# Patient Record
Sex: Female | Born: 1970 | Race: White | Hispanic: No | Marital: Married | Smoking: Current every day smoker
Health system: Southern US, Community
[De-identification: ages and names within clinical notes are randomized; demographics above are authoritative.]

## PROBLEM LIST (undated history)

## (undated) DIAGNOSIS — R911 Solitary pulmonary nodule: Secondary | ICD-10-CM

## (undated) DIAGNOSIS — R87619 Unspecified abnormal cytological findings in specimens from cervix uteri: Secondary | ICD-10-CM

## (undated) DIAGNOSIS — J45909 Unspecified asthma, uncomplicated: Secondary | ICD-10-CM

## (undated) DIAGNOSIS — F17200 Nicotine dependence, unspecified, uncomplicated: Secondary | ICD-10-CM

## (undated) DIAGNOSIS — Z0000 Encounter for general adult medical examination without abnormal findings: Secondary | ICD-10-CM

## (undated) DIAGNOSIS — Z8669 Personal history of other diseases of the nervous system and sense organs: Secondary | ICD-10-CM

## (undated) DIAGNOSIS — IMO0002 Reserved for concepts with insufficient information to code with codable children: Secondary | ICD-10-CM

## (undated) DIAGNOSIS — Z3009 Encounter for other general counseling and advice on contraception: Secondary | ICD-10-CM

## (undated) DIAGNOSIS — Z136 Encounter for screening for cardiovascular disorders: Secondary | ICD-10-CM

## (undated) DIAGNOSIS — J069 Acute upper respiratory infection, unspecified: Secondary | ICD-10-CM

## (undated) DIAGNOSIS — I2510 Atherosclerotic heart disease of native coronary artery without angina pectoris: Secondary | ICD-10-CM

## (undated) HISTORY — DX: Unspecified asthma, uncomplicated: J45.909

## (undated) HISTORY — DX: Unspecified abnormal cytological findings in specimens from cervix uteri: R87.619

## (undated) HISTORY — DX: Reserved for concepts with insufficient information to code with codable children: IMO0002

## (undated) HISTORY — DX: Nicotine dependence, unspecified, uncomplicated: F17.200

## (undated) HISTORY — DX: Acute upper respiratory infection, unspecified: J06.9

## (undated) HISTORY — DX: Encounter for other general counseling and advice on contraception: Z30.09

## (undated) HISTORY — DX: Personal history of other diseases of the nervous system and sense organs: Z86.69

## (undated) HISTORY — DX: Encounter for screening for cardiovascular disorders: Z13.6

## (undated) HISTORY — DX: Encounter for general adult medical examination without abnormal findings: Z00.00

## (undated) HISTORY — DX: Atherosclerotic heart disease of native coronary artery without angina pectoris: I25.10

---
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
## 1997-10-04 ENCOUNTER — Inpatient Hospital Stay (HOSPITAL_COMMUNITY): Admission: AD | Admit: 1997-10-04 | Discharge: 1997-10-04 | Payer: Self-pay | Admitting: Obstetrics and Gynecology

## 1997-10-16 ENCOUNTER — Inpatient Hospital Stay (HOSPITAL_COMMUNITY): Admission: AD | Admit: 1997-10-16 | Discharge: 1997-10-18 | Payer: Self-pay | Admitting: Obstetrics and Gynecology

## 2001-07-12 ENCOUNTER — Ambulatory Visit (HOSPITAL_COMMUNITY): Admission: RE | Admit: 2001-07-12 | Discharge: 2001-07-12 | Payer: Self-pay

## 2002-08-03 ENCOUNTER — Other Ambulatory Visit: Admission: RE | Admit: 2002-08-03 | Discharge: 2002-08-03 | Payer: Self-pay | Admitting: Internal Medicine

## 2004-07-10 ENCOUNTER — Ambulatory Visit: Payer: Self-pay | Admitting: Internal Medicine

## 2004-08-01 ENCOUNTER — Ambulatory Visit: Payer: Self-pay | Admitting: Neurology

## 2004-12-11 ENCOUNTER — Other Ambulatory Visit: Admission: RE | Admit: 2004-12-11 | Discharge: 2004-12-11 | Payer: Self-pay | Admitting: Obstetrics and Gynecology

## 2005-06-08 ENCOUNTER — Inpatient Hospital Stay (HOSPITAL_COMMUNITY): Admission: AD | Admit: 2005-06-08 | Discharge: 2005-06-08 | Payer: Self-pay | Admitting: Obstetrics and Gynecology

## 2005-06-10 ENCOUNTER — Inpatient Hospital Stay (HOSPITAL_COMMUNITY): Admission: AD | Admit: 2005-06-10 | Discharge: 2005-06-10 | Payer: Self-pay | Admitting: Obstetrics and Gynecology

## 2005-06-15 ENCOUNTER — Inpatient Hospital Stay (HOSPITAL_COMMUNITY): Admission: AD | Admit: 2005-06-15 | Discharge: 2005-06-15 | Payer: Self-pay | Admitting: Obstetrics and Gynecology

## 2005-07-16 ENCOUNTER — Inpatient Hospital Stay (HOSPITAL_COMMUNITY): Admission: AD | Admit: 2005-07-16 | Discharge: 2005-07-18 | Payer: Self-pay | Admitting: Obstetrics and Gynecology

## 2007-09-18 ENCOUNTER — Emergency Department: Payer: Self-pay | Admitting: Emergency Medicine

## 2010-10-11 ENCOUNTER — Other Ambulatory Visit: Payer: Self-pay | Admitting: Family Medicine

## 2010-10-11 ENCOUNTER — Other Ambulatory Visit (HOSPITAL_COMMUNITY)
Admission: RE | Admit: 2010-10-11 | Discharge: 2010-10-11 | Disposition: A | Discharge status: Home / Self Care | Payer: 59 | Source: Ambulatory Visit | Attending: Family Medicine | Admitting: Family Medicine

## 2010-10-11 ENCOUNTER — Encounter: Payer: Self-pay | Admitting: Family Medicine

## 2010-10-11 ENCOUNTER — Ambulatory Visit (INDEPENDENT_AMBULATORY_CARE_PROVIDER_SITE_OTHER): Payer: 59 | Admitting: Family Medicine

## 2010-10-11 ENCOUNTER — Telehealth: Payer: Self-pay | Admitting: Family Medicine

## 2010-10-11 DIAGNOSIS — F17200 Nicotine dependence, unspecified, uncomplicated: Secondary | ICD-10-CM | POA: Insufficient documentation

## 2010-10-11 DIAGNOSIS — Z3009 Encounter for other general counseling and advice on contraception: Secondary | ICD-10-CM

## 2010-10-11 DIAGNOSIS — Z124 Encounter for screening for malignant neoplasm of cervix: Secondary | ICD-10-CM | POA: Insufficient documentation

## 2010-10-11 DIAGNOSIS — Z136 Encounter for screening for cardiovascular disorders: Secondary | ICD-10-CM

## 2010-10-11 DIAGNOSIS — Z1159 Encounter for screening for other viral diseases: Secondary | ICD-10-CM | POA: Insufficient documentation

## 2010-10-11 DIAGNOSIS — Z0000 Encounter for general adult medical examination without abnormal findings: Secondary | ICD-10-CM

## 2010-10-11 LAB — BASIC METABOLIC PANEL
BUN: 10 mg/dL (ref 6–23)
CO2: 30 mEq/L (ref 19–32)
Calcium: 9.7 mg/dL (ref 8.4–10.5)
Chloride: 104 mEq/L (ref 96–112)
Creatinine, Ser: 0.6 mg/dL (ref 0.4–1.2)
GFR: 123.41 mL/min (ref >60.00)
Glucose, Bld: 99 mg/dL (ref 70–99)
Potassium: 4.6 mEq/L (ref 3.5–5.1)
Sodium: 139 mEq/L (ref 135–145)

## 2010-10-11 LAB — HM PAP SMEAR

## 2010-10-11 LAB — LIPID PANEL
Cholesterol: 174 mg/dL (ref 0–200)
HDL: 53.4 mg/dL (ref >39.00)
LDL Cholesterol: 105 mg/dL — ABNORMAL HIGH (ref 0–99)
Total CHOL/HDL Ratio: 3
Triglycerides: 76 mg/dL (ref 0.0–149.0)
VLDL: 15.2 mg/dL (ref 0.0–40.0)

## 2010-10-11 LAB — CONVERTED CEMR LAB: Beta hcg, urine, semiquantitative: NEGATIVE

## 2010-10-15 NOTE — Assessment & Plan Note (Signed)
Summary: NEW PT TO EST/PAP SMEAR/CPX/CLE  [employer],MAILED NPP   Vital Signs:  Patient profile:   40 year old female Height:      69 inches Weight:      140.50 pounds BMI:     20.82 Temp:     97.8 degrees F oral Pulse rate:   68 / minute Pulse rhythm:   regular BP sitting:   110 / 70  (right arm) Cuff size:   regular  Vitals Entered By: Linde Gillis CMA Duncan Dull) (October 11, 2010 11:03 AM) CC: new patient, pap, depo shot   History of Present Illness: 40 yo here to establish care and for CPX/Pap.  G3P3, says she has had multiple abormal pap smears in past, s/p LEEP in 2005.  Never followed up after birth of her last child 5 years ago.  Contraception management- wants Depo provera.  Pt smokes 1 pack of Marlboro lights per day. has tried quitting past, never been successful. Tried patches/gums- made her nauseated. Says that Chantix made her violent and Zyban made her anxious.   Preventive Screening-Counseling & Management  Alcohol-Tobacco     Smoking Status: current      Drug Use:  no.    Current Medications (verified): 1)  None  Allergies (verified): No Known Drug Allergies  Past History:  Family History: Last updated: 10/11/2010 Family History High cholesterol  Social History: Last updated: 10/11/2010 Married Current Smoker Alcohol use-no Drug use-no 3 children  Risk Factors: Smoking Status: current (10/11/2010)  Past Medical History: abnormal pap smear- requiring LEEP in 2005  Past Surgical History: Denies surgical history  Family History: Family History High cholesterol  Social History: Married Current Smoker Alcohol use-no Drug use-no 3 childrenSmoking Status:  current Drug Use:  no  Review of Systems      See HPI General:  Denies malaise. Eyes:  Denies blurring. ENT:  Denies difficulty swallowing. CV:  Denies chest pain or discomfort. Resp:  Denies shortness of breath. GI:  Denies abdominal pain, bloody stools, and change in bowel  habits. GU:  Denies abnormal vaginal bleeding, discharge, and dysuria. MS:  Denies joint pain, joint redness, and joint swelling. Derm:  Denies rash. Neuro:  Denies headaches. Psych:  Denies anxiety and depression. Endo:  Denies cold intolerance and heat intolerance. Heme:  Denies abnormal bruising and bleeding.  Physical Exam  General:  alert, well-developed, and well-nourished.   Head:  normocephalic and no abnormalities observed.   Eyes:  vision grossly intact, pupils equal, pupils round, and pupils reactive to light.   Ears:  R ear normal and L ear normal.   Nose:  no external deformity.   Mouth:  pharynx pink and moist.   Neck:  No deformities, masses, or tenderness noted. Breasts:  No mass, nodules, thickening, tenderness, bulging, retraction, inflamation, nipple discharge or skin changes noted.   Lungs:  Normal respiratory effort, chest expands symmetrically. Lungs are clear to auscultation, no crackles or wheezes. Heart:  Normal rate and regular rhythm. S1 and S2 normal without gallop, murmur, click, rub or other extra sounds. Abdomen:  Bowel sounds positive,abdomen soft and non-tender without masses, organomegaly or hernias noted. Rectal:  no external abnormalities.   Genitalia:  Pelvic Exam:        External: normal female genitalia without lesions or masses        Vagina: normal without lesions or masses        Cervix: normal without lesions or masses        Adnexa:  normal bimanual exam without masses or fullness        Uterus: normal by palpation        Pap smear: performed Msk:  No deformity or scoliosis noted of thoracic or lumbar spine.   Extremities:  no edema Neurologic:  alert & oriented X3 and gait normal.   Skin:  Intact without suspicious lesions or rashes Psych:  Cognition and judgment appear intact. Alert and cooperative with normal attention span and concentration. No apparent delusions, illusions, hallucinations   Impression & Recommendations:  Problem #  1:  HEALTH MAINTENANCE EXAM (ICD-V70.0) Reviewed preventive care protocols, scheduled due services, and updated immunizations Discussed nutrition, exercise, diet, and healthy lifestyle.  Pap smear today. BMET, FLP today. Orders: TLB-BMP (Basic Metabolic Panel-BMET) (80048-METABOL)  Problem # 2:  CONTRACEPTIVE MANAGEMENT (ICD-V25.09) Assessment: Unchanged Discussed risks of hormonal therapy in a smoker above the age of 44.  Pt is willing to take risk of DVT/thromboembolisms.  I explained to her that I will give her one dose of Depo today but none beyond that due to risk.  Will refer to GYN for non hormonal therapy, such as paraguard, BTL. Orders: Urine Pregnancy Test  [id number]) Gynecologic Referral (Gyn)  Other Orders: Venipuncture (60454) TLB-Lipid Panel (80061-LIPID)  Patient Instructions: 1)  Please stop by to see Shirlee Limerick on your way out to set up your gynecology referral.   Orders Added: 1)  Urine Pregnancy Test  [81025] 2)  Venipuncture [36415] 3)  TLB-Lipid Panel [80061-LIPID] 4)  TLB-BMP (Basic Metabolic Panel-BMET) [80048-METABOL] 5)  Gynecologic Referral [Gyn] 6)  New Patient 18-39 years [99385]    Prior Medications (reviewed today): None Current Allergies (reviewed today): No known allergies   Laboratory Results   Urine Tests      Urine HCG: negative    Appended Document: NEW PT TO EST/PAP SMEAR/CPX/CLE  [employer],MAILED NPP     Allergies: No Known Drug Allergies   Other Orders: Depo-Provera 150mg  (U9811) Admin of Therapeutic Inj  intramuscular or subcutaneous (91478)   Medication Administration  Injection # 1:    Medication: Depo-Provera 150mg     Diagnosis: CONTRACEPTIVE MANAGEMENT (ICD-V25.09)    Route: IM    Site: RUOQ gluteus    Exp Date: 10/02/2012    Lot #: [id number]    Mfr: Francisca December    Patient tolerated injection without complications    Given by: Linde Gillis CMA Duncan Dull) (October 11, 2010 11:29 AM)  Orders Added: 1)  Depo-Provera  150mg  [J1055] 2)  Admin of Therapeutic Inj  intramuscular or subcutaneous [96372]     Current Allergies: No known allergies

## 2010-10-16 ENCOUNTER — Encounter: Payer: Self-pay | Admitting: Family Medicine

## 2010-10-22 NOTE — Progress Notes (Signed)
Summary: ? reaction to injection  Phone Note Call from Patient Call back at Home Phone [phone]   Caller: Patient Summary of Call: Pt was given depo shot today around lunch time and about 30 minutes ago her chest broke out and started itching badly.  She is asking if this could be a reaction to the shot.  She says the area is in the middle of her chest, about the size of a golfball.  Please advise.         Lowella Petties CMA, AAMA  October 11, 2010 4:49 PM   Follow-up for Phone Call        could be... but doubt.  if any nausea/vomiting, SOB or throat swelling, should seek urgent medical care.  doubt that will happen though.  could try some benadryl for itch. Follow-up by: Eustaquio Boyden  MD,  October 11, 2010 5:09 PM  Additional Follow-up for Phone Call Additional follow up Details #1::        Tried to call pt, got voicemail, didnt leave a message because it's saturday and the office is closed.    Lowella Petties CMA, AAMA  October 12, 2010 11:43 AM  Pt states she is fine now, took an allergy pill friday evening and the rash went away. Additional Follow-up by: Lowella Petties CMA, AAMA,  October 14, 2010 10:54 AM

## 2010-10-22 NOTE — Letter (Signed)
Summary: Results Follow up Letter  [place] [facility]  [address] [place], [place] [address]   Phone: [phone]  Fax: [phone]    10/16/2010 MRN: [id number]  [facility] [address] [place], [place]  [address]  [place]  Dear Ms. Mimbs,  The following are the results of your recent test(s):  Test         Result    Pap Smear:        Normal __X___  Not Normal _____ Comments: ______________________________________________________ Cholesterol: LDL(Bad cholesterol):         Your goal is less than:         HDL (Good cholesterol):       Your goal is more than: Comments:  ______________________________________________________ Mammogram:        Normal _____  Not Normal _____ Comments:  ___________________________________________________________________ Hemoccult:        Normal _____  Not normal _______ Comments:    _____________________________________________________________________ Other Tests:    We routinely do not discuss normal results over the telephone.  If you desire a copy of the results, or you have any questions about this information we can discuss them at your next office visit.   Sincerely,       Dr. Ruthe Mannan

## 2010-10-24 ENCOUNTER — Ambulatory Visit: Payer: 59 | Admitting: Obstetrics & Gynecology

## 2010-11-05 ENCOUNTER — Ambulatory Visit: Payer: 59 | Admitting: Obstetrics and Gynecology

## 2010-11-05 DIAGNOSIS — Z3009 Encounter for other general counseling and advice on contraception: Secondary | ICD-10-CM

## 2010-11-06 NOTE — Assessment & Plan Note (Signed)
NAMEJAYSA, Makayla NO.:  [id number]  MEDICAL RECORD NO.:  [id number]           PATIENT TYPE:  O  LOCATION:  [facility] at [facility]         FACILITY:  [facility]  PHYSICIAN:  Tinnie Gens, MD        DATE OF BIRTH:  09/28/1970  DATE OF SERVICE:  11/05/2010                                 CLINIC NOTE  CHIEF COMPLAINT:  Birth control consult.  HISTORY OF PRESENT ILLNESS:  The patient is a 40 year old gravida 3, para 3, who is referred from Dr. Ruthe Mannan for birth control consult. The patient has previously been on Depo-Provera 8 years prior to the birth of her last son, which was 5 years ago.  Since that time, she has really been doing nothing for birth control.  She went to see Dr. Dayton Martes for a yearly exam, and she had a Pap smear that was normal and was referred here because she is over 71 and a smoker for birth control consultation.  She did receive a Depo-Provera shot at the time of her visit there.  When she is on Depo, she has regular cycles that come monthly with medium flow.  She gets no cycle when she is on Depo.  PAST MEDICAL HISTORY:  Asthma, mild.  PAST SURGICAL HISTORY:  Negative.  MEDICATIONS:  Depo-Provera 150 mg IM q.3 months.  ALLERGIES:  PENICILLIN.  OBSTETRICAL HISTORY:  G3, P3, three vaginal deliveries.  GYN HISTORY:  History of abnormal Pap with LEEP in 1995, three normal pregnancies after that, and the last Pap was normal.  FAMILY HISTORY:  Negative for diabetes; heart disease; hypertension; breast, ovarian, or uterine cancer.  SOCIAL HISTORY:  The patient is a homemaker.  She has three kids and her husband at home.  She does smoke approximately 1 pack per day for the last 20 years.  She denies other alcohol or drug use.  REVIEW OF SYSTEMS:  A 14-point review of systems reviewed.  Please see GYN history in the chart, but is negative.  PHYSICAL EXAMINATION:  VITAL SIGNS:  Today, vitals are as noted in the chart. GENERAL:  The patient is a  well-developed and well-nourished female in no acute distress. ABDOMEN:  Soft, nontender, and nondistended. HEENT:  Normocephalic and atraumatic.  Sclerae are without icterus.  She has normal respiratory rise and fall. HEART:  Rate is regular. EXTREMITIES:  No cyanosis, clubbing, or edema.  IMPRESSION:  Birth control consult on a 40 year old smoker.  PLAN:  Lengthy discussion was had with the patient as well up to date and CDC, MMWR.  There is no contraindication to be ever 36 smoking and Depo-Provera.  The patient likes just birth control method tolerates it well.  We will continue to offer this to the patient.  The patient does have contraindications to combine oral contraceptives and would only have contraindication to Depo-Provera or progestin only containing birth control options because she had a personal history of breast cancer or had a significant cardiovascular history or significant three more risk factors for cardiac disease including age, sex, smoking, diabetes, and hypertension.  This patient does not have any of these issues, and so Depo should be fine for her.  If she likes  it, we will continue this for her.          ______________________________ Tinnie Gens, MD    TP/MEDQ  D:  11/05/2010  T:  11/06/2010  Job:  [id number]  cc:   Dianne Dun, MD [facility]

## 2011-01-01 ENCOUNTER — Ambulatory Visit: Payer: 59

## 2011-01-01 ENCOUNTER — Ambulatory Visit (INDEPENDENT_AMBULATORY_CARE_PROVIDER_SITE_OTHER): Payer: 59 | Admitting: Family Medicine

## 2011-01-01 DIAGNOSIS — Z309 Encounter for contraceptive management, unspecified: Secondary | ICD-10-CM

## 2011-01-01 MED ORDER — MEDROXYPROGESTERONE ACETATE 150 MG/ML IM SUSP
150.0000 mg | Freq: Once | INTRAMUSCULAR | Status: AC
  Administered 2011-01-01: 150 mg via INTRAMUSCULAR

## 2011-01-01 NOTE — Progress Notes (Signed)
Depo provera injection given today during nurse visit.

## 2011-03-20 ENCOUNTER — Ambulatory Visit (INDEPENDENT_AMBULATORY_CARE_PROVIDER_SITE_OTHER): Payer: 59 | Admitting: *Deleted

## 2011-03-20 DIAGNOSIS — Z309 Encounter for contraceptive management, unspecified: Secondary | ICD-10-CM

## 2011-03-20 MED ORDER — MEDROXYPROGESTERONE ACETATE 150 MG/ML IM SUSP
150.0000 mg | Freq: Once | INTRAMUSCULAR | Status: AC
  Administered 2011-03-20: 150 mg via INTRAMUSCULAR

## 2011-03-20 NOTE — Progress Notes (Signed)
  Subjective:    Patient ID: Makayla Green, female    DOB: 01-Feb-1971, 40 y.o.   MRN: [id number]  HPI    Review of Systems     Objective:   Physical Exam        Assessment & Plan:  Patient received depo shot at nurse visit today

## 2011-06-09 ENCOUNTER — Ambulatory Visit (INDEPENDENT_AMBULATORY_CARE_PROVIDER_SITE_OTHER): Payer: 59 | Admitting: *Deleted

## 2011-06-09 DIAGNOSIS — Z309 Encounter for contraceptive management, unspecified: Secondary | ICD-10-CM

## 2011-06-09 MED ORDER — MEDROXYPROGESTERONE ACETATE 150 MG/ML IM SUSP
150.0000 mg | Freq: Once | INTRAMUSCULAR | Status: AC
  Administered 2011-06-09: 150 mg via INTRAMUSCULAR

## 2011-06-09 NOTE — Progress Notes (Signed)
Depo provera given during nurse visit today.  Patient should return between 08/25/2011-09/08/2011 for next injection.

## 2011-06-10 ENCOUNTER — Ambulatory Visit: Payer: 59

## 2012-06-24 ENCOUNTER — Encounter: Payer: Self-pay | Admitting: Family Medicine

## 2012-06-28 ENCOUNTER — Ambulatory Visit (INDEPENDENT_AMBULATORY_CARE_PROVIDER_SITE_OTHER): Payer: 59 | Admitting: Family Medicine

## 2012-06-28 ENCOUNTER — Ambulatory Visit (INDEPENDENT_AMBULATORY_CARE_PROVIDER_SITE_OTHER)
Admission: RE | Admit: 2012-06-28 | Discharge: 2012-06-28 | Disposition: A | Discharge status: Home / Self Care | Payer: 59 | Source: Ambulatory Visit | Attending: Family Medicine | Admitting: Family Medicine

## 2012-06-28 ENCOUNTER — Encounter: Payer: Self-pay | Admitting: Family Medicine

## 2012-06-28 VITALS — BP 104/64 | HR 72 | Temp 98.0°F | Wt 142.0 lb

## 2012-06-28 DIAGNOSIS — M25559 Pain in unspecified hip: Secondary | ICD-10-CM

## 2012-06-28 DIAGNOSIS — Z309 Encounter for contraceptive management, unspecified: Secondary | ICD-10-CM

## 2012-06-28 DIAGNOSIS — M25551 Pain in right hip: Secondary | ICD-10-CM

## 2012-06-28 DIAGNOSIS — Z23 Encounter for immunization: Secondary | ICD-10-CM

## 2012-06-28 MED ORDER — MEDROXYPROGESTERONE ACETATE 150 MG/ML IM SUSP
150.0000 mg | Freq: Once | INTRAMUSCULAR | Status: AC
  Administered 2012-06-28: 150 mg via INTRAMUSCULAR

## 2012-06-28 NOTE — Patient Instructions (Addendum)
Good to see you. We will call you with your xray results.   Aleve and/or tylenol as needed for pain. Hip injection may be beneficial to help with pain and to decrease inflammation. Start physical therapy with transition to home exercise program.

## 2012-06-28 NOTE — Progress Notes (Signed)
  Subjective:    Patient ID: Makayla Green, female    DOB: Dec 23, 1970, 41 y.o.   MRN: [id number]  HPI  41 yo here to discuss right hip pain on and off for several years.  No known injury but has had this issue since she gave birth to her son several years ago.  She can reproduce pain by pressing on outside of her hip.  Sometimes radiates down her leg, typically does not.  No LE weakness.  She would also like to restart Depo provera today.  Patient Active Problem List  Diagnosis  . TOBACCO ABUSE  . Right hip pain   Past Medical History  Diagnosis Date  . Other general counseling and advice for contraceptive management   . Routine general medical examination at a health care facility   . Screening for ischemic heart disease   . Tobacco use disorder   . Abnormal pap     LEEP 2005   No past surgical history on file. History  Substance Use Topics  . Smoking status: Current Every Day Smoker  . Smokeless tobacco: Not on file  . Alcohol Use: No   No family history on file. Allergies  Allergen Reactions  . Penicillins Shortness Of Breath and Swelling   No current outpatient prescriptions on file prior to visit.   No current facility-administered medications on file prior to visit.   The PMH, PSH, Social History, Family History, Medications, and allergies have been reviewed in [facility], and have been updated if relevant.   Review of Systems See HPI    Objective:   Physical Exam BP 104/64  Pulse 72  Temp 98 F (36.7 C)  Wt 142 lb (64.411 kg)  LMP 06/14/2012  General:  Well-developed,well-nourished,in no acute distress; alert,appropriate and cooperative throughout examination Head:  normocephalic and atraumatic.   Extremities:  No clubbing, cyanosis, edema, or deformity noted with normal full range of motion of all joints.   No pain with external or internal rotation of right hip, SLR neg bilaterally. She is TTP over right trochanteric bursa Neurologic:  alert &  oriented X3 and gait normal.   Skin:  Intact without suspicious lesions or rashes Psych:  Cognition and judgment appear intact. Alert and cooperative with normal attention span and concentration. No apparent delusions, illusions, hallucinations      Assessment & Plan:   1. Right hip pain  Intermittent- consistent with trochanteric bursitis.  Will get hip film today. Given handout from sports med advisor discussing course, expected duration and treatment.  She is not interested in cortisone injections.   DG Hip Complete Right  2. Contraceptive management  She is a smoker- no contraindication to progesterone only hormones in smokers above 40 yo, so I am ok with her continuing Depo but explained why she would not be a candidate for combination hormonal therapy (CV risks.) The patient indicates understanding of these issues and agrees with the plan.  POCT urine pregnancy, medroxyPROGESTERone (DEPO-PROVERA) injection 150 mg  3. Need for prophylactic vaccination and inoculation against influenza  Flu vaccine greater than or equal to 3yo preservative free IM

## 2012-10-05 ENCOUNTER — Ambulatory Visit: Payer: 59

## 2013-02-15 ENCOUNTER — Ambulatory Visit: Payer: 59

## 2013-02-18 ENCOUNTER — Ambulatory Visit: Payer: 59 | Admitting: Family Medicine

## 2013-02-22 ENCOUNTER — Ambulatory Visit: Payer: 59 | Admitting: Family Medicine

## 2013-02-24 ENCOUNTER — Encounter: Payer: Self-pay | Admitting: Family Medicine

## 2013-02-24 ENCOUNTER — Ambulatory Visit (INDEPENDENT_AMBULATORY_CARE_PROVIDER_SITE_OTHER): Payer: 59 | Admitting: Family Medicine

## 2013-02-24 VITALS — BP 100/80 | HR 68 | Temp 98.0°F | Ht 69.0 in | Wt 141.0 lb

## 2013-02-24 DIAGNOSIS — Z309 Encounter for contraceptive management, unspecified: Secondary | ICD-10-CM | POA: Insufficient documentation

## 2013-02-24 LAB — POCT URINE PREGNANCY: Preg Test, Ur: NEGATIVE

## 2013-02-24 MED ORDER — MEDROXYPROGESTERONE ACETATE 150 MG/ML IM SUSP
150.0000 mg | Freq: Once | INTRAMUSCULAR | Status: AC
  Administered 2013-02-24: 150 mg via INTRAMUSCULAR

## 2013-02-24 NOTE — Progress Notes (Signed)
  Subjective:    Patient ID: Makayla Green, female    DOB: 1971/03/26, 42 y.o.   MRN: [id number]  HPI 42 yo here to discuss contraception.  She was receiving depo provera injections (she is a smoker) but had not come back since her last injection in 06/2012.  She had no periods while she was receiving the injections, no noticeable side effects.  She would like to restart it.  Last pap smear normal in 10/2010.  Patient Active Problem List   Diagnosis Date Noted  . Contraception management 02/24/2013  . TOBACCO ABUSE 10/11/2010   Past Medical History  Diagnosis Date  . Other general counseling and advice for contraceptive management   . Routine general medical examination at a health care facility   . Screening for ischemic heart disease   . Tobacco use disorder   . Abnormal pap     LEEP 2005   No past surgical history on file. History  Substance Use Topics  . Smoking status: Current Every Day Smoker  . Smokeless tobacco: Not on file  . Alcohol Use: No   No family history on file. Allergies  Allergen Reactions  . Penicillins Shortness Of Breath and Swelling   No current outpatient prescriptions on file prior to visit.   No current facility-administered medications on file prior to visit.   The PMH, PSH, Social History, Family History, Medications, and allergies have been reviewed in [facility], and have been updated if relevant.    Review of Systems    See HPI Objective:   Physical Exam BP 100/80  Pulse 68  Temp(Src) 98 F (36.7 C)  Ht 5\' 9"  (1.753 m)  Wt 141 lb (63.957 kg)  BMI 20.81 kg/m2  General:  Well-developed,well-nourished,in no acute distress; alert,appropriate and cooperative throughout examination Head:  normocephalic and atraumatic.   Eyes:  vision grossly intact, pupils equal, pupils round, and pupils reactive to light.   Ears:  R ear normal and L ear normal.   Nose:  no external deformity.   Mouth:  good dentition.   Neck:  No deformities,  masses, or tenderness noted Neurologic:  alert & oriented X3 and gait normal.   Skin:  Intact without suspicious lesions or rashes Psych:  Cognition and judgment appear intact. Alert and cooperative with normal attention span and concentration. No apparent delusions, illusions, hallucinations      Assessment & Plan:  1. Contraception management U preg neg. Depo provera IM given today.

## 2013-03-31 ENCOUNTER — Ambulatory Visit: Payer: 59 | Admitting: Family Medicine

## 2013-05-12 ENCOUNTER — Ambulatory Visit (INDEPENDENT_AMBULATORY_CARE_PROVIDER_SITE_OTHER): Payer: 59 | Admitting: *Deleted

## 2013-05-12 DIAGNOSIS — Z309 Encounter for contraceptive management, unspecified: Secondary | ICD-10-CM

## 2013-05-12 DIAGNOSIS — IMO0001 Reserved for inherently not codable concepts without codable children: Secondary | ICD-10-CM

## 2013-05-12 MED ORDER — MEDROXYPROGESTERONE ACETATE 150 MG/ML IM SUSP
150.0000 mg | Freq: Once | INTRAMUSCULAR | Status: AC
  Administered 2013-05-12: 150 mg via INTRAMUSCULAR

## 2013-08-12 ENCOUNTER — Ambulatory Visit (INDEPENDENT_AMBULATORY_CARE_PROVIDER_SITE_OTHER): Payer: 59

## 2013-08-12 DIAGNOSIS — Z309 Encounter for contraceptive management, unspecified: Secondary | ICD-10-CM

## 2013-08-12 LAB — POCT URINE PREGNANCY: Preg Test, Ur: NEGATIVE

## 2013-08-26 ENCOUNTER — Ambulatory Visit (INDEPENDENT_AMBULATORY_CARE_PROVIDER_SITE_OTHER): Payer: 59

## 2013-08-26 DIAGNOSIS — Z309 Encounter for contraceptive management, unspecified: Secondary | ICD-10-CM

## 2013-08-26 MED ORDER — MEDROXYPROGESTERONE ACETATE 150 MG/ML IM SUSP
150.0000 mg | Freq: Once | INTRAMUSCULAR | Status: AC
  Administered 2013-08-26: 150 mg via INTRAMUSCULAR

## 2013-11-15 ENCOUNTER — Ambulatory Visit: Payer: 59

## 2013-11-15 ENCOUNTER — Ambulatory Visit (INDEPENDENT_AMBULATORY_CARE_PROVIDER_SITE_OTHER): Payer: 59 | Admitting: *Deleted

## 2013-11-15 DIAGNOSIS — Z309 Encounter for contraceptive management, unspecified: Secondary | ICD-10-CM

## 2013-11-15 MED ORDER — MEDROXYPROGESTERONE ACETATE 150 MG/ML IM SUSP
150.0000 mg | Freq: Once | INTRAMUSCULAR | Status: AC
  Administered 2013-11-15: 150 mg via INTRAMUSCULAR

## 2014-02-02 ENCOUNTER — Ambulatory Visit (INDEPENDENT_AMBULATORY_CARE_PROVIDER_SITE_OTHER): Payer: 59

## 2014-02-02 DIAGNOSIS — Z309 Encounter for contraceptive management, unspecified: Secondary | ICD-10-CM

## 2014-02-02 MED ORDER — MEDROXYPROGESTERONE ACETATE 150 MG/ML IM SUSP
150.0000 mg | Freq: Once | INTRAMUSCULAR | Status: AC
  Administered 2014-02-02: 150 mg via INTRAMUSCULAR

## 2014-03-20 ENCOUNTER — Ambulatory Visit (INDEPENDENT_AMBULATORY_CARE_PROVIDER_SITE_OTHER)
Admission: RE | Admit: 2014-03-20 | Discharge: 2014-03-20 | Disposition: A | Discharge status: Home / Self Care | Payer: 59 | Source: Ambulatory Visit | Attending: Family Medicine | Admitting: Family Medicine

## 2014-03-20 ENCOUNTER — Encounter: Payer: Self-pay | Admitting: Family Medicine

## 2014-03-20 ENCOUNTER — Ambulatory Visit (INDEPENDENT_AMBULATORY_CARE_PROVIDER_SITE_OTHER): Payer: 59 | Admitting: Family Medicine

## 2014-03-20 VITALS — BP 106/62 | HR 88 | Temp 98.2°F | Ht 67.0 in | Wt 140.5 lb

## 2014-03-20 DIAGNOSIS — R61 Generalized hyperhidrosis: Secondary | ICD-10-CM | POA: Insufficient documentation

## 2014-03-20 DIAGNOSIS — F17200 Nicotine dependence, unspecified, uncomplicated: Secondary | ICD-10-CM

## 2014-03-20 LAB — CBC WITH DIFFERENTIAL/PLATELET
BASOS PCT: 0.4 % (ref 0.0–3.0)
Basophils Absolute: 0.1 10*3/uL (ref 0.0–0.1)
Eosinophils Absolute: 0.1 10*3/uL (ref 0.0–0.7)
Eosinophils Relative: 0.5 % (ref 0.0–5.0)
HCT: 44.9 % (ref 36.0–46.0)
HEMOGLOBIN: 14.9 g/dL (ref 12.0–15.0)
LYMPHS PCT: 16.6 % (ref 12.0–46.0)
Lymphs Abs: 2.8 10*3/uL (ref 0.7–4.0)
MCHC: 33.1 g/dL (ref 30.0–36.0)
MCV: 95.8 fl (ref 78.0–100.0)
Monocytes Absolute: 0.9 10*3/uL (ref 0.1–1.0)
Monocytes Relative: 5.5 % (ref 3.0–12.0)
NEUTROS PCT: 77 % (ref 43.0–77.0)
Neutro Abs: 12.9 10*3/uL — ABNORMAL HIGH (ref 1.4–7.7)
Platelets: 373 10*3/uL (ref 150.0–400.0)
RBC: 4.69 Mil/uL (ref 3.87–5.11)
RDW: 13.6 % (ref 11.5–15.5)
WBC: 16.7 10*3/uL — ABNORMAL HIGH (ref 4.0–10.5)

## 2014-03-20 LAB — T4, FREE: Free T4: 1.29 ng/dL (ref 0.60–1.60)

## 2014-03-20 LAB — TSH: TSH: 0.34 u[IU]/mL — ABNORMAL LOW (ref 0.35–4.50)

## 2014-03-20 LAB — HEMOGLOBIN A1C: HEMOGLOBIN A1C: 6.1 % (ref 4.6–6.5)

## 2014-03-20 NOTE — Progress Notes (Signed)
   Subjective:   Patient ID: Makayla Green, female    DOB: [birth year], 43 y.o.   MRN: [id number]  Makayla Green is a pleasant 43 y.o. year old female whom I have not seen in over a year, presents to clinic today with "not feeling right" and Hot Flashes and Night Sweats  on 03/20/2014  HPI:  Past month, she has been waking up in the middle of the night drenched in sweat several nights per week.  Has been receiving IM depo provera q 3 months for contraception (she is a smoker).  Not having periods since restarting this.  Last dose received here on 02/02/14.  Has a cough in the morning but this is unchanged.  No hemoptysis.  Did report left sided peripheral blurred vision last week but that resolved.  Appetite good. Wt Readings from Last 3 Encounters:  03/20/14 140 lb 8 oz (63.73 kg)  02/24/13 141 lb (63.957 kg)  06/28/12 142 lb (64.411 kg)   No fevers.  Of note, father was exposed to agent orange and she is concerned about this.  Current Outpatient Prescriptions on File Prior to Visit  Medication Sig Dispense Refill  . fexofenadine-pseudoephedrine (ALLEGRA-D 24) 180-240 MG per 24 hr tablet Take 1 tablet by mouth daily.       No current facility-administered medications on file prior to visit.    Allergies  Allergen Reactions  . Penicillins Shortness Of Breath and Swelling    Past Medical History  Diagnosis Date  . Other general counseling and advice for contraceptive management   . Routine general medical examination at a health care facility   . Screening for ischemic heart disease   . Tobacco use disorder   . Abnormal pap     LEEP 2005    No past surgical history on file.  No family history on file.  History   Social History  . Marital Status: Single    Spouse Name: N/A    Number of Children: 3  . Years of Education: N/A   Occupational History  . Not on file.   Social History Main Topics  . Smoking status: Current Every Day Smoker  . Smokeless  tobacco: Not on file  . Alcohol Use: No  . Drug Use: No  . Sexual Activity: Not on file   Other Topics Concern  . Not on file   Social History Narrative  . No narrative on file   The PMH, PSH, Social History, Family History, Medications, and allergies [facility], and have been updated if relevant.   Review of Systems + tingling in hands No nausea or vomiting No day time hot flashes No headaches No extremity weakness +intermittent resting tremor (bilateral) No increased thirst or urination No CP or SOB No blood in stool + more "moodiness"  Denies feeling depressed or anxious    Objective:    BP 106/62  Pulse 88  Temp(Src) 98.2 F (36.8 C) (Oral)  Ht 5\' 7"  (1.702 m)  Wt 140 lb 8 oz (63.73 kg)  BMI 22.00 kg/m2  SpO2 98%   Physical Exam  Gen:  Alert, NAD HEENT:  Mild thyromegaly Resp:CTA bilaterally  CVS: RRR Abd: soft, NT Neuro:  +resting tremor, right > left  Psych:  Good eye contact, not anxious or depressed appearing Lymph:  No cervical lymphadenopathy      Assessment & Plan:   Night sweats No Follow-up on file.

## 2014-03-20 NOTE — Assessment & Plan Note (Signed)
New- unclear etiology with very wide differential diagnosis. She is having some symptoms of hyperthyroidism.  Will route out thyroid dysfunction with TSH and FT4 today. Also would like to rule out diabetes and malignancy. CBC, a1c and CXR given long term tobacco use. The patient indicates understanding of these issues and agrees with the plan.  Orders Placed This Encounter  Procedures  . DG Chest 2 View  . TSH  . CBC with Differential  . T4, Free  . Hemoglobin A1c  . Comprehensive metabolic panel

## 2014-03-20 NOTE — Patient Instructions (Signed)
Good to see you. I will call you with your results from today. Please let me know if you develop any new symptoms.

## 2014-03-20 NOTE — Progress Notes (Signed)
Pre visit review using our clinic review tool, if applicable. No additional management support is needed unless otherwise documented below in the visit note. 

## 2014-03-21 ENCOUNTER — Encounter: Payer: Self-pay | Admitting: Family Medicine

## 2014-03-21 LAB — COMPREHENSIVE METABOLIC PANEL
ALT: 12 U/L (ref 0–35)
AST: 21 U/L (ref 0–37)
Albumin: 4.4 g/dL (ref 3.5–5.2)
Alkaline Phosphatase: 61 U/L (ref 39–117)
BUN: 11 mg/dL (ref 6–23)
CALCIUM: 9.8 mg/dL (ref 8.4–10.5)
CHLORIDE: 107 meq/L (ref 96–112)
CO2: 17 meq/L — AB (ref 19–32)
Creatinine, Ser: 0.9 mg/dL (ref 0.4–1.2)
GFR: 72.12 mL/min (ref >60.00)
Glucose, Bld: 92 mg/dL (ref 70–99)
Potassium: 5.1 mEq/L (ref 3.5–5.1)
SODIUM: 142 meq/L (ref 135–145)
TOTAL PROTEIN: 7.8 g/dL (ref 6.0–8.3)
Total Bilirubin: 0.5 mg/dL (ref 0.2–1.2)

## 2014-03-23 ENCOUNTER — Encounter: Payer: Self-pay | Admitting: Family Medicine

## 2014-03-31 ENCOUNTER — Other Ambulatory Visit: Payer: Self-pay | Admitting: Family Medicine

## 2014-03-31 DIAGNOSIS — D72829 Elevated white blood cell count, unspecified: Secondary | ICD-10-CM

## 2014-04-07 ENCOUNTER — Other Ambulatory Visit (INDEPENDENT_AMBULATORY_CARE_PROVIDER_SITE_OTHER): Payer: 59

## 2014-04-07 DIAGNOSIS — D72829 Elevated white blood cell count, unspecified: Secondary | ICD-10-CM

## 2014-04-07 LAB — CBC WITH DIFFERENTIAL/PLATELET
BASOS ABS: 0.1 10*3/uL (ref 0.0–0.1)
Basophils Relative: 0.4 % (ref 0.0–3.0)
Eosinophils Absolute: 0.1 10*3/uL (ref 0.0–0.7)
Eosinophils Relative: 0.7 % (ref 0.0–5.0)
HCT: 42 % (ref 36.0–46.0)
HEMOGLOBIN: 14.3 g/dL (ref 12.0–15.0)
Lymphocytes Relative: 26.1 % (ref 12.0–46.0)
Lymphs Abs: 3.1 10*3/uL (ref 0.7–4.0)
MCHC: 34.1 g/dL (ref 30.0–36.0)
MCV: 93.4 fl (ref 78.0–100.0)
MONOS PCT: 6.5 % (ref 3.0–12.0)
Monocytes Absolute: 0.8 10*3/uL (ref 0.1–1.0)
NEUTROS ABS: 7.8 10*3/uL — AB (ref 1.4–7.7)
Neutrophils Relative %: 66.3 % (ref 43.0–77.0)
PLATELETS: 329 10*3/uL (ref 150.0–400.0)
RBC: 4.5 Mil/uL (ref 3.87–5.11)
RDW: 13.6 % (ref 11.5–15.5)
WBC: 11.7 10*3/uL — ABNORMAL HIGH (ref 4.0–10.5)

## 2014-04-20 ENCOUNTER — Encounter: Payer: Self-pay | Admitting: Family Medicine

## 2014-04-20 ENCOUNTER — Ambulatory Visit (INDEPENDENT_AMBULATORY_CARE_PROVIDER_SITE_OTHER): Payer: 59 | Admitting: Family Medicine

## 2014-04-20 ENCOUNTER — Ambulatory Visit: Payer: 59

## 2014-04-20 VITALS — BP 110/68 | HR 99 | Temp 98.2°F | Wt 143.5 lb

## 2014-04-20 DIAGNOSIS — R7989 Other specified abnormal findings of blood chemistry: Secondary | ICD-10-CM

## 2014-04-20 DIAGNOSIS — E0590 Thyrotoxicosis, unspecified without thyrotoxic crisis or storm: Secondary | ICD-10-CM | POA: Insufficient documentation

## 2014-04-20 DIAGNOSIS — R946 Abnormal results of thyroid function studies: Secondary | ICD-10-CM

## 2014-04-20 DIAGNOSIS — Z308 Encounter for other contraceptive management: Secondary | ICD-10-CM

## 2014-04-20 DIAGNOSIS — R61 Generalized hyperhidrosis: Secondary | ICD-10-CM

## 2014-04-20 DIAGNOSIS — D72829 Elevated white blood cell count, unspecified: Secondary | ICD-10-CM

## 2014-04-20 LAB — CBC WITH DIFFERENTIAL/PLATELET
Basophils Absolute: 0 10*3/uL (ref 0.0–0.1)
Basophils Relative: 0.2 % (ref 0.0–3.0)
EOS PCT: 0.7 % (ref 0.0–5.0)
Eosinophils Absolute: 0.1 10*3/uL (ref 0.0–0.7)
HCT: 46.4 % — ABNORMAL HIGH (ref 36.0–46.0)
HEMOGLOBIN: 15.6 g/dL — AB (ref 12.0–15.0)
LYMPHS ABS: 3.6 10*3/uL (ref 0.7–4.0)
LYMPHS PCT: 23.6 % (ref 12.0–46.0)
MCHC: 33.6 g/dL (ref 30.0–36.0)
MCV: 95.7 fl (ref 78.0–100.0)
MONOS PCT: 6.6 % (ref 3.0–12.0)
Monocytes Absolute: 1 10*3/uL (ref 0.1–1.0)
NEUTROS ABS: 10.7 10*3/uL — AB (ref 1.4–7.7)
Neutrophils Relative %: 68.9 % (ref 43.0–77.0)
Platelets: 380 10*3/uL (ref 150.0–400.0)
RBC: 4.85 Mil/uL (ref 3.87–5.11)
RDW: 14 % (ref 11.5–15.5)
WBC: 15.5 10*3/uL — AB (ref 4.0–10.5)

## 2014-04-20 LAB — T4, FREE: Free T4: 1.07 ng/dL (ref 0.60–1.60)

## 2014-04-20 LAB — TSH: TSH: 0.37 u[IU]/mL (ref 0.35–4.50)

## 2014-04-20 MED ORDER — MEDROXYPROGESTERONE ACETATE 150 MG/ML IM SUSP
150.0000 mg | Freq: Once | INTRAMUSCULAR | Status: AC
  Administered 2014-04-20: 150 mg via INTRAMUSCULAR

## 2014-04-20 NOTE — Addendum Note (Signed)
Addended by: Desmond Dike on: 04/20/2014 11:01 AM   Modules accepted: Orders

## 2014-04-20 NOTE — Assessment & Plan Note (Signed)
Improving but remained elevated. Will recheck CBC today.

## 2014-04-20 NOTE — Progress Notes (Signed)
Pre visit review using our clinic review tool, if applicable. No additional management support is needed unless otherwise documented below in the visit note. 

## 2014-04-20 NOTE — Assessment & Plan Note (Signed)
Repeat thyroid panel today.

## 2014-04-20 NOTE — Patient Instructions (Signed)
Great to see you. Please schedule your physical/pap smear.

## 2014-04-20 NOTE — Progress Notes (Signed)
Subjective:   Patient ID: Makayla Green, female    DOB: [birth year], 43 y.o.   MRN: [id number]  Makayla Green is a pleasant 43 y.o. year old female whom I have not seen in over a year, presents to clinic today with "not feeling right" and Follow-up  on 04/20/2014  HPI:  Here for follow up.  Past month, she has been waking up in the middle of the night drenched in sweat several nights per week.  Has been receiving IM depo provera q 3 months for contraception (she is a smoker).  Not having periods since restarting this.  Last dose received here on 02/02/14.  Has a cough in the morning but this is unchanged.  No hemoptysis. CXR was neg.  EXAM:  CHEST 2 VIEW  COMPARISON: None.  FINDINGS:  The heart size and mediastinal contours are within normal limits.  Both lungs are clear. The visualized skeletal structures are  unremarkable.  IMPRESSION:  Normal chest.  Electronically Signed  By: Geanie Cooley M.D.  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  Appetite good. Wt Readings from Last 3 Encounters:  04/20/14 143 lb 8 oz (65.091 kg)  03/20/14 140 lb 8 oz (63.73 kg)  02/24/13 141 lb (63.957 kg)   No fevers.  CBC was elevated but trending down. Lab Results  Component Value Date   WBC 11.7* 04/07/2014   HGB 14.3 04/07/2014   HCT 42.0 04/07/2014   MCV 93.4 04/07/2014   PLT 329.0 04/07/2014   Lab Results  Component Value Date   TSH 0.34* 03/20/2014     Current Outpatient Prescriptions on File Prior to Visit  Medication Sig Dispense Refill  . fexofenadine-pseudoephedrine (ALLEGRA-D 24) 180-240 MG per 24 hr tablet Take 1 tablet by mouth daily.       No current facility-administered medications on file prior to visit.    Allergies  Allergen Reactions  . Penicillins Shortness Of Breath and Swelling    Past Medical  History  Diagnosis Date  . Other general counseling and advice for contraceptive management   . Routine general medical examination at a health care facility   . Screening for ischemic heart disease   . Tobacco use disorder   . Abnormal pap     LEEP 2005    No past surgical history on file.  No family history on file.  History   Social History  . Marital Status: Single    Spouse Name: N/A    Number of Children: 3  . Years of Education: N/A   Occupational History  . Not on file.   Social History Main Topics  . Smoking status: Current Every Day Smoker  . Smokeless tobacco: Not on file  . Alcohol Use: No  . Drug Use: No  . Sexual Activity: Not on file   Other Topics Concern  . Not on file   Social History Narrative  . No narrative on file   The PMH, PSH, Social History, Family History, Medications, and allergies have been reviewed in [facility], and have been updated if relevant.   Review of Systems + tingling in hands No nausea or vomiting No day time hot flashes No headaches No extremity weakness No increased thirst or urination No CP or SOB No blood in stool No bone pain  Objective:    BP 110/68  Pulse 99  Temp(Src) 98.2 F (36.8 C) (Oral)  Wt 143 lb 8 oz (65.091 kg)  SpO2 97%   Physical Exam  Gen:  Alert, NAD HEENT:  Mild thyromegaly Resp:CTA bilaterally  CVS: RRR Abd: soft, NT Psych:  Good eye contact, not anxious or depressed appearing Lymph:  No cervical lymphadenopathy      Assessment & Plan:   Leukocytosis - Plan: CBC with Differential  Night sweats  Encounter for other contraceptive management No Follow-up on file.

## 2014-04-20 NOTE — Assessment & Plan Note (Signed)
Persistent. Overdue for pap smear- she wants to schedule this for another day. Also discussed ? Early menopause.  Would need to stop depo injections to evaluate.  She does not want to do this yet. Call or return to clinic prn if these symptoms worsen or fail to improve as anticipated. The patient indicates understanding of these issues and agrees with the plan.

## 2014-04-21 ENCOUNTER — Other Ambulatory Visit: Payer: Self-pay | Admitting: Family Medicine

## 2014-04-21 DIAGNOSIS — D72829 Elevated white blood cell count, unspecified: Secondary | ICD-10-CM

## 2014-04-27 ENCOUNTER — Ambulatory Visit: Payer: 59 | Admitting: Family Medicine

## 2014-05-11 ENCOUNTER — Other Ambulatory Visit (HOSPITAL_COMMUNITY)
Admission: RE | Admit: 2014-05-11 | Discharge: 2014-05-11 | Disposition: A | Discharge status: Home / Self Care | Payer: 59 | Source: Ambulatory Visit | Attending: Family Medicine | Admitting: Family Medicine

## 2014-05-11 ENCOUNTER — Encounter: Payer: Self-pay | Admitting: Family Medicine

## 2014-05-11 ENCOUNTER — Ambulatory Visit (INDEPENDENT_AMBULATORY_CARE_PROVIDER_SITE_OTHER): Payer: 59 | Admitting: Family Medicine

## 2014-05-11 VITALS — BP 102/66 | HR 91 | Temp 98.2°F | Wt 145.5 lb

## 2014-05-11 DIAGNOSIS — F329 Major depressive disorder, single episode, unspecified: Secondary | ICD-10-CM

## 2014-05-11 DIAGNOSIS — Z01419 Encounter for gynecological examination (general) (routine) without abnormal findings: Secondary | ICD-10-CM | POA: Diagnosis present

## 2014-05-11 DIAGNOSIS — Z1231 Encounter for screening mammogram for malignant neoplasm of breast: Secondary | ICD-10-CM

## 2014-05-11 DIAGNOSIS — Z23 Encounter for immunization: Secondary | ICD-10-CM

## 2014-05-11 DIAGNOSIS — F418 Other specified anxiety disorders: Secondary | ICD-10-CM

## 2014-05-11 DIAGNOSIS — Z1151 Encounter for screening for human papillomavirus (HPV): Secondary | ICD-10-CM | POA: Insufficient documentation

## 2014-05-11 DIAGNOSIS — F32A Depression, unspecified: Secondary | ICD-10-CM

## 2014-05-11 DIAGNOSIS — F419 Anxiety disorder, unspecified: Secondary | ICD-10-CM

## 2014-05-11 MED ORDER — SERTRALINE HCL 25 MG PO TABS: 25.0000 mg | ORAL_TABLET | Freq: Every day | ORAL | Status: DC

## 2014-05-11 NOTE — Assessment & Plan Note (Signed)
Pap smear done today. Mammogram ordered- given breast center contact information- she will call to set this up.

## 2014-05-11 NOTE — Assessment & Plan Note (Signed)
New- progressive. Discussed tx options. She is refusing psychotherapy. Will start low dose SSRI- eRx sent for zoloft 25 mg daily. Follow up in 3 weeks.

## 2014-05-11 NOTE — Progress Notes (Signed)
Pre visit review using our clinic review tool, if applicable. No additional management support is needed unless otherwise documented below in the visit note. 

## 2014-05-11 NOTE — Patient Instructions (Signed)
Great to see you. Please set up your mammogram. We are starting zoloft 25 mg daily. Call me or come see me in 3 weeks to update me.

## 2014-05-11 NOTE — Progress Notes (Signed)
  Subjective:     Flo L Marland McalpineZagurski is a 43 y.o. woman who comes in today for a  pap smear only. Her most recent annual exam and pap smear was on 10/11/10 (done by me which was normal). Previous abnormal Pap smears: yes - had a LEEP in 2005.Marland Kitchen. Contraception: Depo-Provera injections. Not having regular periods.  Has never had a mammogram.  Would also like to discuss worsening anxiety, depression and irritability. Feels she has less patience for everyone lately.  She is tired, not sleeping well. No panic attacks.  No SI or HI.  Has never taken an SSRI.  Did have an adverse rxn to amitriptyline years ago for migraine prophylaxis and did have hallucinations- " I thought my walls were talking to me."  Current Outpatient Prescriptions on File Prior to Visit  Medication Sig Dispense Refill  . fexofenadine-pseudoephedrine (ALLEGRA-D 24) 180-240 MG per 24 hr tablet Take 1 tablet by mouth daily.       No current facility-administered medications on file prior to visit.    Allergies  Allergen Reactions  . Penicillins Shortness Of Breath and Swelling  . Amitriptyline     Hallucinations    Past Medical History  Diagnosis Date  . Other general counseling and advice for contraceptive management   . Routine general medical examination at a health care facility   . Screening for ischemic heart disease   . Tobacco use disorder   . Abnormal pap     LEEP 2005    No past surgical history on file.  No family history on file.  History   Social History  . Marital Status: Single    Spouse Name: N/A    Number of Children: 3  . Years of Education: N/A   Occupational History  . Not on file.   Social History Main Topics  . Smoking status: Current Every Day Smoker  . Smokeless tobacco: Not on file  . Alcohol Use: No  . Drug Use: No  . Sexual Activity: Not on file   Other Topics Concern  . Not on file   Social History Narrative  . No narrative on file   The PMH, PSH, Social History,  Family History, Medications, and allergies [facility], and have been updated if relevant.    Review of Systems See HPI Genitourinary:negative for genital lesions, sexual problems and vaginal discharge   Objective:    BP 102/66  Pulse 91  Temp(Src) 98.2 F (36.8 C) (Oral)  Wt 145 lb 8 oz (65.998 kg)  SpO2 98% Pelvic Exam: cervix normal in appearance, external genitalia normal and vagina normal without discharge. Pap smear obtained.  Psych:  Appears anxious today, not depressed appearing Good eye contact.

## 2014-05-12 ENCOUNTER — Ambulatory Visit: Payer: Self-pay | Admitting: Oncology

## 2014-05-12 ENCOUNTER — Telehealth: Payer: Self-pay | Admitting: Family Medicine

## 2014-05-12 LAB — CBC CANCER CENTER
BASOS ABS: 0.1 x10 3/mm (ref 0.0–0.1)
BASOS PCT: 0.9 %
EOS PCT: 0.9 %
Eosinophil #: 0.1 x10 3/mm (ref 0.0–0.7)
HCT: 45.8 % (ref 35.0–47.0)
HGB: 14.8 g/dL (ref 12.0–16.0)
Lymphocyte #: 2.6 x10 3/mm (ref 1.0–3.6)
Lymphocyte %: 22.2 %
MCH: 31.6 pg (ref 26.0–34.0)
MCHC: 32.4 g/dL (ref 32.0–36.0)
MCV: 97 fL (ref 80–100)
MONO ABS: 1.1 x10 3/mm — AB (ref 0.2–0.9)
Monocyte %: 9.7 %
NEUTROS ABS: 7.8 x10 3/mm — AB (ref 1.4–6.5)
NEUTROS PCT: 66.3 %
PLATELETS: 326 x10 3/mm (ref 150–440)
RBC: 4.7 10*6/uL (ref 3.80–5.20)
RDW: 13.9 % (ref 11.5–14.5)
WBC: 11.7 x10 3/mm — AB (ref 3.6–11.0)

## 2014-05-12 LAB — LACTATE DEHYDROGENASE: LDH: 146 U/L (ref 81–246)

## 2014-05-12 LAB — CYTOLOGY - PAP

## 2014-05-12 NOTE — Telephone Encounter (Signed)
emmi emailed °

## 2014-05-15 ENCOUNTER — Encounter: Payer: Self-pay | Admitting: *Deleted

## 2014-05-18 ENCOUNTER — Telehealth: Payer: Self-pay | Admitting: Family Medicine

## 2014-05-18 NOTE — Telephone Encounter (Signed)
I'm not quite sure what is causing it.  I just received her note from hematology today which was reassuring.  Perhaps we should look into something rheumatological?  If she is willing, we could do more labs like sed rate, crp, etc

## 2014-05-18 NOTE — Telephone Encounter (Signed)
Spoke with patient and she said the fever isn't new and that she has talked to Dr. Dayton MartesAron about it. She said she feels comfortable waiting to hear what she has to say.

## 2014-05-18 NOTE — Telephone Encounter (Signed)
Is fever new? I don't see that she's had fever in past. If new, would recommend evaluation. Otherwise may await PCP input. Will also route to PCP.

## 2014-05-18 NOTE — Telephone Encounter (Signed)
Patient Information:  Caller Name: Jaquelin  Phone: [phone]  Patient: Makayla Green, Makayla Green  Gender: Female  DOB: 07/25/1971  Age: [age] Years  PCP: Ruthe MannanAron, [facility](Family Practice)  Pregnant: No  Office Follow Up:  Does the office need to follow up with this patient?: Yes  Instructions For The Office: Please see note with pt's question and follow up wtih pt.   Symptoms  Reason For Call & Symptoms: 05/18/14 feels like legs and arms weigh about 500 pounds, achy like when getting the flu although has no cold sxs.  C/o headache, rates 2/10.    1430 Temp 101 Axillary.   Taking Tylenol/Motrin which doesn't really help with 'feeling like crap'.  These same sxs have been ongoing x 3 months where pt will have intermittent fever and aches.  Dr Dayton MartesAron has ran tests and Pt had testing done 05/12/14 at [facility].   Pt was calling today due to achiness is worse than normal, advised pt to see MD, but she is hesitant to see another MD due to 'everything going on' and then gettng another doctor in the middle of it.   Pt requests if a note can be sent to MD covering for Dr Dayton MartesAron to see if any further recommendations or if she should just continue to monitor sxs.  Reviewed Health History In EMR: Yes  Reviewed Medications In EMR: Yes  Reviewed Allergies In EMR: Yes  Reviewed Surgeries / Procedures: Yes  Date of Onset of Symptoms: 04/18/2014  Treatments Tried: Tylenol Motrin  Treatments Tried Worked: No  Any Fever: Yes  Fever Taken: Axillary  Fever Time Of Reading: 14:30:00  Fever Last Reading: 101.0 OB / GYN:  LMP: 04/18/2014  Guideline(s) Used:  Fever  Disposition Per Guideline:   See Today or Tomorrow in Office  Reason For Disposition Reached:   Intermittent fever > 100.5 F persists > 3 weeks  Advice Given:  N/A  Patient Will Follow Care Advice:  YES

## 2014-05-19 NOTE — Telephone Encounter (Signed)
That's great news.  Thanks for the updated.

## 2014-05-19 NOTE — Telephone Encounter (Signed)
Spoke to pt and advised per Dr Dayton MartesAron. Pt states that she is "back to normal this morning. The fever is completely gone." Pt states that if the s/s return she will make a f/u appt

## 2014-05-24 ENCOUNTER — Ambulatory Visit (INDEPENDENT_AMBULATORY_CARE_PROVIDER_SITE_OTHER): Payer: 59 | Admitting: Family Medicine

## 2014-05-24 ENCOUNTER — Encounter: Payer: Self-pay | Admitting: Family Medicine

## 2014-05-24 VITALS — BP 118/68 | HR 91 | Temp 98.1°F | Wt 142.0 lb

## 2014-05-24 DIAGNOSIS — R509 Fever, unspecified: Secondary | ICD-10-CM

## 2014-05-24 DIAGNOSIS — M791 Myalgia, unspecified site: Secondary | ICD-10-CM | POA: Insufficient documentation

## 2014-05-24 LAB — MONONUCLEOSIS SCREEN: Mono Screen: NEGATIVE

## 2014-05-24 MED ORDER — DOXYCYCLINE HYCLATE 100 MG PO TABS: 100.0000 mg | ORAL_TABLET | Freq: Two times a day (BID) | ORAL | Status: DC

## 2014-05-24 NOTE — Progress Notes (Signed)
Subjective:   Patient ID: Makayla Green, female    DOB: 1970-12-01, 43 y.o.   MRN: [id number]  Makayla Green is a pleasant 43 y.o. year old female whom I have not seen in over a year, presents to clinic today with "not feeling right" and Follow-up  on 05/24/2014  HPI:  Here for follow up. See previous OV for summary of this issue.  Night sweats have resolved.  Thought her symptoms had resolved but then spiked a temp this past week of 101.3.  Also still very fatigued.    Long term smoker.  CXR neg.     EXAM:  CHEST 2 VIEW  COMPARISON: None.  FINDINGS:  The heart size and mediastinal contours are within normal limits.  Both lungs are clear. The visualized skeletal structures are  unremarkable.  IMPRESSION:  Normal chest.  Electronically Signed  By: Geanie CooleyJim Maxwell M.D.  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  Appetite good. Wt Readings from Last 3 Encounters:  05/24/14 142 lb (64.411 kg)  05/11/14 145 lb 8 oz (65.998 kg)  04/20/14 143 lb 8 oz (65.091 kg)   No fevers.  CBC was elevated but trending down.  Referred to hematology- note reviewed- advised observation. Lab Results  Component Value Date   WBC 15.5* 04/20/2014   HGB 15.6* 04/20/2014   HCT 46.4* 04/20/2014   MCV 95.7 04/20/2014   PLT 380.0 04/20/2014   Lab Results  Component Value Date   TSH 0.37 04/20/2014     Current Outpatient Prescriptions on File Prior to Visit  Medication Sig Dispense Refill  . fexofenadine-pseudoephedrine (ALLEGRA-D 24) 180-240 MG per 24 hr tablet Take 1 tablet by mouth daily.      . sertraline (ZOLOFT) 25 MG tablet Take 1 tablet (25 mg total) by mouth daily.  30 tablet  1   No current facility-administered medications on file prior to visit.    Allergies  Allergen Reactions  . Penicillins Shortness Of Breath and  Swelling  . Amitriptyline     Hallucinations    Past Medical History  Diagnosis Date  . Other general counseling and advice for contraceptive management   . Routine general medical examination at a health care facility   . Screening for ischemic heart disease   . Tobacco use disorder   . Abnormal pap     LEEP 2005    No past surgical history on file.  No family history on file.  History   Social History  . Marital Status: Married    Spouse Name: N/A    Number of Children: 3  . Years of Education: N/A   Occupational History  . Not on file.   Social History Main Topics  . Smoking status: Current Every Day Smoker -- 1.00 packs/day    Types: Cigarettes  . Smokeless tobacco: Not on file  . Alcohol Use: No  . Drug Use: No  . Sexual Activity: Not on file   Other Topics Concern  . Not on file   Social History Narrative  . No narrative on file   The PMH, PSH, Social History, Family History, Medications, and allergies have been [facility], and have been updated if relevant.   Review of Systems + fatigue No nausea or vomiting No headaches No extremity weakness No increased thirst  or urination No CP or SOB No blood in stool No bone pain     Objective:    BP 118/68  Pulse 91  Temp(Src) 98.1 F (36.7 C) (Oral)  Wt 142 lb (64.411 kg)  SpO2 99%   Physical Exam  Nursing note and vitals reviewed. Constitutional: He appears well-developed and well-nourished. No distress.  HENT:  Head: Normocephalic.  Neck: Normal range of motion. Neck supple.  Cardiovascular: Normal rate and regular rhythm.   Pulmonary/Chest: Effort normal and breath sounds normal. No respiratory distress.  Lymphadenopathy:    He has no cervical adenopathy.  Psychiatric: He has a normal mood and affect. His behavior is normal. Judgment and thought content normal.          Assessment & Plan:   Other specified fever - Plan: Rocky mtn spotted fvr abs pnl(IgG+IgM), Lyme Disease Ab,  Quant, IgM, Mononucleosis screen, Epstein-Barr virus VCA, IgG  Myalgia No Follow-up on file.

## 2014-05-24 NOTE — Progress Notes (Signed)
Pre visit review using our clinic review tool, if applicable. No additional management support is needed unless otherwise documented below in the visit note. 

## 2014-05-24 NOTE — Patient Instructions (Signed)
Take doxycyline 100 mg twice daily x 10 days. I will call you with your lab results.

## 2014-05-24 NOTE — Assessment & Plan Note (Signed)
Persistent and intermittent. Etiology still remains unclear. She declined tick bites previously but now states perhaps she did have some.  No rashes. Will check RMSF and Lyme titers along with EBV and mono spot. Start on Doxycyline 100 mg twice daily.

## 2014-05-25 ENCOUNTER — Telehealth: Payer: Self-pay | Admitting: Family Medicine

## 2014-05-25 LAB — EPSTEIN-BARR VIRUS VCA, IGG: EBV VCA IgG: >750 U/mL — ABNORMAL HIGH (ref <18.0)

## 2014-05-25 LAB — B. BURGDORFI ANTIBODIES: B burgdorferi Ab IgG+IgM: 0.34 {ISR}

## 2014-05-25 NOTE — Telephone Encounter (Signed)
emmi emailed °

## 2014-05-25 NOTE — Telephone Encounter (Signed)
Pt returned your call about lab results 

## 2014-05-26 ENCOUNTER — Other Ambulatory Visit: Payer: Self-pay | Admitting: Family Medicine

## 2014-05-26 DIAGNOSIS — B2700 Gammaherpesviral mononucleosis without complication: Secondary | ICD-10-CM

## 2014-05-26 LAB — ROCKY MTN SPOTTED FVR ABS PNL(IGG+IGM)
RMSF IgG: 0.19 IV
RMSF IgM: 0.21 IV

## 2014-05-26 NOTE — Telephone Encounter (Signed)
Returned her call again- no voicemail picked up this time so I sent her a [facility] message.

## 2014-06-04 ENCOUNTER — Ambulatory Visit: Payer: Self-pay | Admitting: Oncology

## 2014-06-07 ENCOUNTER — Ambulatory Visit: Payer: Self-pay

## 2014-06-14 ENCOUNTER — Other Ambulatory Visit: Payer: Self-pay | Admitting: Family Medicine

## 2014-06-14 MED ORDER — DOXYCYCLINE HYCLATE 100 MG PO TABS: 100.0000 mg | ORAL_TABLET | Freq: Two times a day (BID) | ORAL | Status: DC

## 2014-06-14 MED ORDER — VARENICLINE TARTRATE 0.5 MG X 11 & 1 MG X 42 PO MISC: ORAL | Status: DC

## 2014-06-22 ENCOUNTER — Encounter: Payer: Self-pay | Admitting: Family Medicine

## 2014-07-06 ENCOUNTER — Encounter: Payer: Self-pay | Admitting: Pulmonary Disease

## 2014-07-06 ENCOUNTER — Ambulatory Visit (INDEPENDENT_AMBULATORY_CARE_PROVIDER_SITE_OTHER): Payer: 59 | Admitting: Pulmonary Disease

## 2014-07-06 VITALS — BP 108/68 | HR 114 | Ht 67.0 in | Wt 151.0 lb

## 2014-07-06 DIAGNOSIS — J432 Centrilobular emphysema: Secondary | ICD-10-CM | POA: Insufficient documentation

## 2014-07-06 DIAGNOSIS — F17200 Nicotine dependence, unspecified, uncomplicated: Secondary | ICD-10-CM

## 2014-07-06 DIAGNOSIS — Z720 Tobacco use: Secondary | ICD-10-CM

## 2014-07-06 DIAGNOSIS — R911 Solitary pulmonary nodule: Secondary | ICD-10-CM

## 2014-07-06 HISTORY — DX: Solitary pulmonary nodule: R91.1

## 2014-07-06 MED ORDER — FLUTTER DEVI: 1.0000 | Freq: Every day | Status: DC

## 2014-07-06 NOTE — Patient Instructions (Signed)
Start using Breo daily  Quit smoking Use a flutter valve 5-10 breaths 3 times per day Take mucinex twice per day Give the three mucus samples to Dr. Sampson GoonFitzgerald We will collect blood work on you in one week in the [place] office We will see you back in 2-3 weeks

## 2014-07-06 NOTE — Assessment & Plan Note (Signed)
The this nodule is benign in appearance because it is so small, it needs attention and she should have a repeat CT chest in 12 months. She is a high-risk patient considering her smoking history.

## 2014-07-06 NOTE — Assessment & Plan Note (Signed)
Advised at length to quit. Discussed various options including Chantix and Wellbutrin. She is not ready to use medical therapy at this time.

## 2014-07-06 NOTE — Assessment & Plan Note (Signed)
I agree with the workup conducted by infectious disease up until this point. I have recommended that she stay off of antibiotics for several weeks and that we can test her for atypical mycobacterial respiratory infection.

## 2014-07-06 NOTE — Progress Notes (Signed)
Subjective:    Patient ID: Makayla Green, female    DOB: 05/10/1971, 43 y.o.   MRN: [id number]  HPI  Makayla Green is here to see me today for some cough and and abnormal CT chest.  She was referred by her PCP Dr. Dayton MartesAron for the same.  She sees Dr. Sampson GoonFitzgerald with infectious diseases.  She says that as a child she had a lot of asthma and was hospitalized frequently and received a lot of antibiotics as a child.  She stays that for her entire life she has been on antihistamines for sinus congestion and sometimes has asthma symptoms (as an adult).  Unfortunately she started smoking as an adult.  She says that in June she started having a lot of sinus symptoms and then bronchitis after the pollen came out.  She says that she also tried switching to the electronic cigarette at this time.  Sh ehad a lot of mucus in her chest and had to get treated with antibiotics.  She really didn't get much better over several weeks and required antibiotics again in August for the same symptoms.  She said that she had a lot of night sweats.  She continues to have night sweats now and continues to have a lot of body aches.  Her weight has remained stable this year.  She has been treated with biaxin several times and a zpack on two separate occassions.  She most recently was treated with biaxin as of a week ago.  She just finished prdnisone this year.    Before 2015 her asthma was fairly well controlled since age 89 (her last hospitalization).  She would have to go to the doctor maybe 1-2 time per year for allergies and possibly bronchitis which was always easy to treat.  She started smoking age 43 and continues to smoke.  She is down to 1/2 pack per day but she was smoking as much as 2 ppd recently.  She has chantix but hasn't started.   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  She works in a garden regularly.  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    She continues to struggle with  chest congestion this week.  Most recently she had completed antibiotics as recent as 5 days ago.  However about 3 days prior to this visit she started to have more chest and sinus congestion.  She feels like there is a large ball of mucus in her chest.  She is not short of breath and she is not wheezing now, but this was more prominent in the past this year. She was prescribed antibiotics and has had respiratory cultures.    She does not take symbicort even though she has it.    Past Medical History  Diagnosis Date  . Other general counseling and advice for contraceptive management   . Routine general medical examination at a health care facility   . Screening for ischemic heart disease   . Tobacco use disorder   . Abnormal pap     LEEP 2005     Family History  Problem Relation Age of Onset  . COPD Mother      History   Social History  . Marital Status: Married    Spouse Name: N/A    Number of Children: 3  . Years of Education: N/A   Occupational History  . Not on file.   Social History Main Topics  .  Smoking status: Current Every Day Smoker -- 1.00 packs/day for 25 years    Types: Cigarettes  . Smokeless tobacco: Never Used  . Alcohol Use: No  . Drug Use: No  . Sexual Activity: Not on file   Other Topics Concern  . Not on file   Social History Narrative     Allergies  Allergen Reactions  . Penicillins Shortness Of Breath and Swelling  . Amitriptyline     Hallucinations     Outpatient Prescriptions Prior to Visit  Medication Sig Dispense Refill  . fexofenadine-pseudoephedrine (ALLEGRA-D 24) 180-240 MG per 24 hr tablet Take 1 tablet by mouth daily.    Marland Kitchen. ibuprofen (ADVIL,MOTRIN) 200 MG tablet Take 800 mg by mouth every 4 (four) hours as needed.    . varenicline (CHANTIX STARTING MONTH PAK) 0.5 MG X 11 & 1 MG X 42 tablet Take one 0.5 mg tablet by mouth once daily for 3 days, then increase to one 0.5 mg tablet twice daily for 4 days, then increase to one 1 mg  tablet twice daily. (Patient not taking: Reported on 07/06/2014) 53 tablet 0  . doxycycline (VIBRA-TABS) 100 MG tablet Take 1 tablet (100 mg total) by mouth 2 (two) times daily. (Patient not taking: Reported on 07/06/2014) 20 tablet 0  . sertraline (ZOLOFT) 25 MG tablet Take 1 tablet (25 mg total) by mouth daily. (Patient not taking: Reported on 07/06/2014) 30 tablet 1   No facility-administered medications prior to visit.      Review of Systems  Constitutional: Negative for fever and unexpected weight change.  HENT: Negative for congestion, dental problem, ear pain, nosebleeds, postnasal drip, rhinorrhea, sinus pressure, sneezing, sore throat and trouble swallowing.   Eyes: Negative for redness and itching.  Respiratory: Positive for cough and shortness of breath. Negative for chest tightness.   Cardiovascular: Negative for palpitations and leg swelling.  Gastrointestinal: Negative for nausea and vomiting.  Genitourinary: Negative for dysuria.  Musculoskeletal: Negative for joint swelling.  Skin: Negative for rash.  Neurological: Negative for headaches.  Hematological: Does not bruise/bleed easily.  Psychiatric/Behavioral: Negative for dysphoric mood. The patient is not nervous/anxious.        Objective:   Physical Exam Filed Vitals:   07/06/14 1522  BP: 108/68  Pulse: 114  Height: 5\' 7"  (1.702 m)  Weight: 151 lb (68.493 kg)  SpO2: 100%   RA  Gen: well appearing, no acute distress HEENT: NCAT, PERRL, EOMi, OP clear, neck supple without masses PULM: CTA B CV: RRR, no mgr, no JVD AB: BS+, soft, nontender, no hsm Ext: warm, no edema, no clubbing, no cyanosis Derm: no rash or skin breakdown Neuro: A&Ox4, CN II-XII intact, strength 5/5 in all 4 extremities        Assessment & Plan:   Centrilobular emphysema She has mild airflow obstruction but  Significant  Emphysema on the November 2015 CT chest.  This is clearly due to her ongoing tobacco use.   I explained to her  today that her cough, and mucus per clearly related to her COPD. I agree with the infectious disease physician that she should be worked up for atypical mycobacterial disease. Considering the decline in her symptoms in the last year it is also reasonable to test her for allergic bronchopulmonary aspergillosis.  Plan:  -the hallmark of therapy here should be that she quit smoking cigarettes  -I educated her at length that she needs to use Breo daily no matter how she feels -Check Ige, Aspergillus antigens (IgE  specific)  Fever  I agree with the workup conducted by infectious disease up until this point. I have recommended that she stay off of antibiotics for several weeks and that we can test her for atypical mycobacterial respiratory infection.  Solitary pulmonary nodule  The this nodule is benign in appearance because it is so small, it needs attention and she should have a repeat CT chest in 12 months. She is a high-risk patient considering her smoking history.  TOBACCO ABUSE  Advised at length to quit. Discussed various options including Chantix and Wellbutrin. She is not ready to use medical therapy at this time.    Updated Medication List Outpatient Encounter Prescriptions as of 07/06/2014  Medication Sig  . fexofenadine-pseudoephedrine (ALLEGRA-D 24) 180-240 MG per 24 hr tablet Take 1 tablet by mouth daily.  Marland Kitchen ibuprofen (ADVIL,MOTRIN) 200 MG tablet Take 800 mg by mouth every 4 (four) hours as needed.  Marland Kitchen Respiratory Therapy Supplies (FLUTTER) DEVI 1 Device by Does not apply route daily.  . varenicline (CHANTIX STARTING MONTH PAK) 0.5 MG X 11 & 1 MG X 42 tablet Take one 0.5 mg tablet by mouth once daily for 3 days, then increase to one 0.5 mg tablet twice daily for 4 days, then increase to one 1 mg tablet twice daily. (Patient not taking: Reported on 07/06/2014)  . [DISCONTINUED] doxycycline (VIBRA-TABS) 100 MG tablet Take 1 tablet (100 mg total) by mouth 2 (two) times daily. (Patient not  taking: Reported on 07/06/2014)  . [DISCONTINUED] sertraline (ZOLOFT) 25 MG tablet Take 1 tablet (25 mg total) by mouth daily. (Patient not taking: Reported on 07/06/2014)

## 2014-07-06 NOTE — Assessment & Plan Note (Signed)
She has mild airflow obstruction but  Significant  Emphysema on the November 2015 CT chest.  This is clearly due to her ongoing tobacco use.   I explained to her today that her cough, and mucus per clearly related to her COPD. I agree with the infectious disease physician that she should be worked up for atypical mycobacterial disease. Considering the decline in her symptoms in the last year it is also reasonable to test her for allergic bronchopulmonary aspergillosis.  Plan:  -the hallmark of therapy here should be that she quit smoking cigarettes  -I educated her at length that she needs to use Breo daily no matter how she feels -Check Ige, Aspergillus antigens (IgE specific)

## 2014-07-07 ENCOUNTER — Ambulatory Visit (INDEPENDENT_AMBULATORY_CARE_PROVIDER_SITE_OTHER): Payer: 59

## 2014-07-07 DIAGNOSIS — Z3042 Encounter for surveillance of injectable contraceptive: Secondary | ICD-10-CM

## 2014-07-07 MED ORDER — MEDROXYPROGESTERONE ACETATE 150 MG/ML IM SUSP
150.0000 mg | Freq: Once | INTRAMUSCULAR | Status: AC
  Administered 2014-07-07: 150 mg via INTRAMUSCULAR

## 2014-07-11 ENCOUNTER — Telehealth: Payer: Self-pay | Admitting: Pulmonary Disease

## 2014-07-11 NOTE — Telephone Encounter (Signed)
Called and spoke to pt. Pt stated she is unable to bring up any mucus from cough to give samples to Dr. Sampson GoonFitzgerald. Pt is taking mucinex twice a day and there is no improvement in her chest congestion.  Will send to BQ as FYI.

## 2014-07-18 ENCOUNTER — Other Ambulatory Visit: Payer: 59

## 2014-07-19 ENCOUNTER — Other Ambulatory Visit (INDEPENDENT_AMBULATORY_CARE_PROVIDER_SITE_OTHER): Payer: 59

## 2014-07-19 ENCOUNTER — Telehealth: Payer: Self-pay | Admitting: Pulmonary Disease

## 2014-07-19 DIAGNOSIS — Z720 Tobacco use: Secondary | ICD-10-CM

## 2014-07-19 DIAGNOSIS — F17200 Nicotine dependence, unspecified, uncomplicated: Secondary | ICD-10-CM

## 2014-07-19 NOTE — Telephone Encounter (Signed)
The next step would be to perform a bronchoscopy Please find out when she is to see me next and see if we can work her into clinic next week

## 2014-07-19 NOTE — Telephone Encounter (Signed)
Please advise Dr. McQuaid thanks 

## 2014-07-19 NOTE — Telephone Encounter (Signed)
Her next appt is 12/22 at 4:30 with BQ.   lmomtcb x 1 for the pt to make sure she keeps this appt.

## 2014-07-20 LAB — IGE: IGE (IMMUNOGLOBULIN E), SERUM: 32 kU/L (ref <115)

## 2014-07-20 NOTE — Telephone Encounter (Signed)
Called and spoke to pt. Informed pt of the recs per BQ. Pt is aware to keep appt on 07/25/14 with BQ. Nothing further needed.

## 2014-07-20 NOTE — Telephone Encounter (Signed)
[phone] returning call

## 2014-07-21 NOTE — Telephone Encounter (Signed)
I called him yesterday.

## 2014-07-25 ENCOUNTER — Encounter: Payer: Self-pay | Admitting: Pulmonary Disease

## 2014-07-25 ENCOUNTER — Ambulatory Visit (INDEPENDENT_AMBULATORY_CARE_PROVIDER_SITE_OTHER): Payer: 59 | Admitting: Pulmonary Disease

## 2014-07-25 VITALS — BP 122/66 | HR 84 | Ht 67.0 in | Wt 146.0 lb

## 2014-07-25 DIAGNOSIS — J432 Centrilobular emphysema: Secondary | ICD-10-CM

## 2014-07-25 DIAGNOSIS — R911 Solitary pulmonary nodule: Secondary | ICD-10-CM

## 2014-07-25 LAB — ASPERGILLUS IGE PANEL
Aspergillus IgE Panel: <0.1 kU/L (ref <0.35)
Aspergillus flavus IgE: <0.35 kU/L (ref <0.35)
Aspergillus nidulans IgE: <0.35 kU/L (ref <0.35)
CLASS: 0
CLASS: 0
CLASS: 0
Class: 0
Class: 0
Class: 0
Class: 0
Results Received-AspIgE: <0.1 kU/L (ref <0.35)

## 2014-07-25 NOTE — Assessment & Plan Note (Signed)
Plan repeat CT chest in November 2016

## 2014-07-25 NOTE — Assessment & Plan Note (Signed)
This has been a stable interval for Makayla Green. She has been doing well on the [place]. Her cough has improved significantly and she is no longer having the fevers. I have discussed [place] case [place] and he tells me that all culture data including AFB sputum specimens have been negative.  Plan: -At this point there is no indication for a bronchoscopy -Continue Breo -Educated at length today to quit smoking.

## 2014-07-25 NOTE — Patient Instructions (Signed)
Quit smoking Use the Chantix to help he quit smoking, beware that this can cause an abnormal mood to stop it immediately if you have strange thoughts or feel that you are in a bad mood Keep [facility] the [facility] daily Follow-up with me in 3 months or sooner if needed

## 2014-07-25 NOTE — Progress Notes (Signed)
   Subjective:    Patient ID: Makayla Green, female    DOB: 03/23/1971, 43 y.o.   MRN: [id number]  Synopsis: COPD, Gold grade B, currently smoking as of 2015 November 2015 CT chest with 4 mm nodule December 2015 simple spirometry ratio 51%, FEV1 2.93 L (92% predicted11/2015 CT chest with 4mm nodule and centrilobular emphyseam 07/2014 simple spirometry> Ratio 53% FEV1 2.93L (92% pred)  HPI Chief Complaint  Patient presents with  . Follow-up    Pt has no breathing complaints.  did not drop off sample of mucus d/t her not being able to cough anything up.  Tolerating breo very well.    Makayla Green says that she is feeling better.  The fevers have gone away, though she has some mild night sweats.  She says the breathing is better.  She still feels some mucus in her chest.  She says that she is not back to 100%, to 90%.  She is tolerating the Breo well, no mo major side effects.  She sometimes notes a scratchy throat and burning after using the Breo.  Still smoking 1/2 pack per day.    Past Medical History  Diagnosis Date  . Other general counseling and advice for contraceptive management   . Routine general medical examination at a health care facility   . Screening for ischemic heart disease   . Tobacco use disorder   . Abnormal pap     LEEP 2005     Review of Systems     Objective:   Physical Exam Filed Vitals:   07/25/14 1640  BP: 122/66  Pulse: 84  Height: 5\' 7"  (1.702 m)  Weight: 146 lb (66.225 kg)  SpO2: 97%   RA  Gen: well appearing, no acute distress HEENT: NCAT, EOMi, OP clear, PULM: CTA B CV: RRR, no mgr, no JVD AB: BS+, soft, nontender, Ext: warm, no edema, no clubbing, no cyanosis Derm: no rash or skin breakdown Neuro: A&Ox4, MAEW       Assessment & Plan:   Solitary pulmonary nodule Plan repeat CT chest in November 2016  Centrilobular emphysema This has been a stable interval for Makayla Green. She has been doing well on the [place]. Her cough has improved  significantly and she is no longer having the fevers. I have discussed her case [place] and he tells me that all culture data including AFB sputum specimens have been negative.  Plan: -At this point there is no indication for a bronchoscopy -Continue Breo -Educated at length today to quit smoking.    Updated Medication List Outpatient Encounter Prescriptions as of 07/25/2014  Medication Sig  . fexofenadine-pseudoephedrine (ALLEGRA-D 24) 180-240 MG per 24 hr tablet Take 1 tablet by mouth daily.  . Fluticasone Furoate-Vilanterol (BREO ELLIPTA) 200-25 MCG/INH AEPB Inhale 1 puff into the lungs daily.  Marland Kitchen. ibuprofen (ADVIL,MOTRIN) 200 MG tablet Take 800 mg by mouth every 4 (four) hours as needed.  Marland Kitchen. Respiratory Therapy Supplies (FLUTTER) DEVI 1 Device by Does not apply route daily.  . varenicline (CHANTIX STARTING MONTH PAK) 0.5 MG X 11 & 1 MG X 42 tablet Take one 0.5 mg tablet by mouth once daily for 3 days, then increase to one 0.5 mg tablet twice daily for 4 days, then increase to one 1 mg tablet twice daily. (Patient not taking: Reported on 07/06/2014)

## 2014-08-09 ENCOUNTER — Telehealth: Payer: Self-pay | Admitting: Pulmonary Disease

## 2014-08-09 MED ORDER — FLUTICASONE FUROATE-VILANTEROL 200-25 MCG/INH IN AEPB: 1.0000 | INHALATION_SPRAY | Freq: Every day | RESPIRATORY_TRACT | Status: DC

## 2014-08-09 NOTE — Telephone Encounter (Signed)
Spoke with pt. Rx has been sent in. Nothing further was needed. 

## 2014-08-15 ENCOUNTER — Encounter: Payer: Self-pay | Admitting: Pulmonary Disease

## 2014-08-15 NOTE — Telephone Encounter (Signed)
Spoke with patient-she states she has started having night sweats again like she did prior to seeing BQ. She is not wanting to come in to see another MD as she would like to speak with BQ directly about this and see what the plans are going from here. Pt is aware that BQ is not reachable at this time but will send message to him to advise as able. Pt is also aware if she starts feeling worse she is to go to the nearest UC or ED.   BQ please contact patient directly or advise. Thanks.

## 2014-08-24 NOTE — Telephone Encounter (Signed)
[phone] calling back

## 2014-08-24 NOTE — Progress Notes (Signed)
Quick Note:    Lmtcb  ______

## 2014-08-28 NOTE — Progress Notes (Signed)
Quick Note:  Pt aware of results. ______ 

## 2014-09-06 ENCOUNTER — Ambulatory Visit: Payer: Self-pay | Admitting: Pulmonary Disease

## 2014-09-12 ENCOUNTER — Telehealth: Payer: Self-pay | Admitting: Family Medicine

## 2014-09-12 NOTE — Telephone Encounter (Signed)
Pt called in wanting to schedule depo shot appt.  Pt does not remember the date that she is supposed to come in.  Please let pt know the date so she can schedule appt.  Her number is [phone] Thanks

## 2014-09-12 NOTE — Telephone Encounter (Signed)
She had her last injection on 12/4. The next one MUST to be scheduled between 2/19-3/5

## 2014-09-27 ENCOUNTER — Ambulatory Visit: Payer: Self-pay

## 2014-10-03 ENCOUNTER — Ambulatory Visit (INDEPENDENT_AMBULATORY_CARE_PROVIDER_SITE_OTHER): Payer: 59 | Admitting: *Deleted

## 2014-10-03 DIAGNOSIS — Z3042 Encounter for surveillance of injectable contraceptive: Secondary | ICD-10-CM

## 2014-10-03 DIAGNOSIS — Z3049 Encounter for surveillance of other contraceptives: Secondary | ICD-10-CM

## 2014-10-03 MED ORDER — MEDROXYPROGESTERONE ACETATE 150 MG/ML IM SUSP
150.0000 mg | Freq: Once | INTRAMUSCULAR | Status: AC
  Administered 2014-10-03: 150 mg via INTRAMUSCULAR

## 2014-10-10 ENCOUNTER — Ambulatory Visit (INDEPENDENT_AMBULATORY_CARE_PROVIDER_SITE_OTHER): Payer: 59 | Admitting: Pulmonary Disease

## 2014-10-10 ENCOUNTER — Encounter: Payer: Self-pay | Admitting: Pulmonary Disease

## 2014-10-10 VITALS — BP 126/64 | HR 95 | Ht 67.0 in | Wt 148.0 lb

## 2014-10-10 DIAGNOSIS — J432 Centrilobular emphysema: Secondary | ICD-10-CM

## 2014-10-10 DIAGNOSIS — R911 Solitary pulmonary nodule: Secondary | ICD-10-CM

## 2014-10-10 DIAGNOSIS — Z720 Tobacco use: Secondary | ICD-10-CM

## 2014-10-10 DIAGNOSIS — F17200 Nicotine dependence, unspecified, uncomplicated: Secondary | ICD-10-CM

## 2014-10-10 MED ORDER — FEXOFENADINE-PSEUDOEPHED ER 180-240 MG PO TB24: 1.0000 | ORAL_TABLET | Freq: Every day | ORAL | Status: DC

## 2014-10-10 NOTE — Assessment & Plan Note (Signed)
Needs CT chest in 06/2015, will order on f/u visit.

## 2014-10-10 NOTE — Assessment & Plan Note (Signed)
This has been a stable interval for Makayla Green.  She is doing well on the [facility] which is causing a little thrush.  Plan: -continue Breo -quit smoking -use baking-soda based toothepaste -f/u 6 months

## 2014-10-10 NOTE — Patient Instructions (Addendum)
Quit smoking Take Breo regularly Stay active Use arm & hammer based toothepaste We will see you back in 6 months or sooner if needed

## 2014-10-10 NOTE — Assessment & Plan Note (Signed)
She was counseled to quit smoking today.  She does not want to take medical therapy for this right now.

## 2014-10-10 NOTE — Progress Notes (Signed)
   Subjective:    Patient ID: Makayla Green, female    DOB: [birth year], 44 y.o.   MRN: [id number]  Synopsis: COPD, Gold grade B, currently smoking as of 2015 November 2015 CT chest with 4 mm nodule December 2015 simple spirometry ratio 51%, FEV1 2.93 L (92% predicted11/2015 CT chest with 4mm nodule and centrilobular emphyseam 07/2014 simple spirometry> Ratio 53% FEV1 2.93L (92% pred)  HPI Chief Complaint  Patient presents with  . Follow-up    pt c/o recurrent headaches and nosebleeds.  no other breathing complaints.    Alycen says she has been taking the [facility] and she says that she has been doing well.  She has been having headaches in the mornings sometimes and a nose bleed.  She sometimes feels tired in the mornings as well.  She feels tired during the daytime but she never is drowsy. She coughs some but not too bad.  She is taking the Allegra regularly.    Past Medical History  Diagnosis Date  . Other general counseling and advice for contraceptive management   . Routine general medical examination at a health care facility   . Screening for ischemic heart disease   . Tobacco use disorder   . Abnormal pap     LEEP 2005     Review of Systems     Objective:   Physical Exam Filed Vitals:   10/10/14 1517  BP: 126/64  Pulse: 95  Height: 5\' 7"  (1.702 m)  Weight: 148 lb (67.132 kg)  SpO2: 97%   RA  Gen: well appearing, no acute distress HEENT: NCAT, EOMi, OP clear, PULM: CTA B CV: RRR, no mgr, no JVD AB: BS+, soft, nontender, Ext: warm, no edema, no clubbing, no cyanosis Derm: no rash or skin breakdown Neuro: A&Ox4, MAEW       Assessment & Plan:   Centrilobular emphysema This has been a stable interval for [name].  She is doing well on the [facility] which is causing a little thrush.  Plan: -continue Breo -quit smoking -use baking-soda based toothepaste -f/u 6 months   Solitary pulmonary nodule Needs CT chest in 06/2015, will order on f/u  visit.   TOBACCO ABUSE She was counseled to quit smoking today.  She does not want to take medical therapy for this right now.     Updated Medication List Outpatient Encounter Prescriptions as of 10/10/2014  Medication Sig  . fexofenadine-pseudoephedrine (ALLEGRA-D 24) 180-240 MG per 24 hr tablet Take 1 tablet by mouth daily.  . Fluticasone Furoate-Vilanterol (BREO ELLIPTA) 200-25 MCG/INH AEPB Inhale 1 puff into the lungs daily.  Marland Kitchen. ibuprofen (ADVIL,MOTRIN) 200 MG tablet Take 800 mg by mouth every 4 (four) hours as needed.  Marland Kitchen. Respiratory Therapy Supplies (FLUTTER) DEVI 1 Device by Does not apply route daily.  . varenicline (CHANTIX STARTING MONTH PAK) 0.5 MG X 11 & 1 MG X 42 tablet Take one 0.5 mg tablet by mouth once daily for 3 days, then increase to one 0.5 mg tablet twice daily for 4 days, then increase to one 1 mg tablet twice daily. (Patient not taking: Reported on 07/06/2014)

## 2014-10-10 NOTE — Addendum Note (Signed)
Addended by: York RamGAY, Shirely Toren on: 10/10/2014 03:44 PM   Modules accepted: Orders

## 2014-11-24 ENCOUNTER — Other Ambulatory Visit: Payer: Self-pay

## 2014-11-24 MED ORDER — FEXOFENADINE-PSEUDOEPHED ER 180-240 MG PO TB24: 1.0000 | ORAL_TABLET | Freq: Every day | ORAL | Status: DC

## 2014-12-20 ENCOUNTER — Ambulatory Visit (INDEPENDENT_AMBULATORY_CARE_PROVIDER_SITE_OTHER): Payer: 59 | Admitting: *Deleted

## 2014-12-20 DIAGNOSIS — Z308 Encounter for other contraceptive management: Secondary | ICD-10-CM | POA: Diagnosis not present

## 2014-12-20 MED ORDER — MEDROXYPROGESTERONE ACETATE 150 MG/ML IM SUSP
150.0000 mg | Freq: Once | INTRAMUSCULAR | Status: AC
  Administered 2014-12-20: 150 mg via INTRAMUSCULAR

## 2014-12-27 ENCOUNTER — Ambulatory Visit (INDEPENDENT_AMBULATORY_CARE_PROVIDER_SITE_OTHER): Payer: 59 | Admitting: Family Medicine

## 2014-12-27 ENCOUNTER — Encounter: Payer: Self-pay | Admitting: Family Medicine

## 2014-12-27 VITALS — BP 128/70 | HR 89 | Temp 98.3°F | Wt 150.5 lb

## 2014-12-27 DIAGNOSIS — J0110 Acute frontal sinusitis, unspecified: Secondary | ICD-10-CM

## 2014-12-27 MED ORDER — BUDESONIDE-FORMOTEROL FUMARATE 160-4.5 MCG/ACT IN AERO: 2.0000 | INHALATION_SPRAY | Freq: Every day | RESPIRATORY_TRACT | Status: DC

## 2014-12-27 MED ORDER — AZITHROMYCIN 250 MG PO TABS: ORAL_TABLET | ORAL | Status: DC

## 2014-12-27 NOTE — Progress Notes (Signed)
SUBJECTIVE:  Makayla Green is a 44 y.o. female who complains of coryza, congestion and bilateral sinus pain for 20 days. She denies a history of anorexia, chest pain, fevers and shortness of breath and admits to a history of asthma. Patient admits to smoke cigarettes.   Current Outpatient Prescriptions on File Prior to Visit  Medication Sig Dispense Refill  . fexofenadine-pseudoephedrine (ALLEGRA-D 24) 180-240 MG per 24 hr tablet Take 1 tablet by mouth daily. 30 tablet 3  . Fluticasone Furoate-Vilanterol (BREO ELLIPTA) 200-25 MCG/INH AEPB Inhale 1 puff into the lungs daily. 60 each 5  . ibuprofen (ADVIL,MOTRIN) 200 MG tablet Take 800 mg by mouth every 4 (four) hours as needed.    Marland Kitchen. Respiratory Therapy Supplies (FLUTTER) DEVI 1 Device by Does not apply route daily. 1 each 0   No current facility-administered medications on file prior to visit.    Allergies  Allergen Reactions  . Penicillins Shortness Of Breath and Swelling  . Amitriptyline     Hallucinations    Past Medical History  Diagnosis Date  . Other general counseling and advice for contraceptive management   . Routine general medical examination at a health care facility   . Screening for ischemic heart disease   . Tobacco use disorder   . Abnormal pap     LEEP 2005    No past surgical history on file.  Family History  Problem Relation Age of Onset  . COPD Mother     History   Social History  . Marital Status: Married    Spouse Name: N/A  . Number of Children: 3  . Years of Education: N/A   Occupational History  . Not on file.   Social History Main Topics  . Smoking status: Current Every Day Smoker -- 1.00 packs/day for 25 years    Types: Cigarettes  . Smokeless tobacco: Never Used     Comment: down to .5 ppd 07/25/14  . Alcohol Use: No  . Drug Use: No  . Sexual Activity: Not on file   Other Topics Concern  . Not on file   Social History Narrative   The PMH, PSH, Social History, Family  History, Medications, and allergies have been [facility], and have been updated if relevant.  OBJECTIVE: BP 128/70 mmHg  Pulse 89  Temp(Src) 98.3 F (36.8 C) (Oral)  Wt 150 lb 8 oz (68.266 kg)  SpO2 98%    She appears well, vital signs are as noted. Ears normal.  Throat and pharynx normal.  Neck supple. No adenopathy in the neck. Nose is congested. Sinuses tender. The chest is clear, without wheezes or rales.  ASSESSMENT:  sinusitis  PLAN: Given duration and progression of symptoms, will treat for bacterial sinusitis with Zpack- PCN allergic.  Symptomatic therapy suggested: push fluids, rest and return office visit prn if symptoms persist or worsen.Call or return to clinic prn if these symptoms worsen or fail to improve as anticipated.

## 2014-12-27 NOTE — Progress Notes (Signed)
Pre visit review using our clinic review tool, if applicable. No additional management support is needed unless otherwise documented below in the visit note. 

## 2015-01-09 ENCOUNTER — Encounter: Payer: Self-pay | Admitting: Family Medicine

## 2015-01-09 ENCOUNTER — Telehealth: Payer: Self-pay

## 2015-01-09 ENCOUNTER — Ambulatory Visit (INDEPENDENT_AMBULATORY_CARE_PROVIDER_SITE_OTHER): Payer: 59 | Admitting: Family Medicine

## 2015-01-09 VITALS — BP 114/62 | HR 99 | Temp 98.1°F | Wt 148.2 lb

## 2015-01-09 DIAGNOSIS — J069 Acute upper respiratory infection, unspecified: Secondary | ICD-10-CM | POA: Diagnosis not present

## 2015-01-09 DIAGNOSIS — J019 Acute sinusitis, unspecified: Secondary | ICD-10-CM | POA: Insufficient documentation

## 2015-01-09 MED ORDER — SULFAMETHOXAZOLE-TRIMETHOPRIM 800-160 MG PO TABS: 1.0000 | ORAL_TABLET | Freq: Two times a day (BID) | ORAL | Status: DC

## 2015-01-09 NOTE — Assessment & Plan Note (Signed)
Deteriorated acutely now with signs of otitis media on exam. Treat course of Bactrim- PCN, doxycyline allergic. Call or return to clinic prn if these symptoms worsen or fail to improve as anticipated. The patient indicates understanding of these issues and agrees with the plan.

## 2015-01-09 NOTE — Telephone Encounter (Signed)
PLEASE NOTE: All timestamps contained within this report are represented as [place] Standard Time. CONFIDENTIALTY NOTICE: This fax transmission is intended only for the addressee. It contains information that is legally privileged, confidential or otherwise protected from use or disclosure. If you are not the intended recipient, you are strictly prohibited from reviewing, disclosing, copying using or disseminating any of this information or taking any action in reliance on or regarding this information. If you have received this fax in error, please notify us immediately by telephone so that we can arrange for its return to us. Phone: [phone], Toll-Free: [phone], Fax: [phone] Page: 1 of 1 Call Id: [id number] [facility] - Client TELEPHONE ADVICE RECORD [facility] Patient Name: Makayla Green Gender: Female DOB: October 27, 1970 Age: [age] Y 7 M 15 D Return Phone Number: Address: City/State/Zip: [place] [facility] [facility] Contact Type Call Caller Name Katelind Caller Phone Number n/a Relationship To Patient Self Is this call to report lab results? No Call Type General Information Initial Comment Caller states wants to make appt, will call back General Information Type Appointment Nurse Assessment Guidelines Guideline Title Affirmed Question Affirmed Notes Nurse Date/Time (Eastern Time) Disp. Time Lamount Cohen(Eastern Time) Disposition Final User 01/09/2015 8:15:17 AM General Information Provided Yes Venita SheffieldMedley, Desiree After Care Instructions Given Call Event Type User Date / Time Description

## 2015-01-09 NOTE — Progress Notes (Signed)
Pre visit review using our clinic review tool, if applicable. No additional management support is needed unless otherwise documented below in the visit note. 

## 2015-01-09 NOTE — Progress Notes (Signed)
Subjective:   Patient ID: Makayla Green, female    DOB: June 25, 1971, 44 y.o.   MRN: [id number]  Makayla Green is a pleasant 44 y.o. year old female who presents to clinic today with URI symptoms and Ear Pain  on 01/09/2015  HPI:  Saw her on 5/25 with URI symptoms.  Note reviewed.  H/o asthma and tobacco abuse with symptoms consistent with sinusitis.  PCN allergic- treated with Zpack.  Symptoms improved initially until 3 days ago when right ear started hurting and sinus pressure acutely worsened.  Never fully resolved.  No fevers.  No wheezes.  No SOB.  Current Outpatient Prescriptions on File Prior to Visit  Medication Sig Dispense Refill  . budesonide-formoterol (SYMBICORT) 160-4.5 MCG/ACT inhaler Inhale 2 puffs into the lungs daily. 1 Inhaler 12  . fexofenadine-pseudoephedrine (ALLEGRA-D 24) 180-240 MG per 24 hr tablet Take 1 tablet by mouth daily. 30 tablet 3  . Fluticasone Furoate-Vilanterol (BREO ELLIPTA) 200-25 MCG/INH AEPB Inhale 1 puff into the lungs daily. 60 each 5  . ibuprofen (ADVIL,MOTRIN) 200 MG tablet Take 800 mg by mouth every 4 (four) hours as needed.    Marland Kitchen Respiratory Therapy Supplies (FLUTTER) DEVI 1 Device by Does not apply route daily. 1 each 0   No current facility-administered medications on file prior to visit.    Allergies  Allergen Reactions  . Penicillins Shortness Of Breath and Swelling  . Amitriptyline     Hallucinations  . Doxycycline     Past Medical History  Diagnosis Date  . Other general counseling and advice for contraceptive management   . Routine general medical examination at a health care facility   . Screening for ischemic heart disease   . Tobacco use disorder   . Abnormal pap     LEEP 2005    No past surgical history on file.  Family History  Problem Relation Age of Onset  . COPD Mother     History   Social History  . Marital Status: Married    Spouse Name: N/A  . Number of Children: 3  . Years of Education:  N/A   Occupational History  . Not on file.   Social History Main Topics  . Smoking status: Current Every Day Smoker -- 1.00 packs/day for 25 years    Types: Cigarettes  . Smokeless tobacco: Never Used     Comment: down to .5 ppd 07/25/14  . Alcohol Use: No  . Drug Use: No  . Sexual Activity: Not on file   Other Topics Concern  . Not on file   Social History Narrative   The PMH, PSH, Social History, Family History, Medications, and allergies have been reviewed in [facility], and have been updated if relevant.   Review of Systems  Constitutional: Negative.   HENT: Positive for ear pain, postnasal drip, rhinorrhea, sinus pressure and sore throat. Negative for drooling, ear discharge, hearing loss, mouth sores, nosebleeds, trouble swallowing and voice change.   Eyes: Negative.   Respiratory: Positive for cough and wheezing. Negative for shortness of breath.   Cardiovascular: Negative.   Endocrine: Negative.   Genitourinary: Negative.   Musculoskeletal: Negative.   Hematological: Negative.   Psychiatric/Behavioral: Negative.   All other systems reviewed and are negative.      Objective:    BP 114/62 mmHg  Pulse 99  Temp(Src) 98.1 F (36.7 C) (Oral)  Wt 148 lb 4 oz (67.246 kg)  SpO2 97%   Physical Exam  Constitutional: She is oriented  to person, place, and time. She appears well-developed and well-nourished. No distress.  HENT:  Head: Normocephalic.  Right Ear: Hearing normal.  Left Ear: A middle ear effusion is present.  Nose: Rhinorrhea present. Right sinus exhibits no maxillary sinus tenderness and no frontal sinus tenderness. Left sinus exhibits no maxillary sinus tenderness and no frontal sinus tenderness.  Eyes: Conjunctivae are normal.  Neck: Normal range of motion.  Cardiovascular: Normal rate.   Pulmonary/Chest: Effort normal and breath sounds normal. No respiratory distress. She has no wheezes. She has no rales.  Musculoskeletal: Normal range of motion. She  exhibits no edema.  Neurological: She is alert and oriented to person, place, and time. No cranial nerve deficit.  Skin: Skin is warm and dry. No erythema.  Psychiatric: She has a normal mood and affect. Her behavior is normal. Judgment and thought content normal.          Assessment & Plan:   Acute upper respiratory infection No Follow-up on file.

## 2015-01-09 NOTE — Telephone Encounter (Signed)
Pt has appt on 01/09/15 at 10:45 AM with Dr Dayton MartesAron.

## 2015-03-16 ENCOUNTER — Ambulatory Visit: Payer: 59

## 2015-03-16 ENCOUNTER — Telehealth: Payer: Self-pay | Admitting: Family Medicine

## 2015-03-16 NOTE — Telephone Encounter (Signed)
Yes she needs followup 

## 2015-03-16 NOTE — Telephone Encounter (Signed)
Patient did not come in for their appointment today for depo inj.  Please let me know if patient needs to be contacted immediately for follow up or no follow up needed.

## 2015-03-20 NOTE — Telephone Encounter (Signed)
Scheduled for 8/17 pt aware  °

## 2015-03-21 ENCOUNTER — Ambulatory Visit (INDEPENDENT_AMBULATORY_CARE_PROVIDER_SITE_OTHER): Payer: 59 | Admitting: *Deleted

## 2015-03-21 DIAGNOSIS — Z308 Encounter for other contraceptive management: Secondary | ICD-10-CM | POA: Diagnosis not present

## 2015-03-21 MED ORDER — MEDROXYPROGESTERONE ACETATE 150 MG/ML IM SUSP
150.0000 mg | Freq: Once | INTRAMUSCULAR | Status: AC
  Administered 2015-03-21: 150 mg via INTRAMUSCULAR

## 2015-04-03 ENCOUNTER — Telehealth: Payer: Self-pay | Admitting: Family Medicine

## 2015-04-03 ENCOUNTER — Ambulatory Visit (INDEPENDENT_AMBULATORY_CARE_PROVIDER_SITE_OTHER): Payer: 59 | Admitting: *Deleted

## 2015-04-03 DIAGNOSIS — Z111 Encounter for screening for respiratory tuberculosis: Secondary | ICD-10-CM | POA: Diagnosis not present

## 2015-04-03 NOTE — Telephone Encounter (Signed)
Yes ok to schedule.

## 2015-04-03 NOTE — Telephone Encounter (Signed)
Scheduled for 08/30. Pt aware

## 2015-04-03 NOTE — Telephone Encounter (Signed)
Pt needs tb test for work. Is this ok?  cb number is [id number]

## 2015-04-03 NOTE — Telephone Encounter (Signed)
It cannot be scheduled on a Thursday. She will have to have it read within 48-72 hours, which make Sat and Sun unavailable

## 2015-04-04 ENCOUNTER — Ambulatory Visit: Payer: 59

## 2015-04-06 LAB — TB SKIN TEST
Induration: 0 mm
TB Skin Test: NEGATIVE

## 2015-07-23 ENCOUNTER — Ambulatory Visit (INDEPENDENT_AMBULATORY_CARE_PROVIDER_SITE_OTHER): Payer: 59 | Admitting: Family Medicine

## 2015-07-23 ENCOUNTER — Encounter: Payer: Self-pay | Admitting: Family Medicine

## 2015-07-23 VITALS — BP 100/60 | HR 72 | Temp 98.4°F | Wt 142.8 lb

## 2015-07-23 DIAGNOSIS — J0100 Acute maxillary sinusitis, unspecified: Secondary | ICD-10-CM

## 2015-07-23 DIAGNOSIS — F17200 Nicotine dependence, unspecified, uncomplicated: Secondary | ICD-10-CM

## 2015-07-23 MED ORDER — PREDNISONE 20 MG PO TABS: ORAL_TABLET | ORAL | Status: DC

## 2015-07-23 MED ORDER — SULFAMETHOXAZOLE-TRIMETHOPRIM 800-160 MG PO TABS: 1.0000 | ORAL_TABLET | Freq: Two times a day (BID) | ORAL | Status: DC

## 2015-07-23 NOTE — Assessment & Plan Note (Signed)
Continue to encourage smoking cessation. Has tried and failed several smoking cessation strategies. Discussed sandwich bag method. Pt may try this.

## 2015-07-23 NOTE — Assessment & Plan Note (Signed)
Given duration and progression of sxs will treat with 10d bactrim DS, prednisone course and plain mucinex. Update if not improving as expected. (PCN and doxy allergy, pt states bactrim has worked well for her in the past). Continue to encourage smoking cessation

## 2015-07-23 NOTE — Progress Notes (Signed)
BP 100/60 mmHg  Pulse 72  Temp(Src) 98.4 F (36.9 C) (Oral)  Wt 142 lb 12 oz (64.751 kg)   CC: sinusitis?  Subjective:    Patient ID: Makayla Green, female    DOB: Nov 03, 1970, 44 y.o.   MRN: [id number]  HPI: Makayla Green is a 44 y.o. female presenting on 07/23/2015 for Sinusitis   Started as sinus pressure headaches to chest congestion, maxillary pressure, sore throat. sxs ongoing for last 3 weeks. +ear pain and PNdrainage. Some night chills.  No fevers, tooth pain, dyspnea or wheezing.   Regular with her allegra D. No sick contacts at home.  + perennial allergies. + COPD. Smoking 1 ppd.   Last year with prolonged illness needing multiple rounds of antibiotics to clear (bronchitis) seen by PCP, ID, pulm.   Intolerant to wellbutrin/chantix. Gum and patches make her sick. E-cig worsened lung function.   Relevant past medical, surgical, family and social history reviewed and updated as indicated. Interim medical history since our last visit reviewed. Allergies and medications reviewed and updated. Current Outpatient Prescriptions on File Prior to Visit  Medication Sig  . budesonide-formoterol (SYMBICORT) 160-4.5 MCG/ACT inhaler Inhale 2 puffs into the lungs daily.  . fexofenadine-pseudoephedrine (ALLEGRA-D 24) 180-240 MG per 24 hr tablet Take 1 tablet by mouth daily.  Marland Kitchen. ibuprofen (ADVIL,MOTRIN) 200 MG tablet Take 800 mg by mouth every 4 (four) hours as needed.  Marland Kitchen. Respiratory Therapy Supplies (FLUTTER) DEVI 1 Device by Does not apply route daily.   No current facility-administered medications on file prior to visit.    Review of Systems Per HPI unless specifically indicated in ROS section     Objective:    BP 100/60 mmHg  Pulse 72  Temp(Src) 98.4 F (36.9 C) (Oral)  Wt 142 lb 12 oz (64.751 kg)  Wt Readings from Last 3 Encounters:  07/23/15 142 lb 12 oz (64.751 kg)  01/09/15 148 lb 4 oz (67.246 kg)  12/27/14 150 lb 8 oz (68.266 kg)    Physical Exam    Constitutional: She appears well-developed and well-nourished. No distress.  HENT:  Head: Normocephalic and atraumatic.  Right Ear: Hearing, tympanic membrane, external ear and ear canal normal.  Left Ear: Hearing, tympanic membrane, external ear and ear canal normal.  Nose: Mucosal edema present. No rhinorrhea. Right sinus exhibits maxillary sinus tenderness. Right sinus exhibits no frontal sinus tenderness. Left sinus exhibits maxillary sinus tenderness. Left sinus exhibits no frontal sinus tenderness.  Mouth/Throat: Uvula is midline and mucous membranes are normal. Posterior oropharyngeal edema and posterior oropharyngeal erythema present. No oropharyngeal exudate or tonsillar abscesses.  Eyes: Conjunctivae and EOM are normal. Pupils are equal, round, and reactive to light. No scleral icterus.  Neck: Normal range of motion. Neck supple. No thyromegaly present.  Cardiovascular: Normal rate, regular rhythm, normal heart sounds and intact distal pulses.   No murmur heard. Pulmonary/Chest: Effort normal and breath sounds normal. No respiratory distress. She has no wheezes. She has no rales.  Slightly coarse in smoker  Lymphadenopathy:    She has no cervical adenopathy.  Skin: Skin is warm and dry. No rash noted.  Nursing note and vitals reviewed.     Assessment & Plan:   Problem List Items Addressed This Visit    TOBACCO ABUSE    Continue to encourage smoking cessation. Has tried and failed several smoking cessation strategies. Discussed sandwich bag method. Pt may try this.      Acute sinusitis - Primary    Given  duration and progression of sxs will treat with 10d bactrim DS, prednisone course and plain mucinex. Update if not improving as expected. (PCN and doxy allergy, pt states bactrim has worked well for her in the past). Continue to encourage smoking cessation      Relevant Medications   predniSONE (DELTASONE) 20 MG tablet   sulfamethoxazole-trimethoprim (BACTRIM DS,SEPTRA  DS) 800-160 MG tablet       Follow up plan: Return if symptoms worsen or fail to improve.

## 2015-07-23 NOTE — Patient Instructions (Signed)
You have a sinus infection. Take medicine as prescribed: bactrim twice daily for 10 days as well as prednisone course. Push fluids and plenty of rest. Nasal saline irrigation or neti pot to help drain sinuses. May use plain mucinex with plenty of fluid to help mobilize mucous. Ok to continue allegra D. Please let us know if fever >101.5, trouble opening/closing mouth, difficulty swallowing, or worsening instead of improving as expected.

## 2015-07-23 NOTE — Progress Notes (Signed)
Pre visit review using our clinic review tool, if applicable. No additional management support is needed unless otherwise documented below in the visit note. 

## 2015-09-17 ENCOUNTER — Ambulatory Visit (INDEPENDENT_AMBULATORY_CARE_PROVIDER_SITE_OTHER): Payer: 59 | Admitting: Family Medicine

## 2015-09-17 ENCOUNTER — Encounter: Payer: Self-pay | Admitting: Family Medicine

## 2015-09-17 VITALS — BP 108/64 | HR 92 | Temp 98.4°F | Wt 139.5 lb

## 2015-09-17 DIAGNOSIS — J0100 Acute maxillary sinusitis, unspecified: Secondary | ICD-10-CM

## 2015-09-17 DIAGNOSIS — F17200 Nicotine dependence, unspecified, uncomplicated: Secondary | ICD-10-CM

## 2015-09-17 DIAGNOSIS — J432 Centrilobular emphysema: Secondary | ICD-10-CM

## 2015-09-17 MED ORDER — SULFAMETHOXAZOLE-TRIMETHOPRIM 800-160 MG PO TABS: 1.0000 | ORAL_TABLET | Freq: Two times a day (BID) | ORAL | Status: DC

## 2015-09-17 NOTE — Progress Notes (Signed)
Pre visit review using our clinic review tool, if applicable. No additional management support is needed unless otherwise documented below in the visit note. 

## 2015-09-17 NOTE — Assessment & Plan Note (Signed)
Acute persistent sinusitis that did not fully resolve after initial bactrim course 07/22/2016. Will prolong course x3wks. Continue plain mucinex. If recurrent, discussed ENT referral. Pt agrees with plan.

## 2015-09-17 NOTE — Progress Notes (Signed)
BP 108/64 mmHg  Pulse 92  Temp(Src) 98.4 F (36.9 C) (Oral)  Wt 139 lb 8 oz (63.277 kg)  SpO2 98%   CC: sinusitis?  Subjective:    Patient ID: Makayla Green, female    DOB: 1971-01-07, 45 y.o.   MRN: [id number]  HPI: Makayla Green is a 45 y.o. female presenting on 09/17/2015 for Sinusitis   See prior notes for details. Seen here 07/23/2015 with dx acute sinusitis - treated with bactrim DS 10d course. Did well on abx then symptoms recurred about 6 wks ago. Ongoing congestion, cough, headache, ongoing R sinus pressure headache. + R ear pain, ST and PNdrainage.   No fevers.   Overusing vicks nasal spray.  Regular with her allegra D.  No sick contacts at home.  + perennial allergies. + COPD.  Smoking decreased to 1/2 ppd.  New long haired dog at home. Allergic to pets.   2015 with prolonged illness needing multiple rounds of antibiotics to clear (bronchitis) seen by PCP, ID, pulm.   PCN/doxy allergy. Bactrim has worked well in the past.   Relevant past medical, surgical, family and social history reviewed and updated as indicated. Interim medical history since our last visit reviewed. Allergies and medications reviewed and updated. Current Outpatient Prescriptions on File Prior to Visit  Medication Sig  . budesonide-formoterol (SYMBICORT) 160-4.5 MCG/ACT inhaler Inhale 2 puffs into the lungs daily.  . fexofenadine-pseudoephedrine (ALLEGRA-D 24) 180-240 MG per 24 hr tablet Take 1 tablet by mouth daily.  Marland Kitchen ibuprofen (ADVIL,MOTRIN) 200 MG tablet Take 800 mg by mouth every 4 (four) hours as needed.  . medroxyPROGESTERone (DEPO-PROVERA) 150 MG/ML injection Inject 150 mg into the muscle every 3 (three) months.  . Respiratory Therapy Supplies (FLUTTER) DEVI 1 Device by Does not apply route daily.   No current facility-administered medications on file prior to visit.    Review of Systems Per HPI unless specifically indicated in ROS section     Objective:    BP  108/64 mmHg  Pulse 92  Temp(Src) 98.4 F (36.9 C) (Oral)  Wt 139 lb 8 oz (63.277 kg)  SpO2 98%  Wt Readings from Last 3 Encounters:  09/17/15 139 lb 8 oz (63.277 kg)  07/23/15 142 lb 12 oz (64.751 kg)  01/09/15 148 lb 4 oz (67.246 kg)    Physical Exam  Constitutional: She appears well-developed and well-nourished. No distress.  HENT:  Head: Normocephalic and atraumatic.  Right Ear: Hearing, tympanic membrane, external ear and ear canal normal.  Left Ear: Hearing, tympanic membrane, external ear and ear canal normal.  Nose: Mucosal edema present. No rhinorrhea. Right sinus exhibits no maxillary sinus tenderness and no frontal sinus tenderness. Left sinus exhibits maxillary sinus tenderness. Left sinus exhibits no frontal sinus tenderness.  Mouth/Throat: Uvula is midline, oropharynx is clear and moist and mucous membranes are normal. No oropharyngeal exudate, posterior oropharyngeal edema, posterior oropharyngeal erythema or tonsillar abscesses.  Nasal mucosal congestion/inflammation R>L  Eyes: Conjunctivae and EOM are normal. Pupils are equal, round, and reactive to light. No scleral icterus.  Neck: Normal range of motion. Neck supple.  Cardiovascular: Normal rate, regular rhythm, normal heart sounds and intact distal pulses.   No murmur heard. Pulmonary/Chest: Effort normal. No respiratory distress. She has no wheezes. She has rhonchi (RUL, clear with deep cough). She has no rales.  Lymphadenopathy:    She has no cervical adenopathy.  Skin: Skin is warm and dry. No rash noted.  Nursing note and vitals reviewed.  Assessment & Plan:   Problem List Items Addressed This Visit    TOBACCO ABUSE    Continue to encourage cessation. Went from 1 ppd to 1/2 ppd over last 2 months.       Centrilobular emphysema (HCC)   Relevant Medications   fluticasone (FLONASE) 50 MCG/ACT nasal spray   Acute sinusitis - Primary    Acute persistent sinusitis that did not fully resolve after initial  bactrim course 07/22/2016. Will prolong course x3wks. Continue plain mucinex. If recurrent, discussed ENT referral. Pt agrees with plan.       Relevant Medications   fluticasone (FLONASE) 50 MCG/ACT nasal spray   sulfamethoxazole-trimethoprim (BACTRIM DS,SEPTRA DS) 800-160 MG tablet       Follow up plan: Return if symptoms worsen or fail to improve.

## 2015-09-17 NOTE — Patient Instructions (Addendum)
You have another sinus infection.  Treat with 3 wk course of bactrim.  Plain mucinex with plenty of water. Push fluids and rest.  Let us know if not improving - for ENT referral.

## 2015-09-17 NOTE — Assessment & Plan Note (Signed)
Continue to encourage cessation. Went from 1 ppd to 1/2 ppd over last 2 months.

## 2015-09-26 ENCOUNTER — Telehealth: Payer: Self-pay | Admitting: Family Medicine

## 2015-09-26 MED ORDER — LEVOFLOXACIN 500 MG PO TABS: 500.0000 mg | ORAL_TABLET | Freq: Every day | ORAL | Status: DC

## 2015-09-26 NOTE — Telephone Encounter (Signed)
Pt left message, was prescribed antibiotic on 09/17/15 for acute sinus infection.  She has not taken medication yesterday or today due to stomach issues.  She has tried to eat with food and without and says it has still been rough on her stomach.  She is requesting that the sulfamethoxazole-trimethoprim (BACTRIM DS,SEPTRA DS) 800-160 MG tablet [[id number]] be changed to another medication.  Pharmacy of choice is [facility]. Best number to call is [phone].

## 2015-09-26 NOTE — Telephone Encounter (Signed)
Tried to call pt. Mail box is full and not able to leave a message.

## 2015-09-26 NOTE — Telephone Encounter (Signed)
Stop bactrim. Take levaquin course - sent to pharmacy.  Return if persistent symptoms.

## 2015-10-02 NOTE — Telephone Encounter (Signed)
Spoke with patient and she had picked up med from pharmacy and was feeling better.

## 2015-10-09 ENCOUNTER — Encounter: Payer: Self-pay | Admitting: Pulmonary Disease

## 2015-10-10 NOTE — Telephone Encounter (Signed)
BQ - please advise. Thanks. 

## 2015-10-11 ENCOUNTER — Encounter: Payer: Self-pay | Admitting: Family Medicine

## 2015-10-16 ENCOUNTER — Encounter: Payer: Self-pay | Admitting: Family Medicine

## 2015-10-17 NOTE — Telephone Encounter (Signed)
Rx faxed and daughter notified.  

## 2015-10-17 NOTE — Telephone Encounter (Signed)
See my chart message

## 2015-10-22 ENCOUNTER — Ambulatory Visit: Payer: Self-pay | Admitting: Family Medicine

## 2015-10-23 ENCOUNTER — Encounter: Payer: Self-pay | Admitting: Family Medicine

## 2015-10-23 ENCOUNTER — Ambulatory Visit (INDEPENDENT_AMBULATORY_CARE_PROVIDER_SITE_OTHER): Payer: 59 | Admitting: Family Medicine

## 2015-10-23 VITALS — BP 108/68 | HR 94 | Temp 98.0°F | Wt 137.0 lb

## 2015-10-23 DIAGNOSIS — R911 Solitary pulmonary nodule: Secondary | ICD-10-CM | POA: Diagnosis not present

## 2015-10-23 DIAGNOSIS — I2510 Atherosclerotic heart disease of native coronary artery without angina pectoris: Secondary | ICD-10-CM | POA: Insufficient documentation

## 2015-10-23 DIAGNOSIS — Z308 Encounter for other contraceptive management: Secondary | ICD-10-CM | POA: Diagnosis not present

## 2015-10-23 DIAGNOSIS — E041 Nontoxic single thyroid nodule: Secondary | ICD-10-CM | POA: Diagnosis not present

## 2015-10-23 LAB — COMPREHENSIVE METABOLIC PANEL
ALK PHOS: 64 U/L (ref 39–117)
ALT: 14 U/L (ref 0–35)
AST: 15 U/L (ref 0–37)
Albumin: 4.5 g/dL (ref 3.5–5.2)
BILIRUBIN TOTAL: 0.3 mg/dL (ref 0.2–1.2)
BUN: 11 mg/dL (ref 6–23)
CALCIUM: 10.2 mg/dL (ref 8.4–10.5)
CO2: 31 meq/L (ref 19–32)
Chloride: 102 mEq/L (ref 96–112)
Creatinine, Ser: 0.69 mg/dL (ref 0.40–1.20)
GFR: 98.51 mL/min (ref >60.00)
GLUCOSE: 122 mg/dL — AB (ref 70–99)
POTASSIUM: 4 meq/L (ref 3.5–5.1)
Sodium: 139 mEq/L (ref 135–145)
TOTAL PROTEIN: 7.7 g/dL (ref 6.0–8.3)

## 2015-10-23 LAB — LIPID PANEL
CHOL/HDL RATIO: 3
Cholesterol: 197 mg/dL (ref 0–200)
HDL: 62 mg/dL (ref >39.00)
LDL Cholesterol: 112 mg/dL — ABNORMAL HIGH (ref 0–99)
NONHDL: 134.96
TRIGLYCERIDES: 116 mg/dL (ref 0.0–149.0)
VLDL: 23.2 mg/dL (ref 0.0–40.0)

## 2015-10-23 MED ORDER — MEDROXYPROGESTERONE ACETATE 150 MG/ML IM SUSP
150.0000 mg | Freq: Once | INTRAMUSCULAR | Status: AC
  Administered 2015-10-23: 150 mg via INTRAMUSCULAR

## 2015-10-23 MED ORDER — ROSUVASTATIN CALCIUM 5 MG PO TABS: 5.0000 mg | ORAL_TABLET | Freq: Every day | ORAL | Status: DC

## 2015-10-23 NOTE — Patient Instructions (Addendum)
Great to see you. Please stop by to see Makayla Green on your way out after you go to the lab.  Please return for labs in 8 weeks.

## 2015-10-23 NOTE — Assessment & Plan Note (Signed)
She would like to defer thyroid US at this time and proceed with CT first. She is asymptomatic.

## 2015-10-23 NOTE — Progress Notes (Signed)
Subjective:   Patient ID: Makayla Green, female    DOB: 26-Jun-1971, 45 y.o.   MRN: [id number]  Makayla Green is a pleasant 45 y.o. year old female who presents to clinic today with Follow-up  on 10/23/2015  HPI: Here to go over results from CT of chest that I did not order (ordered by pulm) in 06/2014.  CLINICAL DATA: Patient c/o congestion, cough, fever + SOB  intermittent since 6/15. She states she was diagnosed with  bronchitis and given antibiotic and her symptoms clear up but once  she is finished with antibiotic the symptoms come back. No hx CA and  no hx thoracic surg. NKI. hx smoker x 24 years, 1 pack a day.   EXAM:  CT CHEST WITH CONTRAST   TECHNIQUE:  Multidetector CT imaging of the chest was performed during  intravenous contrast administration.   CONTRAST: 75 cc Isovue 370   COMPARISON: None.   FINDINGS:  Lungs/Pleura: Moderate to severe centrilobular emphysema.   Subpleural right lower lobe density measures 4 mm and is favored to  represent an area of scarring. Example image 59.   No pleural fluid.   Heart/Mediastinum: Posterior left thyroid nodule which measures 1.2  cm on image 5.   Normal heart size, without pericardial effusion. Proximal LAD  calcified coronary atherosclerosis. Example image 35 series 2. No  mediastinal or hilar adenopathy.   Soft tissue density in the anterior mediastinum is likely residual  thymus.   Upper Abdomen: Normal imaged portions of the liver, spleen, stomach,  pancreas, adrenal glands.   Bones/Musculoskeletal: No acute osseous abnormality.   IMPRESSION:  1. Advanced centrilobular emphysema. No explanation for congestion,  fever.  2. Markedly age advanced LAD coronary artery atherosclerosis.  Correlate with risk factors and consider medical therapy and/or  cardiology consultation.  3. 4 mm focus of probable nodular scarring at the right lung base.   Given risk factors for bronchogenic carcinoma, follow-up chest CT at  1 year is recommended. This recommendation follows the consensus  statement: "Guidelines for Management of Small Pulmonary Nodules  Detected on CT Scans: A Statement from the Fleischner Society" as  published in Radiology 2005; 237:395-400. Available online at:  [email or web address].[employer].edu/rad/res/Fleischner-nodule.htm.  4. Indeterminate left thyroid nodule. Consider further evaluation  with ultrasound.    Electronically Signed   By: Jeronimo GreavesKyle Talbot M.D.   On: 06/07/2014 13:20     Review of Systems  HENT: Negative for trouble swallowing.   Respiratory: Positive for cough and shortness of breath.   Cardiovascular: Negative.   Endocrine: Negative for cold intolerance, heat intolerance, polyphagia and polyuria.  Musculoskeletal: Negative.   Skin: Negative.   Neurological: Negative.   Hematological: Negative.   Psychiatric/Behavioral: Negative.   All other systems reviewed and are negative.      Objective:    BP 108/68 mmHg  Pulse 94  Temp(Src) 98 F (36.7 C) (Oral)  Wt 137 lb (62.143 kg)  SpO2 97%   Physical Exam  Constitutional: She is oriented to person, place, and time. She appears well-developed and well-nourished. No distress.  HENT:  Head: Normocephalic and atraumatic.  Eyes: Conjunctivae are normal.  Cardiovascular: Normal rate.   Pulmonary/Chest: Effort normal.  Musculoskeletal: Normal range of motion.  Neurological: She is alert and oriented to person, place, and time. No cranial nerve deficit.  Skin: Skin is warm and dry. She is not diaphoretic.  Psychiatric: She has a normal mood and affect. Her behavior is normal. Judgment and thought content normal.  Nursing note and vitals reviewed.         Assessment & Plan:   Atherosclerosis of native coronary artery of native heart without angina pectoris - Plan: Ambulatory referral to Cardiology, Lipid panel, CT Chest W  Contrast  Lung nodule - Plan: CT Chest W Contrast, Comprehensive metabolic panel  Thyroid nodule No Follow-up on file.

## 2015-10-23 NOTE — Assessment & Plan Note (Signed)
Overdue for follow up CT- referral placed.

## 2015-10-23 NOTE — Assessment & Plan Note (Addendum)
Start Crestor 5 mg daily. Check lipid panel Refer to cardiology. The patient indicates understanding of these issues and agrees with the plan. >25 minutes spent in face to face time with patient, >50% spent in counselling or coordination of care

## 2015-10-24 ENCOUNTER — Encounter: Payer: Self-pay | Admitting: Family Medicine

## 2015-10-25 ENCOUNTER — Encounter: Payer: Self-pay | Admitting: Family Medicine

## 2015-10-29 ENCOUNTER — Encounter: Payer: Self-pay | Admitting: Family Medicine

## 2015-10-30 ENCOUNTER — Encounter: Payer: Self-pay | Admitting: Family Medicine

## 2015-10-30 ENCOUNTER — Ambulatory Visit
Admission: RE | Admit: 2015-10-30 | Discharge: 2015-10-30 | Disposition: A | Discharge status: Home / Self Care | Payer: 59 | Source: Ambulatory Visit | Attending: Family Medicine | Admitting: Family Medicine

## 2015-10-30 DIAGNOSIS — R918 Other nonspecific abnormal finding of lung field: Secondary | ICD-10-CM | POA: Insufficient documentation

## 2015-10-30 DIAGNOSIS — I2510 Atherosclerotic heart disease of native coronary artery without angina pectoris: Secondary | ICD-10-CM | POA: Diagnosis not present

## 2015-10-30 DIAGNOSIS — E042 Nontoxic multinodular goiter: Secondary | ICD-10-CM | POA: Insufficient documentation

## 2015-10-30 DIAGNOSIS — R911 Solitary pulmonary nodule: Secondary | ICD-10-CM | POA: Diagnosis present

## 2015-10-30 MED ORDER — IOPAMIDOL (ISOVUE-300) INJECTION 61%
75.0000 mL | Freq: Once | INTRAVENOUS | Status: AC | PRN
  Administered 2015-10-30: 75 mL via INTRAVENOUS

## 2015-10-31 ENCOUNTER — Other Ambulatory Visit: Payer: Self-pay | Admitting: Family Medicine

## 2015-10-31 DIAGNOSIS — E041 Nontoxic single thyroid nodule: Secondary | ICD-10-CM

## 2015-11-08 ENCOUNTER — Ambulatory Visit
Admission: RE | Admit: 2015-11-08 | Discharge: 2015-11-08 | Disposition: A | Discharge status: Home / Self Care | Payer: 59 | Source: Ambulatory Visit | Attending: Family Medicine | Admitting: Family Medicine

## 2015-11-08 ENCOUNTER — Telehealth: Payer: Self-pay | Admitting: Cardiology

## 2015-11-08 ENCOUNTER — Encounter: Payer: Self-pay | Admitting: Cardiology

## 2015-11-08 ENCOUNTER — Encounter: Payer: Self-pay | Admitting: Family Medicine

## 2015-11-08 ENCOUNTER — Ambulatory Visit (INDEPENDENT_AMBULATORY_CARE_PROVIDER_SITE_OTHER): Payer: 59 | Admitting: Cardiology

## 2015-11-08 VITALS — BP 100/64 | HR 82 | Ht 69.0 in | Wt 142.2 lb

## 2015-11-08 DIAGNOSIS — Z01812 Encounter for preprocedural laboratory examination: Secondary | ICD-10-CM

## 2015-11-08 DIAGNOSIS — F17200 Nicotine dependence, unspecified, uncomplicated: Secondary | ICD-10-CM

## 2015-11-08 DIAGNOSIS — E041 Nontoxic single thyroid nodule: Secondary | ICD-10-CM

## 2015-11-08 DIAGNOSIS — I2510 Atherosclerotic heart disease of native coronary artery without angina pectoris: Secondary | ICD-10-CM

## 2015-11-08 DIAGNOSIS — I25119 Atherosclerotic heart disease of native coronary artery with unspecified angina pectoris: Secondary | ICD-10-CM | POA: Diagnosis not present

## 2015-11-08 MED ORDER — ISOSORBIDE MONONITRATE ER 30 MG PO TB24: 30.0000 mg | ORAL_TABLET | Freq: Every day | ORAL | Status: DC

## 2015-11-08 MED ORDER — ASPIRIN EC 81 MG PO TBEC: 81.0000 mg | DELAYED_RELEASE_TABLET | Freq: Every day | ORAL | Status: AC

## 2015-11-08 NOTE — Telephone Encounter (Signed)
Pharmacist from [facility] [place] and needs to clarify directions on isosorbide mononitrate (IMDUR) 30 MG 24 hr tablet.  # [phone]

## 2015-11-08 NOTE — Progress Notes (Signed)
Cardiology Office Note   Date:  11/08/2015   ID:  Makayla Green, DOB [birth year], MRN [id number]  Referring Doctor:  Ruthe Mannanalia Aron, MD   Cardiologist:   Almond LintAileen Zakyah Yanes, MD   Reason for consultation:  Chief Complaint  Patient presents with  . other    CAD c/o back shoulder blade burning sensation radiates to left arm. Meds reviewed verbally with pt.      History of Present Illness: Makayla Green is a 45 y.o. female who presents for CAD, back pain the radiating to the left arm.  Patient reports this sharp stabbing back pain that is radiating down to the left arm. This going on for over a year now. On and off throughout that time. Occurs minutes at a time, resolved spontaneously. Sometimes exertional in nature. Associated with shortness of breath. Moderate in intensity.  In 2015 she was being treated for bronchitis. Patient recalls the CT scan was done and was told that they found evidence of heart disease on that CT scan. She had another CT scan recently that shows persistence of this finding and wants cardiology input.  No loss of consciousness, cough, colds, fever, abdominal pain, PND, orthopnea, edema.  ROS:  Please see the history of present illness. Aside from mentioned under HPI, all other systems are reviewed and negative.     Past Medical History  Diagnosis Date  . Other general counseling and advice for contraceptive management   . Routine general medical examination at a health care facility   . Screening for ischemic heart disease   . Tobacco use disorder   . Abnormal pap     LEEP 2005  . Coronary artery disease   . Asthma     History reviewed. No pertinent past surgical history.   reports that she has been smoking Cigarettes.  She has a 25 pack-year smoking history. She has never used smokeless tobacco. She reports that she does not drink alcohol or use illicit drugs.   family history includes COPD in her mother; Heart disease in her mother.    Current Outpatient Prescriptions  Medication Sig Dispense Refill  . budesonide-formoterol (SYMBICORT) 160-4.5 MCG/ACT inhaler Inhale 2 puffs into the lungs daily. (Patient taking differently: Inhale 2 puffs into the lungs as needed. ) 1 Inhaler 12  . fexofenadine-pseudoephedrine (ALLEGRA-D 24) 180-240 MG per 24 hr tablet Take 1 tablet by mouth daily. (Patient taking differently: Take 1 tablet by mouth as needed. ) 30 tablet 3  . fluticasone (FLONASE) 50 MCG/ACT nasal spray Place 2 sprays into both nostrils as needed.     Marland Kitchen. ibuprofen (ADVIL,MOTRIN) 200 MG tablet Take 800 mg by mouth every 4 (four) hours as needed.    . medroxyPROGESTERone (DEPO-PROVERA) 150 MG/ML injection Inject 150 mg into the muscle every 3 (three) months.    . rosuvastatin (CRESTOR) 5 MG tablet Take 1 tablet (5 mg total) by mouth daily. 90 tablet 3  . aspirin EC 81 MG tablet Take 1 tablet (81 mg total) by mouth daily. 90 tablet 3  . isosorbide mononitrate (IMDUR) 30 MG 24 hr tablet Take 1 tablet (30 mg total) by mouth daily. Take 1/2 tablet Once daily 30 tablet 11   No current facility-administered medications for this visit.    Allergies: Penicillins; Amitriptyline; Doxycycline; and Shellfish allergy    PHYSICAL EXAM: VS:  BP 100/64 mmHg  Pulse 82  Ht 5\' 9"  (1.753 m)  Wt 142 lb 4 oz (64.524 kg)  BMI 21.00 kg/m2 ,  Body mass index is 21 kg/(m^2). Wt Readings from Last 3 Encounters:  11/08/15 142 lb 4 oz (64.524 kg)  10/23/15 137 lb (62.143 kg)  09/17/15 139 lb 8 oz (63.277 kg)    GENERAL:  well developed, well nourished,obese, not in acute distress HEENT: normocephalic, pink conjunctivae, anicteric sclerae, no xanthelasma, normal dentition, oropharynx clear NECK:  no neck vein engorgement, JVP normal, no hepatojugular reflux, carotid upstroke brisk and symmetric, no bruit, no thyromegaly, no lymphadenopathy LUNGS:  good respiratory effort, clear to auscultation bilaterally CV:  PMI not displaced, no thrills, no  lifts, S1 and S2 within normal limits, no palpable S3 or S4, no murmurs, no rubs, no gallops ABD:  Soft, nontender, nondistended, normoactive bowel sounds, no abdominal aortic bruit, no hepatomegaly, no splenomegaly MS: nontender back, no kyphosis, no scoliosis, no joint deformities EXT:  2+ DP/PT pulses, no edema, no varicosities, no cyanosis, no clubbing SKIN: warm, nondiaphoretic, normal turgor, no ulcers NEUROPSYCH: alert, oriented to person, place, and time, sensory/motor grossly intact, normal mood, appropriate affect  Recent Labs: 10/23/2015: ALT 14; BUN 11; Creatinine, Ser 0.69; Potassium 4.0; Sodium 139   Lipid Panel    Component Value Date/Time   CHOL 197 10/23/2015 1333   TRIG 116.0 10/23/2015 1333   HDL 62.00 10/23/2015 1333   CHOLHDL 3 10/23/2015 1333   VLDL 23.2 10/23/2015 1333   LDLCALC 112* 10/23/2015 1333     Other studies Reviewed:  EKG:  The ekg from04/01/2016 was personally reviewed by me and it revealed sinus rhythm 82 BPM  Additional studies/ records that were reviewed personally reviewed by me today include: None available  ASSESSMENT AND PLAN:  Back pain radiating to left arm/chest pain  Possible CAD Findings of Proximal LAD calcified coronary atherosclerosis from CT chest 06/07/2014. This was also noted on CT chest from 10/30/2015.  Agree with aspirin 81 mg by mouth daily. Agree with Crestor for now. LDL goal is LDL less than 70. Recommend Imdur  half a tablet daily for now. Monitor for side effects and blood pressure.   Because of the presence of calcium or coronary atherosclerosis noted in the proximal LAD, recommend proceed with further investigation with left heart catheterization. Risks and benefits of cardiac catheterization have been discussed with the patient.  These include less than 1% chance of major complications including but not limited to bleeding, infection, kidney damage, arrhyhthmia, heart attack, stroke, and death.  The patient  understands these risks and is willing to proceed.  Tobacco use Smoking cessation recommended  Current medicines are reviewed at length with the patient today.  The patient does not have concerns regarding medicines.  Labs/ tests ordered today include:  Orders Placed This Encounter  Procedures  . EKG 12-Lead    I had a lengthy and detailed discussion with the patient regarding diagnoses, prognosis, diagnostic options, treatment options , and side effects of medications.   I counseled the patient on importance of lifestyle modification including heart healthy diet, regular physical activity  , and smoking cessation.   Disposition:   FU with undersigned after tests   Signed, Almond Lint, MD  11/08/2015 1:07 PM    [facility]

## 2015-11-08 NOTE — Telephone Encounter (Signed)
Spoke to patient and let her know that we are in the process of scheduling her cath for next Wednesday and we need to get some other things done prior to her procedure. Let her know that I would be calling her tomorrow to get all of that scheduled and give her more detailed times and instructions. She agreed with plan and had no further questions at this time.

## 2015-11-08 NOTE — Patient Instructions (Addendum)
Medication Instructions:  Your physician has recommended you make the following change in your medication:  1. Start Aspirin 81 mg Once Daily 2. Start Imdur 30 mg 1/2 tablet Once Daily   Labwork: None ordered   Testing/Procedures: Your physician has requested that you have a cardiac catheterization. Cardiac catheterization is used to diagnose and/or treat various heart conditions. Doctors may recommend this procedure for a number of different reasons. The most common reason is to evaluate chest pain. Chest pain can be a symptom of coronary artery disease (CAD), and cardiac catheterization can show whether plaque is narrowing or blocking your heart's arteries. This procedure is also used to evaluate the valves, as well as measure the blood flow and oxygen levels in different parts of your heart. For further information please visit [email or web address] Please follow instruction sheet, as given.  We will call with instructions.    Follow-Up: Your physician recommends that you schedule a follow-up appointment after testing.  Date & Time: _________________________________________________________________________   Any Other Special Instructions Will Be Listed Below (If Applicable).     If you need a refill on your cardiac medications before your next appointment, please call your pharmacy.  Coronary Angiogram A coronary angiogram, also called coronary angiography, is an X-ray procedure used to look at the arteries in the heart. In this procedure, a dye (contrast dye) is injected through a long, hollow tube (catheter). The catheter is about the size of a piece of cooked spaghetti and is inserted through your groin, wrist, or arm. The dye is injected into each artery, and X-rays are then taken to show if there is a blockage in the arteries of your heart. LET [facility] CARE PROVIDER KNOW ABOUT: 1. Any allergies you have, including allergies to shellfish or contrast dye.  2. All medicines  you are taking, including vitamins, herbs, eye drops, creams, and over-the-counter medicines.  3. Previous problems you or members of your family have had with the use of anesthetics.  4. Any blood disorders you have.  5. Previous surgeries you have had. 6. History of kidney problems or failure.  7. Other medical conditions you have. RISKS AND COMPLICATIONS  Generally, a coronary angiogram is a safe procedure. However, problems can occur and include:  Allergic reaction to the dye.  Bleeding from the access site or other locations.  Kidney injury, especially in people with impaired kidney function.  Stroke (rare).  Heart attack (rare). BEFORE THE PROCEDURE   Do not eat or drink anything after midnight the night before the procedure or as directed by your health care provider.   Ask your health care provider about changing or stopping your regular medicines. This is especially important if you are taking diabetes medicines or blood thinners. PROCEDURE  You may be given a medicine to help you relax (sedative) before the procedure. This medicine is given through an intravenous (IV) access tube that is inserted into one of your veins.   The area where the catheter will be inserted will be washed and shaved. This is usually done in the groin but may be done in the fold of your arm (near your elbow) or in the wrist.   A medicine will be given to numb the area where the catheter will be inserted (local anesthetic).   The health care provider will insert the catheter into an artery. The catheter will be guided by using a special type of X-ray (fluoroscopy) of the blood vessel being examined.   A special dye will  then be injected into the catheter, and X-rays will be taken. The dye will help to show where any narrowing or blockages are located in the heart arteries.  AFTER THE PROCEDURE   If the procedure is done through the leg, you will be kept in bed lying flat for several  hours. You will be instructed to not bend or cross your legs.  The insertion site will be checked frequently.   The pulse in your feet or wrist will be checked frequently.   Additional blood tests, X-rays, and an electrocardiogram may be done.    This information is not intended to replace advice given to you by your health care provider. Make sure you discuss any questions you have with your health care provider.   Document Released: 01/25/2003 Document Revised: 08/11/2014 Document Reviewed: 12/13/2012 Elsevier Interactive Patient Education 2016 Else[employer]. Coronary Angiogram With Stent, Care After Refer to this sheet in the next few weeks. These instructions provide you with information about caring for yourself after your procedure. Your health care provider may also give you more specific instructions. Your treatment has been planned according to current medical practices, but problems sometimes occur. Call your health care provider if you have any problems or questions after your procedure. WHAT TO EXPECT AFTER THE PROCEDURE  After your procedure, it is typical to have the following: 8. Bruising at the catheter insertion site that usually fades within 1-2 weeks. 9. Blood collecting in the tissue (hematoma) that may be painful to the touch. It should usually decrease in size and tenderness within 1-2 weeks. HOME CARE INSTRUCTIONS  Take medicines only as directed by your health care provider. Blood thinners may be prescribed after your procedure to improve blood flow through the stent.  You may shower 24-48 hours after the procedure or as directed by your health care provider. Remove the bandage (dressing) and gently wash the catheter insertion site with plain soap and water. Pat the area dry with a clean towel. Do not rub the site, because this may cause bleeding.  Do not take baths, swim, or use a hot tub until your health care provider approves.  Check your catheter insertion  site every day for redness, swelling, or drainage.  Do not apply powder or lotion to the site.  Do not lift over 10 lb (4.5 kg) for 5 days after your procedure or as directed by your health care provider.  Ask your health care provider when it is okay to:  Return to work or school.  Resume usual physical activities or sports.  Resume sexual activity.  Eat a heart-healthy diet. This should include plenty of fresh fruits and vegetables. Meat should be lean cuts. Avoid the following types of food:  Food that is high in salt.  Canned or highly processed food.  Food that is high in saturated fat or sugar.  Fried food.  Make any other lifestyle changes as recommended by your health care provider. These may include:  Not using any tobacco products, including cigarettes, chewing tobacco, or electronic cigarettes.If you need help quitting, ask your health care provider.  Managing your weight.  Getting regular exercise.  Managing your blood pressure.  Limiting your alcohol intake.  Managing other health problems, such as diabetes.  If you need an MRI after your heart stent has been placed, be sure to tell the health care provider who orders the MRI that you have a heart stent.  Keep all follow-up visits as directed by your health care  provider. This is important. SEEK MEDICAL CARE IF:  You have a fever.  You have chills.  You have increased bleeding from the catheter insertion site. Hold pressure on the site. SEEK IMMEDIATE MEDICAL CARE IF:  You develop chest pain or shortness of breath, feel faint, or pass out.  You have unusual pain at the catheter insertion site.  You have redness, warmth, or swelling at the catheter insertion site.  You have drainage (other than a small amount of blood on the dressing) from the catheter insertion site.  The catheter insertion site is bleeding, and the bleeding does not stop after 30 minutes of holding steady pressure on the  site.  You develop bleeding from any other place, such as from your rectum. There may be bright red blood in your urine or stool, or it may appear as black, tarry stool.   This information is not intended to replace advice given to you by your health care provider. Make sure you discuss any questions you have with your health care provider.   Document Released: 02/07/2005 Document Revised: 08/11/2014 Document Reviewed: 12/13/2012 Elsevier Interactive Patient Education 2016 Elsevier Inc. Coronary Angiogram With Stent Coronary angiography with stent placement is a procedure to widen or open a narrow blood vessel of the heart (coronary artery). When a coronary artery becomes partially blocked, it decreases blood flow to that area. This may lead to chest pain or a heart attack (myocardial infarction). Arteries may become blocked by cholesterol buildup (plaque) in the lining or wall.  A stent is a small piece of metal that looks like a mesh or a spring. Stent placement may be done right after a coronary angiography in which a blocked artery is found or as a treatment for a heart attack.  [facility] KNOW ABOUT: 10. Any allergies you have.  11. All medicines you are taking, including vitamins, herbs, eye drops, creams, and over-the-counter medicines.  12. Previous problems you or members of your family have had with the use of anesthetics.  13. Any blood disorders you have.  14. Previous surgeries you have had.  15. Medical conditions you have. RISKS AND COMPLICATIONS Generally, coronary angiography with stent is a safe procedure. However, problems can occur and include:  Damage to the heart or its blood vessels.   A return of blockage.   Bleeding, infection, or bruising at the insertion site.   A collection of blood under the skin (hematoma) at the insertion site.  Blood clot in another part of the body.   Kidney injury.   Allergic reaction to the dye or  contrast used.   Bleeding into the abdomen (retroperitoneal bleeding). BEFORE THE PROCEDURE  Do not eat or drink anything after midnight on the night before the procedure or as directed by your health care provider.  Ask your health care provider about changing or stopping your regular medicines. This is especially important if you are taking diabetes medicines or blood thinners.  Your health care provider will make sure you understand the procedure as well as the risks and potential problems associated with the procedure.  PROCEDURE  You may be given a medicine to help you relax before and during the procedure (sedative). This medicine will be given through an IV tube that is put into one of your veins.   The area where the catheter will be inserted will be shaved and cleaned. This is usually done in the groin but may be done in the fold of  your arm (near your elbow) or in the wrist.   A medicine will be given to numb the area where the catheter will be inserted (local anesthetic).   The catheter will be inserted into an artery using a guide wire. A type of X-ray (fluoroscopy) will be used to help guide the catheter to the opening of the blocked artery.   A dye will then be injected into the catheter, and X-rays will be taken. The dye will help to show where any narrowing or blockages are located in the heart arteries.   A tiny wire will be guided to the blocked spot, and a balloon will be inflated to make the artery wider. The stent will be expanded and will crush the plaque into the wall of the vessel. The stent will hold the area open like a scaffolding and improve the blood flow.   Sometimes the artery may be made wider using a laser or other tools to remove plaque.   When the blood flow is better, the catheter will be removed. The lining of the artery will grow over the stent, which stays where it was placed.  AFTER THE PROCEDURE  If the procedure is done through the  leg, you will be kept in bed lying flat for about 6 hours. You will be instructed to not bend or cross your legs.   The insertion site will be checked frequently.   The pulse in your feet or wrist will be checked frequently.   Additional blood tests, X-rays, and electrocardiography may be done.   This information is not intended to replace advice given to you by your health care provider. Make sure you discuss any questions you have with your health care provider.   Document Released: 01/25/2003 Document Revised: 08/11/2014 Document Reviewed: 12/13/2012 Elsevier Interactive Patient Education [employer]

## 2015-11-08 NOTE — Telephone Encounter (Signed)
Clarified with pharmacist that imdur 30 mg tablet and the patient is to take 1/2 tablet daily. She verbalized understanding and had no further questions.

## 2015-11-09 ENCOUNTER — Telehealth: Payer: Self-pay | Admitting: Cardiology

## 2015-11-09 ENCOUNTER — Encounter: Payer: Self-pay | Admitting: *Deleted

## 2015-11-09 ENCOUNTER — Telehealth: Payer: Self-pay | Admitting: Family Medicine

## 2015-11-09 ENCOUNTER — Other Ambulatory Visit: Payer: Self-pay | Admitting: Family Medicine

## 2015-11-09 DIAGNOSIS — E079 Disorder of thyroid, unspecified: Secondary | ICD-10-CM

## 2015-11-09 NOTE — Addendum Note (Signed)
Addended by: Bryna ColanderALLEN, Makeila Yamaguchi S on: 11/09/2015 12:10 PM   Modules accepted: Orders

## 2015-11-09 NOTE — Telephone Encounter (Signed)
No need to pre-medicate.  Even in patients with shellfish allergy, pre-medication for contrast media use is no longer recommended by the most recent guidelines.

## 2015-11-09 NOTE — Telephone Encounter (Signed)
On previous visit patient reported shellfish allergy. When I called to check her allergies she stated that she has never eaten shellfish and does not know if she has an allergy. She has had previous testing done with contrast dye and had no reactions. When I asked why she reported this allergy she stated that it was just because she avoids shellfish due to her father being exposed to agent orange prior to her birth. So she has never had an allergic reaction to shellfish or contrast media. Will forward to Dr. Kirke CorinArida to see if he wants to administer premeds before cath.

## 2015-11-09 NOTE — Telephone Encounter (Signed)
Letter mailed to patient for cardiac cath date, time, location and patient previously notified with no additional questions.

## 2015-11-09 NOTE — Telephone Encounter (Signed)
Yes the aspirin is a blood thinner.

## 2015-11-09 NOTE — Telephone Encounter (Signed)
Discussed upcoming cardiac catheterization with patient. Provided her with date, time, location, and instructions. [facility]:30 AM and her case is scheduled for 11:30 AM. She is to have labs CBC, BMP, PT/INR, and chest xray done before the day of her procedure. Will place these orders and all instructions for procedure up front for her to pick up and will mail her a letter with instructions as well. She verbalized understanding of all instructions and had no further questions at this time. Let her know to please call me back if she has any further questions.

## 2015-11-09 NOTE — Telephone Encounter (Signed)
Spoke to Makayla Green. We are going to wait until after her heart cath on Wed 11/14/15 to have the thyroid biopsy (probably the week after next). Is this ok with you?  Dr. Alvino ChapelIngal put Makayla Green on a medicine that Keilani doesn't know if it's a blood thinner and one of the questions we have to answer for biopsy is the Makayla Green on a blood thinner? Please advise.

## 2015-11-12 ENCOUNTER — Other Ambulatory Visit: Payer: Self-pay | Admitting: Cardiology

## 2015-11-12 ENCOUNTER — Telehealth: Payer: Self-pay | Admitting: Cardiology

## 2015-11-12 ENCOUNTER — Ambulatory Visit
Admission: RE | Admit: 2015-11-12 | Discharge: 2015-11-12 | Disposition: A | Discharge status: Home / Self Care | Payer: 59 | Source: Ambulatory Visit | Attending: Cardiology | Admitting: Cardiology

## 2015-11-12 ENCOUNTER — Other Ambulatory Visit
Admission: RE | Admit: 2015-11-12 | Discharge: 2015-11-12 | Disposition: A | Discharge status: Home / Self Care | Payer: 59 | Source: Ambulatory Visit | Attending: Cardiology | Admitting: Cardiology

## 2015-11-12 ENCOUNTER — Telehealth: Payer: Self-pay | Admitting: *Deleted

## 2015-11-12 DIAGNOSIS — R918 Other nonspecific abnormal finding of lung field: Secondary | ICD-10-CM | POA: Insufficient documentation

## 2015-11-12 DIAGNOSIS — Z01812 Encounter for preprocedural laboratory examination: Secondary | ICD-10-CM

## 2015-11-12 LAB — CBC WITH DIFFERENTIAL/PLATELET
BASOS PCT: 1 %
Basophils Absolute: 0.1 10*3/uL (ref 0–0.1)
EOS ABS: 0.1 10*3/uL (ref 0–0.7)
Eosinophils Relative: 1 %
HCT: 42 % (ref 35.0–47.0)
Hemoglobin: 14.4 g/dL (ref 12.0–16.0)
Lymphocytes Relative: 28 %
Lymphs Abs: 3 10*3/uL (ref 1.0–3.6)
MCH: 32.6 pg (ref 26.0–34.0)
MCHC: 34.3 g/dL (ref 32.0–36.0)
MCV: 95 fL (ref 80.0–100.0)
MONO ABS: 0.9 10*3/uL (ref 0.2–0.9)
MONOS PCT: 9 %
NEUTROS PCT: 61 %
Neutro Abs: 6.5 10*3/uL (ref 1.4–6.5)
Platelets: 314 10*3/uL (ref 150–440)
RBC: 4.42 MIL/uL (ref 3.80–5.20)
RDW: 13.2 % (ref 11.5–14.5)
WBC: 10.7 10*3/uL (ref 3.6–11.0)

## 2015-11-12 LAB — BASIC METABOLIC PANEL
Anion gap: 4 — ABNORMAL LOW (ref 5–15)
BUN: 12 mg/dL (ref 6–20)
CALCIUM: 9.5 mg/dL (ref 8.9–10.3)
CO2: 26 mmol/L (ref 22–32)
CREATININE: 0.61 mg/dL (ref 0.44–1.00)
Chloride: 107 mmol/L (ref 101–111)
Glucose, Bld: 110 mg/dL — ABNORMAL HIGH (ref 65–99)
Potassium: 3.4 mmol/L — ABNORMAL LOW (ref 3.5–5.1)
SODIUM: 137 mmol/L (ref 135–145)

## 2015-11-12 LAB — PROTIME-INR
INR: 1
PROTHROMBIN TIME: 13.4 s (ref 11.4–15.0)

## 2015-11-12 NOTE — Telephone Encounter (Signed)
Patient wants to know which medications to hold for upcoming cath.

## 2015-11-12 NOTE — Telephone Encounter (Signed)
Left detailed voicemail message with instructions for day of procedure. Left instructions for patient to call if she has any further questions.

## 2015-11-12 NOTE — Telephone Encounter (Signed)
error 

## 2015-11-12 NOTE — Telephone Encounter (Signed)
Spoke with patient regarding her upcoming catheterization, location, time, and instructions. She verbalized understanding of all information and had no further questions at this time.

## 2015-11-12 NOTE — Telephone Encounter (Signed)
Spoke with patient to see if she is coming to get her pre-labs done and xray. She verbalized understanding and stated that she was on her way now to get all of that completed. She had no other questions at this time.

## 2015-11-14 ENCOUNTER — Encounter (HOSPITAL_COMMUNITY)
Admission: RE | Disposition: A | Discharge status: Home / Self Care | Payer: Self-pay | Source: Ambulatory Visit | Attending: Cardiovascular Disease

## 2015-11-14 ENCOUNTER — Encounter (HOSPITAL_COMMUNITY): Payer: Self-pay | Admitting: Cardiovascular Disease

## 2015-11-14 ENCOUNTER — Ambulatory Visit (HOSPITAL_COMMUNITY)
Admission: RE | Admit: 2015-11-14 | Discharge: 2015-11-14 | Disposition: A | Discharge status: Home / Self Care | Payer: 59 | Source: Ambulatory Visit | Attending: Cardiovascular Disease | Admitting: Cardiovascular Disease

## 2015-11-14 DIAGNOSIS — I2510 Atherosclerotic heart disease of native coronary artery without angina pectoris: Secondary | ICD-10-CM | POA: Insufficient documentation

## 2015-11-14 DIAGNOSIS — Z880 Allergy status to penicillin: Secondary | ICD-10-CM | POA: Insufficient documentation

## 2015-11-14 DIAGNOSIS — I25118 Atherosclerotic heart disease of native coronary artery with other forms of angina pectoris: Secondary | ICD-10-CM | POA: Diagnosis not present

## 2015-11-14 DIAGNOSIS — Z8249 Family history of ischemic heart disease and other diseases of the circulatory system: Secondary | ICD-10-CM | POA: Diagnosis not present

## 2015-11-14 DIAGNOSIS — F17200 Nicotine dependence, unspecified, uncomplicated: Secondary | ICD-10-CM | POA: Diagnosis not present

## 2015-11-14 DIAGNOSIS — I2584 Coronary atherosclerosis due to calcified coronary lesion: Secondary | ICD-10-CM | POA: Insufficient documentation

## 2015-11-14 HISTORY — PX: CARDIAC CATHETERIZATION: SHX172

## 2015-11-14 LAB — HCG, SERUM, QUALITATIVE: Preg, Serum: NEGATIVE

## 2015-11-14 SURGERY — LEFT HEART CATH AND CORONARY ANGIOGRAPHY

## 2015-11-14 MED ORDER — SODIUM CHLORIDE 0.9 % IV SOLN: 250.0000 mL | INTRAVENOUS | Status: DC | PRN

## 2015-11-14 MED ORDER — HEPARIN SODIUM (PORCINE) 1000 UNIT/ML IJ SOLN
INTRAMUSCULAR | Status: AC
  Filled 2015-11-14: qty 1

## 2015-11-14 MED ORDER — FENTANYL CITRATE (PF) 100 MCG/2ML IJ SOLN
INTRAMUSCULAR | Status: DC | PRN
  Administered 2015-11-14: 25 ug via INTRAVENOUS

## 2015-11-14 MED ORDER — HEPARIN SODIUM (PORCINE) 1000 UNIT/ML IJ SOLN
INTRAMUSCULAR | Status: DC | PRN
  Administered 2015-11-14: 3000 [IU] via INTRAVENOUS

## 2015-11-14 MED ORDER — LIDOCAINE HCL (PF) 1 % IJ SOLN
INTRAMUSCULAR | Status: AC
  Filled 2015-11-14: qty 30

## 2015-11-14 MED ORDER — IOPAMIDOL (ISOVUE-370) INJECTION 76%
INTRAVENOUS | Status: AC
  Filled 2015-11-14: qty 100

## 2015-11-14 MED ORDER — SODIUM CHLORIDE 0.9% FLUSH: 3.0000 mL | INTRAVENOUS | Status: DC | PRN

## 2015-11-14 MED ORDER — SODIUM CHLORIDE 0.9 % WEIGHT BASED INFUSION: 1.0000 mL/kg/h | INTRAVENOUS | Status: DC

## 2015-11-14 MED ORDER — FENTANYL CITRATE (PF) 100 MCG/2ML IJ SOLN
INTRAMUSCULAR | Status: AC
  Filled 2015-11-14: qty 2

## 2015-11-14 MED ORDER — SODIUM CHLORIDE 0.9 % IV SOLN
INTRAVENOUS | Status: DC
  Administered 2015-11-14: 11:00:00 via INTRAVENOUS

## 2015-11-14 MED ORDER — VERAPAMIL HCL 2.5 MG/ML IV SOLN
INTRAVENOUS | Status: DC | PRN
  Administered 2015-11-14: 10 mL via INTRA_ARTERIAL

## 2015-11-14 MED ORDER — MIDAZOLAM HCL 2 MG/2ML IJ SOLN
INTRAMUSCULAR | Status: AC
Start: 2015-11-14 — End: 2015-11-14
  Filled 2015-11-14: qty 2

## 2015-11-14 MED ORDER — LIDOCAINE HCL (PF) 1 % IJ SOLN
INTRAMUSCULAR | Status: DC | PRN
  Administered 2015-11-14: 3 mL

## 2015-11-14 MED ORDER — VERAPAMIL HCL 2.5 MG/ML IV SOLN
INTRAVENOUS | Status: AC
  Filled 2015-11-14: qty 2

## 2015-11-14 MED ORDER — MIDAZOLAM HCL 2 MG/2ML IJ SOLN
INTRAMUSCULAR | Status: DC | PRN
  Administered 2015-11-14: 1 mg via INTRAVENOUS

## 2015-11-14 MED ORDER — SODIUM CHLORIDE 0.9% FLUSH: 3.0000 mL | Freq: Two times a day (BID) | INTRAVENOUS | Status: DC

## 2015-11-14 MED ORDER — HEPARIN (PORCINE) IN NACL 2-0.9 UNIT/ML-% IJ SOLN
INTRAMUSCULAR | Status: AC
  Filled 2015-11-14: qty 1000

## 2015-11-14 MED ORDER — IOPAMIDOL (ISOVUE-370) INJECTION 76%
INTRAVENOUS | Status: DC | PRN
Start: 2015-11-14 — End: 2015-11-14
  Administered 2015-11-14: 60 mL via INTRA_ARTERIAL

## 2015-11-14 SURGICAL SUPPLY — 10 items
CATH INFINITI 5FR ANG PIGTAIL (CATHETERS) ×2 IMPLANT
CATH OPTITORQUE JACKY 4.0 5F (CATHETERS) ×2 IMPLANT
DEVICE RAD COMP TR BAND LRG (VASCULAR PRODUCTS) ×2 IMPLANT
GLIDESHEATH SLEND SS 6F .021 (SHEATH) ×2 IMPLANT
KIT HEART LEFT (KITS) ×3 IMPLANT
PACK CARDIAC CATHETERIZATION (CUSTOM PROCEDURE TRAY) ×3 IMPLANT
SYR MEDRAD MARK V 150ML (SYRINGE) ×3 IMPLANT
TRANSDUCER W/STOPCOCK (MISCELLANEOUS) ×3 IMPLANT
TUBING CIL FLEX 10 FLL-RA (TUBING) ×3 IMPLANT
WIRE SAFE-T 1.5MM-J .035X260CM (WIRE) ×2 IMPLANT

## 2015-11-14 NOTE — H&P (View-Only) (Signed)
  Cardiology Office Note   Date:  11/08/2015   ID:  Makayla Green, DOB 10/29/1970, MRN [id number]  Referring Doctor:  Talia Aron, MD   Cardiologist:   Lubna Stegeman, MD   Reason for consultation:  Chief Complaint  Patient presents with  . other    CAD c/o back shoulder blade burning sensation radiates to left arm. Meds reviewed verbally with pt.      History of Present Illness: Makayla Green is a 44 y.o. female who presents for CAD, back pain the radiating to the left arm.  Patient reports this sharp stabbing back pain that is radiating down to the left arm. This going on for over a year now. On and off throughout that time. Occurs minutes at a time, resolved spontaneously. Sometimes exertional in nature. Associated with shortness of breath. Moderate in intensity.  In 2015 she was being treated for bronchitis. Patient recalls the CT scan was done and was told that they found evidence of heart disease on that CT scan. She had another CT scan recently that shows persistence of this finding and wants cardiology input.  No loss of consciousness, cough, colds, fever, abdominal pain, PND, orthopnea, edema.  ROS:  Please see the history of present illness. Aside from mentioned under HPI, all other systems are reviewed and negative.     Past Medical History  Diagnosis Date  . Other general counseling and advice for contraceptive management   . Routine general medical examination at a health care facility   . Screening for ischemic heart disease   . Tobacco use disorder   . Abnormal pap     LEEP 2005  . Coronary artery disease   . Asthma     History reviewed. No pertinent past surgical history.   reports that she has been smoking Cigarettes.  She has a 25 pack-year smoking history. She has never used smokeless tobacco. She reports that she does not drink alcohol or use illicit drugs.   family history includes COPD in her mother; Heart disease in her mother.    Current Outpatient Prescriptions  Medication Sig Dispense Refill  . budesonide-formoterol (SYMBICORT) 160-4.5 MCG/ACT inhaler Inhale 2 puffs into the lungs daily. (Patient taking differently: Inhale 2 puffs into the lungs as needed. ) 1 Inhaler 12  . fexofenadine-pseudoephedrine (ALLEGRA-D 24) 180-240 MG per 24 hr tablet Take 1 tablet by mouth daily. (Patient taking differently: Take 1 tablet by mouth as needed. ) 30 tablet 3  . fluticasone (FLONASE) 50 MCG/ACT nasal spray Place 2 sprays into both nostrils as needed.     . ibuprofen (ADVIL,MOTRIN) 200 MG tablet Take 800 mg by mouth every 4 (four) hours as needed.    . medroxyPROGESTERone (DEPO-PROVERA) 150 MG/ML injection Inject 150 mg into the muscle every 3 (three) months.    . rosuvastatin (CRESTOR) 5 MG tablet Take 1 tablet (5 mg total) by mouth daily. 90 tablet 3  . aspirin EC 81 MG tablet Take 1 tablet (81 mg total) by mouth daily. 90 tablet 3  . isosorbide mononitrate (IMDUR) 30 MG 24 hr tablet Take 1 tablet (30 mg total) by mouth daily. Take 1/2 tablet Once daily 30 tablet 11   No current facility-administered medications for this visit.    Allergies: Penicillins; Amitriptyline; Doxycycline; and Shellfish allergy    PHYSICAL EXAM: VS:  BP 100/64 mmHg  Pulse 82  Ht 5' 9" (1.753 m)  Wt 142 lb 4 oz (64.524 kg)  BMI 21.00 kg/m2 ,   Body mass index is 21 kg/(m^2). Wt Readings from Last 3 Encounters:  11/08/15 142 lb 4 oz (64.524 kg)  10/23/15 137 lb (62.143 kg)  09/17/15 139 lb 8 oz (63.277 kg)    GENERAL:  well developed, well nourished,obese, not in acute distress HEENT: normocephalic, pink conjunctivae, anicteric sclerae, no xanthelasma, normal dentition, oropharynx clear NECK:  no neck vein engorgement, JVP normal, no hepatojugular reflux, carotid upstroke brisk and symmetric, no bruit, no thyromegaly, no lymphadenopathy LUNGS:  good respiratory effort, clear to auscultation bilaterally CV:  PMI not displaced, no thrills, no  lifts, S1 and S2 within normal limits, no palpable S3 or S4, no murmurs, no rubs, no gallops ABD:  Soft, nontender, nondistended, normoactive bowel sounds, no abdominal aortic bruit, no hepatomegaly, no splenomegaly MS: nontender back, no kyphosis, no scoliosis, no joint deformities EXT:  2+ DP/PT pulses, no edema, no varicosities, no cyanosis, no clubbing SKIN: warm, nondiaphoretic, normal turgor, no ulcers NEUROPSYCH: alert, oriented to person, place, and time, sensory/motor grossly intact, normal mood, appropriate affect  Recent Labs: 10/23/2015: ALT 14; BUN 11; Creatinine, Ser 0.69; Potassium 4.0; Sodium 139   Lipid Panel    Component Value Date/Time   CHOL 197 10/23/2015 1333   TRIG 116.0 10/23/2015 1333   HDL 62.00 10/23/2015 1333   CHOLHDL 3 10/23/2015 1333   VLDL 23.2 10/23/2015 1333   LDLCALC 112* 10/23/2015 1333     Other studies Reviewed:  EKG:  The ekg from04/01/2016 was personally reviewed by me and it revealed sinus rhythm 82 BPM  Additional studies/ records that were reviewed personally reviewed by me today include: None available  ASSESSMENT AND PLAN:  Back pain radiating to left arm/chest pain  Possible CAD Findings of Proximal LAD calcified coronary atherosclerosis from CT chest 06/07/2014. This was also noted on CT chest from 10/30/2015.  Agree with aspirin 81 mg by mouth daily. Agree with Crestor for now. LDL goal is LDL less than 70. Recommend Imdur 30mg half a tablet daily for now. Monitor for side effects and blood pressure.   Because of the presence of calcium or coronary atherosclerosis noted in the proximal LAD, recommend proceed with further investigation with left heart catheterization. Risks and benefits of cardiac catheterization have been discussed with the patient.  These include less than 1% chance of major complications including but not limited to bleeding, infection, kidney damage, arrhyhthmia, heart attack, stroke, and death.  The patient  understands these risks and is willing to proceed.  Tobacco use Smoking cessation recommended  Current medicines are reviewed at length with the patient today.  The patient does not have concerns regarding medicines.  Labs/ tests ordered today include:  Orders Placed This Encounter  Procedures  . EKG 12-Lead    I had a lengthy and detailed discussion with the patient regarding diagnoses, prognosis, diagnostic options, treatment options , and side effects of medications.   I counseled the patient on importance of lifestyle modification including heart healthy diet, regular physical activity  , and smoking cessation.   Disposition:   FU with undersigned after tests   Signed, Octavious Zidek, MD  11/08/2015 1:07 PM    [facility]      

## 2015-11-14 NOTE — Research (Signed)
[facility] RESEARCH STUDY Informed Consent   Subject Name: Makayla Green  Subject met inclusion and exclusion criteria.  The informed consent form, study requirements and expectations were reviewed with the subject and questions and concerns were addressed prior to the signing of the consent form.  The subject verbalized understanding of the trial requirements.  The subject agreed to participate in the [place] trial and signed the informed consent.  The informed consent was obtained prior to performance of any protocol-specific procedures for the subject.  A copy of the signed informed consent was given to the subject and a copy was placed in the subject's medical record.  Marlana Salvage 11/14/2015, 09:45

## 2015-11-14 NOTE — Interval H&P Note (Signed)
History and Physical Interval Note:  11/14/2015 11:50 AM  Makayla Green  has presented today for surgery, with the diagnosis of cad  The various methods of treatment have been discussed with the patient and family. After consideration of risks, benefits and other options for treatment, the patient has consented to  Procedure(s): Left Heart Cath and Coronary Angiography (N/A) as a surgical intervention .  The patient's history has been reviewed, patient examined, no change in status, stable for surgery.  I have reviewed the patient's chart and labs.  Questions were answered to the patient's satisfaction.     Lorine BearsMuhammad Arida

## 2015-11-14 NOTE — Discharge Instructions (Signed)
Radial Site Care °Refer to this sheet in the next few weeks. These instructions provide you with information about caring for yourself after your procedure. Your health care provider may also give you more specific instructions. Your treatment has been planned according to current medical practices, but problems sometimes occur. Call your health care provider if you have any problems or questions after your procedure. °WHAT TO EXPECT AFTER THE PROCEDURE °After your procedure, it is typical to have the following: °· Bruising at the radial site that usually fades within 1-2 weeks. °· Blood collecting in the tissue (hematoma) that may be painful to the touch. It should usually decrease in size and tenderness within 1-2 weeks. °HOME CARE INSTRUCTIONS °· Take medicines only as directed by your health care provider. °· You may shower 24-48 hours after the procedure or as directed by your health care provider. Remove the bandage (dressing) and gently wash the site with plain soap and water. Pat the area dry with a clean towel. Do not rub the site, because this may cause bleeding. °· Do not take baths, swim, or use a hot tub until your health care provider approves. °· Check your insertion site every day for redness, swelling, or drainage. °· Do not apply powder or lotion to the site. °· Do not flex or bend the affected arm for 24 hours or as directed by your health care provider. °· Do not push or pull heavy objects with the affected arm for 24 hours or as directed by your health care provider. °· Do not lift over 10 lb (4.5 kg) for 5 days after your procedure or as directed by your health care provider. °· Ask your health care provider when it is okay to: °¨ Return to work or school. °¨ Resume usual physical activities or sports. °¨ Resume sexual activity. °· Do not drive home if you are discharged the same day as the procedure. Have someone else drive you. °· You may drive 24 hours after the procedure unless otherwise  instructed by your health care provider. °· Do not operate machinery or power tools for 24 hours after the procedure. °· If your procedure was done as an outpatient procedure, which means that you went home the same day as your procedure, a responsible adult should be with you for the first 24 hours after you arrive home. °· Keep all follow-up visits as directed by your health care provider. This is important. °SEEK MEDICAL CARE IF: °· You have a fever. °· You have chills. °· You have increased bleeding from the radial site. Hold pressure on the site. °SEEK IMMEDIATE MEDICAL CARE IF: °· You have unusual pain at the radial site. °· You have redness, warmth, or swelling at the radial site. °· You have drainage (other than a small amount of blood on the dressing) from the radial site. °· The radial site is bleeding, and the bleeding does not stop after 30 minutes of holding steady pressure on the site. °· Your arm or hand becomes pale, cool, tingly, or numb. °  °This information is not intended to replace advice given to you by your health care provider. Make sure you discuss any questions you have with your health care provider. °  °Document Released: 08/23/2010 Document Revised: 08/11/2014 Document Reviewed: 02/06/2014 °Elsevier Interactive Patient Education ©2016 [employer] ° °

## 2015-11-15 MED FILL — Heparin Sodium (Porcine) Inj 1000 Unit/ML: INTRAMUSCULAR | Qty: 10 | Status: AC

## 2015-11-15 MED FILL — Heparin Sodium (Porcine) 2 Unit/ML in Sodium Chloride 0.9%: INTRAMUSCULAR | Qty: 500 | Status: AC

## 2015-11-19 ENCOUNTER — Telehealth: Payer: Self-pay

## 2015-11-19 NOTE — Telephone Encounter (Signed)
-----   Message from Bryna ColanderPamela S Allen, RN sent at 11/19/2015  9:34 AM EDT ----- Regarding: follow up appointment Patient had a cardiac cath on 4/12 and needs follow up appointment with Dr. Alvino ChapelIngal.   Thanks so much Rinaldo CloudPamela

## 2015-11-19 NOTE — Telephone Encounter (Signed)
L mom to schedule f/u appt with Dr. Alvino ChapelIngal. See note below.

## 2015-11-21 ENCOUNTER — Ambulatory Visit (INDEPENDENT_AMBULATORY_CARE_PROVIDER_SITE_OTHER): Payer: 59 | Admitting: Cardiology

## 2015-11-21 ENCOUNTER — Encounter: Payer: Self-pay | Admitting: Cardiology

## 2015-11-21 VITALS — BP 100/70 | HR 87 | Ht 69.0 in | Wt 140.0 lb

## 2015-11-21 DIAGNOSIS — F17200 Nicotine dependence, unspecified, uncomplicated: Secondary | ICD-10-CM | POA: Diagnosis not present

## 2015-11-21 DIAGNOSIS — I2510 Atherosclerotic heart disease of native coronary artery without angina pectoris: Secondary | ICD-10-CM

## 2015-11-21 NOTE — Progress Notes (Signed)
Cardiology Office Note   Date:  11/21/2015   ID:  Makayla Green, DOB 04/30/1971, MRN [id number]  Referring Doctor:  Ruthe Mannanalia Aron, MD   Cardiologist:   Almond LintAileen Ralphine Hinks, MD   Reason for consultation:  Chief Complaint  Patient presents with  . other    Follow up from the Cardiac cath. Meds reviewed by the patient verbally.       History of Present Illness: Makayla Green is a 45 y.o. female who presents for Follow-up after heart cath.   Patient reports that her chest pain has improved with imdur. She is on a very small dose because her blood pressure has been borderline. Lowest blood pressure has been in the 90s systolic. Patient did well after the left heart catheterization, no complications noted.  Patient denies shortness of breath, loss of consciousness, cough, colds, fever, abdominal pain, PND, orthopnea, edema. No bleeding issues.  ROS:  Please see the history of present illness. Aside from mentioned under HPI, all other systems are reviewed and negative.    Past Medical History  Diagnosis Date  . Other general counseling and advice for contraceptive management   . Routine general medical examination at a health care facility   . Screening for ischemic heart disease   . Tobacco use disorder   . Abnormal pap     LEEP 2005  . Coronary artery disease   . Asthma     Past Surgical History  Procedure Laterality Date  . Cardiac catheterization N/A 11/14/2015    Procedure: Left Heart Cath and Coronary Angiography;  Surgeon: Iran OuchMuhammad A Arida, MD;  Location: [facility];  Service: Cardiovascular;  Laterality: N/A;     reports that she has been smoking Cigarettes.  She has a 25 pack-year smoking history. She has never used smokeless tobacco. She reports that she does not drink alcohol or use illicit drugs.   family history includes COPD in her mother; Heart disease in her mother.   Current Outpatient Prescriptions  Medication Sig Dispense Refill  .  aspirin EC 81 MG tablet Take 1 tablet (81 mg total) by mouth daily. 90 tablet 3  . budesonide-formoterol (SYMBICORT) 160-4.5 MCG/ACT inhaler Inhale 2 puffs into the lungs daily. (Patient taking differently: Inhale 2 puffs into the lungs daily as needed (shortness of breath). ) 1 Inhaler 12  . fexofenadine-pseudoephedrine (ALLEGRA-D 24) 180-240 MG per 24 hr tablet Take 1 tablet by mouth daily. (Patient taking differently: Take 1 tablet by mouth daily as needed (shortness of breath). ) 30 tablet 3  . fluticasone (FLONASE) 50 MCG/ACT nasal spray Place 2 sprays into both nostrils as needed for allergies.     . Fluticasone-Salmeterol (ADVAIR) 250-50 MCG/DOSE AEPB Inhale 1 puff into the lungs 2 (two) times daily.    Marland Kitchen. ibuprofen (ADVIL,MOTRIN) 200 MG tablet Take 800 mg by mouth every 4 (four) hours as needed.    . isosorbide mononitrate (IMDUR) 30 MG 24 hr tablet Take 1 tablet (30 mg total) by mouth daily. Take 1/2 tablet Once daily (Patient taking differently: Take 15 mg by mouth daily. ) 30 tablet 11  . medroxyPROGESTERone (DEPO-PROVERA) 150 MG/ML injection Inject 150 mg into the muscle every 3 (three) months.    . rosuvastatin (CRESTOR) 5 MG tablet Take 1 tablet (5 mg total) by mouth daily. 90 tablet 3   No current facility-administered medications for this visit.    Allergies: Penicillins; Amitriptyline; Doxycycline; and Shellfish allergy    PHYSICAL EXAM: VS:  BP  100/70 mmHg  Pulse 87  Ht  (1.753 m)  Wt 140 lb (63.504 kg)  BMI 20.67 kg/m2  SpO2 98% , Body mass index is 20.67 kg/(m^2). Wt Readings from Last 3 Encounters:  11/21/15 140 lb (63.504 kg)  11/14/15 142 lb (64.411 kg)  11/08/15 142 lb 4 oz (64.524 kg)    GENERAL:  well developed, well nourished,obese, not in acute distress HEENT: normocephalic, pink conjunctivae, anicteric sclerae, no xanthelasma, normal dentition, oropharynx clear NECK:  no neck vein engorgement, JVP normal, no hepatojugular reflux, carotid upstroke brisk  and symmetric, no bruit, no thyromegaly, no lymphadenopathy LUNGS:  good respiratory effort, clear to auscultation bilaterally CV:  PMI not displaced, no thrills, no lifts, S1 and S2 within normal limits, no palpable S3 or S4, no murmurs, no rubs, no gallops ABD:  Soft, nontender, nondistended, normoactive bowel sounds, no abdominal aortic bruit, no hepatomegaly, no splenomegaly MS: nontender back, no kyphosis, no scoliosis, no joint deformities EXT:  2+ DP/PT pulses, no edema, no varicosities, no cyanosis, no clubbing. Right radial pulse 2+, no bruit. SKIN: warm, nondiaphoretic, normal turgor, no ulcers NEUROPSYCH: alert, oriented to person, place, and time, sensory/motor grossly intact, normal mood, appropriate affect  Recent Labs: 10/23/2015: ALT 14 11/12/2015: BUN 12; Creatinine, Ser 0.61; Hemoglobin 14.4; Platelets 314; Potassium 3.4*; Sodium 137   Lipid Panel    Component Value Date/Time   CHOL 197 10/23/2015 1333   TRIG 116.0 10/23/2015 1333   HDL 62.00 10/23/2015 1333   CHOLHDL 3 10/23/2015 1333   VLDL 23.2 10/23/2015 1333   LDLCALC 112* 10/23/2015 1333     Other studies Reviewed:  EKG:  The ekg from04/01/2016 was personally reviewed by me and it revealed sinus rhythm 82 BPM  Additional studies/ records that were reviewed personally reviewed by me today include:  LHC 11/14/2015:  Ost LAD to Prox LAD lesion, 10% stenosed.  The left ventricular systolic function is normal.  1. Mildly calcified ostial LAD with no evidence of obstructive disease. 2. Normal LV systolic function.  ASSESSMENT AND PLAN:  CAD, mild, recommend medical therapy Findings of Proximal LAD calcified coronary atherosclerosis from CT chest 06/07/2014. This was also noted on CT chest from 10/30/2015. LHC 11/14/2015 showed mildly calcified ostial LAD, 10% stenosis. Findings a left heart catheterization was discussed with patient. Recommend to continue aspirin 81 mg by mouth daily. Agree with Crestor  for now. LDL goal is LDL less than 70. Continue Imdur  half a tablet daily for now. Recommend follow-up in 6 weeks to determine if we can put patient on low-dose beta blocker. Patient to continue to monitor her blood pressure. Recommend lifestyle changes, risk factor modification.  Tobacco use Smoking cessation recommended. Patient continues to smoke one pack per day.   Current medicines are reviewed at length with the patient today.  The patient does not have concerns regarding medicines.  Labs/ tests ordered today include:  No orders of the defined types were placed in this encounter.    I had a lengthy and detailed discussion with the patient regarding diagnoses, prognosis, diagnostic options, treatment options , and side effects of medications.   I counseled the patient on importance of lifestyle modification including heart healthy diet, regular physical activity, and smoking cessation.   Disposition:   FU with undersigned in 6 weeks  Signed, Almond Lint, MD  11/21/2015 10:36 AM    [facility]

## 2015-11-21 NOTE — Patient Instructions (Signed)
Medication Instructions:  Your physician recommends that you continue on your current medications as directed. Please refer to the Current Medication list given to you today.   Labwork: None ordered  Testing/Procedures: None ordered  Follow-Up: Your physician recommends that you schedule a follow-up appointment in: 6 weeks with Dr. Alvino ChapelIngal  Date & Time: _____________________________________________________________   Any Other Special Instructions Will Be Listed Below (If Applicable).     If you need a refill on your cardiac medications before your next appointment, please call your pharmacy.

## 2015-11-22 ENCOUNTER — Encounter: Payer: Self-pay | Admitting: Family Medicine

## 2015-11-22 ENCOUNTER — Other Ambulatory Visit (HOSPITAL_COMMUNITY)
Admission: RE | Admit: 2015-11-22 | Discharge: 2015-11-22 | Disposition: A | Discharge status: Home / Self Care | Payer: 59 | Source: Ambulatory Visit | Attending: Radiology | Admitting: Radiology

## 2015-11-22 ENCOUNTER — Ambulatory Visit
Admission: RE | Admit: 2015-11-22 | Discharge: 2015-11-22 | Disposition: A | Discharge status: Home / Self Care | Payer: 59 | Source: Ambulatory Visit | Attending: Family Medicine | Admitting: Family Medicine

## 2015-11-22 DIAGNOSIS — E079 Disorder of thyroid, unspecified: Secondary | ICD-10-CM | POA: Diagnosis not present

## 2015-11-26 ENCOUNTER — Encounter: Payer: Self-pay | Admitting: Family Medicine

## 2015-11-26 ENCOUNTER — Other Ambulatory Visit: Payer: Self-pay | Admitting: Family Medicine

## 2015-11-26 DIAGNOSIS — E041 Nontoxic single thyroid nodule: Secondary | ICD-10-CM

## 2015-11-27 ENCOUNTER — Other Ambulatory Visit (INDEPENDENT_AMBULATORY_CARE_PROVIDER_SITE_OTHER): Payer: 59

## 2015-11-27 ENCOUNTER — Encounter: Payer: Self-pay | Admitting: Family Medicine

## 2015-11-27 DIAGNOSIS — E041 Nontoxic single thyroid nodule: Secondary | ICD-10-CM

## 2015-11-27 LAB — T3, FREE: T3, Free: 3.9 pg/mL (ref 2.3–4.2)

## 2015-11-27 LAB — T4, FREE: Free T4: 1.15 ng/dL (ref 0.60–1.60)

## 2015-11-27 LAB — TSH: TSH: 0.51 u[IU]/mL (ref 0.35–4.50)

## 2015-11-28 ENCOUNTER — Other Ambulatory Visit: Payer: Self-pay | Admitting: Family Medicine

## 2015-11-28 DIAGNOSIS — E041 Nontoxic single thyroid nodule: Secondary | ICD-10-CM

## 2015-12-03 ENCOUNTER — Other Ambulatory Visit: Payer: 59

## 2015-12-13 ENCOUNTER — Encounter: Payer: Self-pay | Admitting: Cardiovascular Disease

## 2015-12-13 ENCOUNTER — Ambulatory Visit (INDEPENDENT_AMBULATORY_CARE_PROVIDER_SITE_OTHER): Payer: 59 | Admitting: Cardiovascular Disease

## 2015-12-13 VITALS — BP 115/75 | HR 90 | Ht 67.0 in | Wt 138.2 lb

## 2015-12-13 DIAGNOSIS — I2510 Atherosclerotic heart disease of native coronary artery without angina pectoris: Secondary | ICD-10-CM | POA: Diagnosis not present

## 2015-12-13 DIAGNOSIS — F17200 Nicotine dependence, unspecified, uncomplicated: Secondary | ICD-10-CM

## 2015-12-13 NOTE — Progress Notes (Signed)
Patient ID: Makayla Green, female   DOB: February 03, 1971, 45 y.o.   MRN: [id number]    Cardiology Office Note    Date:  12/13/2015   ID:  Makayla Green, DOB May 14, 1971, MRN [id number]  PCP:  Ruthe Mannan, MD  Cardiologist:   Thurmon Fair, MD   Chief Complaint  Patient presents with  . New Evaluation    followup from cath, no chest pain    History of Present Illness:  Makayla Green is a 45 y.o. female with atypical chest discomfort and incidentally discovered coronary calcification on CT of the chest, leading to cardiac catheterization performed April 12 by Dr. Kirke Corin, which showed minor (10%) plaque in the proximal LAD artery, corresponding to the calcium deposits seen on CT of the chest. Treatment with isosorbide has led to the resolution of her chest discomfort which is actually a left scapular burning sensation. However the isosorbide led to dizziness and fatigue, improved when she started to take it at night. Dr. Alvino Chapel had recommended starting a beta blocker, but Makayla Green was reluctant due to her already low blood pressure.  She is a long-standing smoker (25 pack years). She used to smoke as much as 2 packs a day, recently cutting back now down to roughly half a pack a day. She is allergic to the adhesive on nicotine patches Her lipid profile showed a mildly elevated LDL at 112, with an excellent HDL in excess of 60. She does not have diabetes mellitus or hypertension. Her mother has poorly defined "heart disease".   Her entire family smokes. Her father, Rachael Darby has also seen as a patient.   Past Medical History  Diagnosis Date  . Other general counseling and advice for contraceptive management   . Routine general medical examination at a health care facility   . Screening for ischemic heart disease   . Tobacco use disorder   . Abnormal pap     LEEP 2005  . Coronary artery disease   . Asthma     Past Surgical History  Procedure Laterality Date  . Cardiac  catheterization N/A 11/14/2015    Procedure: Left Heart Cath and Coronary Angiography;  Surgeon: Iran Ouch, MD;  Location: [facility];  Service: Cardiovascular;  Laterality: N/A;    Current Medications: Outpatient Prescriptions Prior to Visit  Medication Sig Dispense Refill  . aspirin EC 81 MG tablet Take 1 tablet (81 mg total) by mouth daily. 90 tablet 3  . budesonide-formoterol (SYMBICORT) 160-4.5 MCG/ACT inhaler Inhale 2 puffs into the lungs daily. (Patient taking differently: Inhale 2 puffs into the lungs daily as needed (shortness of breath). ) 1 Inhaler 12  . fexofenadine-pseudoephedrine (ALLEGRA-D 24) 180-240 MG per 24 hr tablet Take 1 tablet by mouth daily. (Patient taking differently: Take 1 tablet by mouth daily as needed (shortness of breath). ) 30 tablet 3  . fluticasone (FLONASE) 50 MCG/ACT nasal spray Place 2 sprays into both nostrils as needed for allergies.     . Fluticasone-Salmeterol (ADVAIR) 250-50 MCG/DOSE AEPB Inhale 1 puff into the lungs 2 (two) times daily.    . isosorbide mononitrate (IMDUR) 30 MG 24 hr tablet Take 1 tablet (30 mg total) by mouth daily. Take 1/2 tablet Once daily (Patient taking differently: Take 15 mg by mouth daily. ) 30 tablet 11  . medroxyPROGESTERone (DEPO-PROVERA) 150 MG/ML injection Inject 150 mg into the muscle every 3 (three) months.    . rosuvastatin (CRESTOR) 5 MG tablet Take 1 tablet (5 mg total)  by mouth daily. 90 tablet 3  . ibuprofen (ADVIL,MOTRIN) 200 MG tablet Take 800 mg by mouth every 4 (four) hours as needed. Reported on 12/13/2015     No facility-administered medications prior to visit.     Allergies:   Penicillins; Amitriptyline; Doxycycline; and Shellfish allergy   Social History   Social History  . Marital Status: Married    Spouse Name: N/A  . Number of Children: 3  . Years of Education: N/A   Social History Main Topics  . Smoking status: Current Every Day Smoker -- 1.00 packs/day for 25 years    Types:  Cigarettes  . Smokeless tobacco: Never Used  . Alcohol Use: No  . Drug Use: No  . Sexual Activity: Not Asked   Other Topics Concern  . None   Social History Narrative     Family History:  The patient's family history includes COPD in her mother; Heart disease in her mother.   ROS:   Please see the history of present illness.    ROS All other systems reviewed and are negative.   PHYSICAL EXAM:   VS:  BP 88/70 mmHg  Pulse 90  Ht 5\' 7"  (1.702 m)  Wt 62.71 kg (138 lb 4 oz)  BMI 21.65 kg/m2  SpO2 98%   GEN: Well nourished, well developed, in no acute distress HEENT: normal Neck: no JVD, carotid bruits, or masses Cardiac: RRR; no murmurs, rubs, or gallops,no edema  Respiratory:  clear to auscultation bilaterally, normal work of breathing GI: soft, nontender, nondistended, + BS MS: no deformity or atrophy Skin: warm and dry, no rash Neuro:  Alert and Oriented x 3, Strength and sensation are intact Psych: euthymic mood, full affect  Wt Readings from Last 3 Encounters:  12/13/15 62.71 kg (138 lb 4 oz)  11/21/15 63.504 kg (140 lb)  11/14/15 64.411 kg (142 lb)      Studies/Labs Reviewed:   EKG:  EKG is not ordered today.   Recent Labs: 10/23/2015: ALT 14 11/12/2015: BUN 12; Creatinine, Ser 0.61; Hemoglobin 14.4; Platelets 314; Potassium 3.4*; Sodium 137 11/27/2015: TSH 0.51   Lipid Panel    Component Value Date/Time   CHOL 197 10/23/2015 1333   TRIG 116.0 10/23/2015 1333   HDL 62.00 10/23/2015 1333   CHOLHDL 3 10/23/2015 1333   VLDL 23.2 10/23/2015 1333   LDLCALC 112* 10/23/2015 1333    Additional studies/ records that were reviewed today include:  Coronary angiography    ASSESSMENT:    1. Coronary artery disease involving native coronary artery of native heart without angina pectoris   2. TOBACCO ABUSE      PLAN:  In order of problems listed above:  1. CAD: Makayla Green does not have any fixed coronary blockages. I doubt that she'll benefit from beta  blockers for symptom relief, and these do not appear to be indicated otherwise. Her angina has resolved with long-acting nitrates. I suspect that it is more likely that she has in the file dysfunction and coronary vasospasm than conventional obstructive coronary insufficiency. We discussed the concept of vasospasm/and the fetal dysfunction and the fact that cigarette smoking is by far the biggest contributor to this disorder. Her lipid profile is not bad. She is now on a low dose of statin. Target LDL less than 100. Recheck lipids, already planned for end of June with her primary care provider. Her HDL is pretty good. She is taking a non-estrogen-containing contraceptive. She should avoid estrogen supplements, especially as long as she smokes.  2. She is very interested in smoking cessation. She tried using nicotine supplements, but the patches that she wore caused an allergic reaction and the nicotine gum makes her nauseous. I recommended that she try using a different nicotine patch brand. She might also try cutting off the peripheral adhesive part of the patch and applying it on the back of a Band-Aid, which she tolerates without allergic reactions.    Medication Adjustments/Labs and Tests Ordered: Current medicines are reviewed at length with the patient today.  Concerns regarding medicines are outlined above.  Medication changes, Labs and Tests ordered today are listed in the Patient Instructions below. There are no Patient Instructions on file for this visit.   Joie BimlerSigned, Fleurette Woolbright, MD  12/13/2015 9:11 AM    [facility] [address], [place], [place]  [address] Phone: [phone]; Fax: [phone]

## 2015-12-13 NOTE — Patient Instructions (Signed)
Congratulations on quitting smoking!!  Your physician recommends that you continue on your current medications as directed. Please refer to the Current Medication list given to you today.  Dr Royann Shiversroitoru recommends that you schedule a follow-up appointment in 1 year. You will receive a reminder letter in the mail two months in advance. If you don't receive a letter, please call our office to schedule the follow-up appointment.  If you need a refill on your cardiac medications before your next appointment, please call your pharmacy.

## 2015-12-18 ENCOUNTER — Other Ambulatory Visit: Payer: 59

## 2015-12-18 ENCOUNTER — Ambulatory Visit: Payer: 59

## 2015-12-19 ENCOUNTER — Ambulatory Visit (INDEPENDENT_AMBULATORY_CARE_PROVIDER_SITE_OTHER): Payer: 59 | Admitting: Endocrinology

## 2015-12-19 ENCOUNTER — Encounter: Payer: Self-pay | Admitting: Endocrinology

## 2015-12-19 VITALS — BP 126/80 | HR 82 | Temp 98.6°F | Resp 12 | Ht 68.0 in | Wt 141.0 lb

## 2015-12-19 DIAGNOSIS — E041 Nontoxic single thyroid nodule: Secondary | ICD-10-CM | POA: Diagnosis not present

## 2015-12-19 NOTE — Progress Notes (Signed)
Subjective:    Patient ID: Makayla Green, female    DOB: [birth year], 45 y.o.   MRN: [id number]  HPI 6 weeks ago, pt was incidentally noted on CT to have a nodule at the left thyroid lobe.  She has slight cold intolerance.  No assoc pain.  she is unaware of ever having had thyroid problems in the past.  she has no h/o XRT or surgery to the neck.   Past Medical History  Diagnosis Date  . Other general counseling and advice for contraceptive management   . Routine general medical examination at a health care facility   . Screening for ischemic heart disease   . Tobacco use disorder   . Abnormal pap     LEEP 2005  . Coronary artery disease   . Asthma     Past Surgical History  Procedure Laterality Date  . Cardiac catheterization N/A 11/14/2015    Procedure: Left Heart Cath and Coronary Angiography;  Surgeon: Iran Ouch, MD;  Location: [facility];  Service: Cardiovascular;  Laterality: N/A;    Social History   Social History  . Marital Status: Married    Spouse Name: N/A  . Number of Children: 3  . Years of Education: N/A   Occupational History  . Not on file.   Social History Main Topics  . Smoking status: Current Every Day Smoker -- 1.00 packs/day for 25 years    Types: Cigarettes  . Smokeless tobacco: Never Used  . Alcohol Use: No  . Drug Use: No  . Sexual Activity: Not on file   Other Topics Concern  . Not on file   Social History Narrative    Current Outpatient Prescriptions on File Prior to Visit  Medication Sig Dispense Refill  . aspirin EC 81 MG tablet Take 1 tablet (81 mg total) by mouth daily. 90 tablet 3  . budesonide-formoterol (SYMBICORT) 160-4.5 MCG/ACT inhaler Inhale 2 puffs into the lungs daily. (Patient taking differently: Inhale 2 puffs into the lungs daily as needed (shortness of breath). ) 1 Inhaler 12  . fexofenadine-pseudoephedrine (ALLEGRA-D 24) 180-240 MG per 24 hr tablet Take 1 tablet by mouth daily. (Patient taking  differently: Take 1 tablet by mouth daily as needed (shortness of breath). ) 30 tablet 3  . fluticasone (FLONASE) 50 MCG/ACT nasal spray Place 2 sprays into both nostrils as needed for allergies.     . Fluticasone-Salmeterol (ADVAIR) 250-50 MCG/DOSE AEPB Inhale 1 puff into the lungs 2 (two) times daily.    . isosorbide mononitrate (IMDUR) 30 MG 24 hr tablet Take 1 tablet (30 mg total) by mouth daily. Take 1/2 tablet Once daily (Patient taking differently: Take 15 mg by mouth daily. ) 30 tablet 11  . medroxyPROGESTERone (DEPO-PROVERA) 150 MG/ML injection Inject 150 mg into the muscle every 3 (three) months.    . rosuvastatin (CRESTOR) 5 MG tablet Take 1 tablet (5 mg total) by mouth daily. 90 tablet 3   No current facility-administered medications on file prior to visit.    Allergies  Allergen Reactions  . Penicillins Shortness Of Breath and Swelling    Has patient had a PCN reaction causing immediate rash, facial/tongue/throat swelling, SOB or lightheadedness with hypotension: Yes Has patient had a PCN reaction causing severe rash involving mucus membranes or skin necrosis: No Has patient had a PCN reaction that required hospitalization No Has patient had a PCN reaction occurring within the last 10 years: No If all of the above answers are "  NO", then may proceed with Cephalosporin use.   . Amitriptyline     Hallucinations  . Doxycycline   . Shellfish Allergy     Patient has not eaten any shellfish and does not know if she is allergic. She has had radiology images using dye with no reactions noted.     Family History  Problem Relation Age of Onset  . COPD Mother   . Heart disease Mother   . Thyroid disease Neg Hx     BP 126/80 mmHg  Pulse 82  Temp(Src) 98.6 F (37 C) (Oral)  Resp 12  Ht 5\' 8"  (1.727 m)  Wt 141 lb (63.957 kg)  BMI 21.44 kg/m2  SpO2 96%  Review of Systems Denies weight change, hoarseness, visual loss, chest pain, sob, dysphagia, skin rash, depression,  headache, numbness, and rhinorrhea.  She has excessive diaphoresis, and easy bruising.      Objective:   Physical Exam VS: see vs page GEN: no distress HEAD: head: no deformity eyes: no periorbital swelling, no proptosis external nose and ears are normal mouth: no lesion seen NECK: the left thyroid nodule is easy palpable, and freely mobile CHEST WALL: no deformity LUNGS: clear to auscultation CV: reg rate and rhythm, no murmur. ABD: abdomen is soft, nontender.  no hepatosplenomegaly.  not distended.  no hernia MUSCULOSKELETAL: muscle bulk and strength are grossly normal.  no obvious joint swelling.  gait is normal and steady EXTEMITIES: no deformity.  no edema PULSES: no carotid bruit NEURO:  cn 2-12 grossly intact.   readily moves all 4's.  sensation is intact to touch on all 4's SKIN:  Normal texture and temperature.  No rash or suspicious lesion is visible.   NODES:  None palpable at the neck.  PSYCH: alert, well-oriented.  Does not appear anxious nor depressed.   Lab Results  Component Value Date   TSH 0.51 11/27/2015   bx: beth cat 2.  US: left thyroid nodule is easily palpable  I have reviewed outside records, and summarized: Pt was noted to have thyroid nodule, and referred here.     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 No rx is needed now.    Patient is advised the following: Patient Instructions  most of the time, a "lumpy thyroid" will eventually become overactive.  this is usually a slow process, happening over the span of many years. Please return in 1 year, when you will be due for a repeat ultrasound and blood test.

## 2015-12-19 NOTE — Patient Instructions (Signed)
most of the time, a "lumpy thyroid" will eventually become overactive.  this is usually a slow process, happening over the span of many years. Please return in 1 year, when you will be due for a repeat ultrasound and blood test.

## 2015-12-20 ENCOUNTER — Other Ambulatory Visit (INDEPENDENT_AMBULATORY_CARE_PROVIDER_SITE_OTHER): Payer: 59

## 2015-12-20 DIAGNOSIS — E785 Hyperlipidemia, unspecified: Secondary | ICD-10-CM | POA: Diagnosis not present

## 2015-12-20 LAB — LIPID PANEL
CHOL/HDL RATIO: 3
Cholesterol: 130 mg/dL (ref 0–200)
HDL: 44 mg/dL (ref >39.00)
LDL CALC: 65 mg/dL (ref 0–99)
NONHDL: 85.91
Triglycerides: 106 mg/dL (ref 0.0–149.0)
VLDL: 21.2 mg/dL (ref 0.0–40.0)

## 2015-12-21 ENCOUNTER — Encounter: Payer: Self-pay | Admitting: Cardiovascular Disease

## 2016-01-01 ENCOUNTER — Ambulatory Visit: Payer: 59 | Admitting: Cardiology

## 2016-01-08 ENCOUNTER — Other Ambulatory Visit: Payer: Self-pay | Admitting: *Deleted

## 2016-01-08 MED ORDER — ROSUVASTATIN CALCIUM 5 MG PO TABS: 5.0000 mg | ORAL_TABLET | Freq: Every day | ORAL | Status: DC

## 2016-01-08 NOTE — Telephone Encounter (Signed)
Pt recently changed to mail order and is requesting a new Rx. Sent as requested.

## 2016-01-11 MED ORDER — ROSUVASTATIN CALCIUM 5 MG PO TABS: 5.0000 mg | ORAL_TABLET | Freq: Every day | ORAL | Status: DC

## 2016-01-11 NOTE — Addendum Note (Signed)
Addended by: Desmond DikeKNIGHT, Phinehas Grounds H on: 01/11/2016 04:42 PM   Modules accepted: Orders

## 2016-01-15 ENCOUNTER — Ambulatory Visit: Payer: 59

## 2016-01-17 ENCOUNTER — Ambulatory Visit (INDEPENDENT_AMBULATORY_CARE_PROVIDER_SITE_OTHER): Payer: 59 | Admitting: *Deleted

## 2016-01-17 DIAGNOSIS — Z308 Encounter for other contraceptive management: Secondary | ICD-10-CM | POA: Diagnosis not present

## 2016-01-17 MED ORDER — MEDROXYPROGESTERONE ACETATE 150 MG/ML IM SUSP
150.0000 mg | Freq: Once | INTRAMUSCULAR | Status: AC
  Administered 2016-01-17: 150 mg via INTRAMUSCULAR

## 2016-01-18 ENCOUNTER — Other Ambulatory Visit: Payer: 59

## 2016-01-22 ENCOUNTER — Ambulatory Visit (INDEPENDENT_AMBULATORY_CARE_PROVIDER_SITE_OTHER): Payer: 59 | Admitting: Family Medicine

## 2016-01-22 ENCOUNTER — Encounter: Payer: Self-pay | Admitting: Family Medicine

## 2016-01-22 VITALS — BP 140/62 | HR 101 | Temp 98.5°F | Wt 138.8 lb

## 2016-01-22 DIAGNOSIS — T300 Burn of unspecified body region, unspecified degree: Secondary | ICD-10-CM

## 2016-01-22 NOTE — Progress Notes (Signed)
Pre visit review using our clinic review tool, if applicable. No additional management support is needed unless otherwise documented below in the visit note. 

## 2016-01-22 NOTE — Patient Instructions (Addendum)
Burn Care °Your skin is a natural barrier to infection. It is the largest organ of your body. Burns damage this natural protection. To help prevent infection, it is very important to follow your caregiver's instructions in the care of your burn. °Burns are classified as: °· First degree. There is only redness of the skin (erythema). No scarring is expected. °· Second degree. There is blistering of the skin. Scarring may occur with deeper burns. °· Third degree. All layers of the skin are injured, and scarring is expected. °HOME CARE INSTRUCTIONS  °· Wash your hands well before changing your bandage. °· Change your bandage as often as directed by your caregiver. °¨ Remove the old bandage. If the bandage sticks, you may soak it off with cool, clean water. °¨ Cleanse the burn thoroughly but gently with mild soap and water. °¨ Pat the area dry with a clean, dry cloth. °¨ Apply a thin layer of antibacterial cream to the burn. °¨ Apply a clean bandage as instructed by your caregiver. °¨ Keep the bandage as clean and dry as possible. °· Elevate the affected area for the first 24 hours, then as instructed by your caregiver. °· Only take over-the-counter or prescription medicines for pain, discomfort, or fever as directed by your caregiver. °SEEK IMMEDIATE MEDICAL CARE IF:  °· You develop excessive pain. °· You develop redness, tenderness, swelling, or red streaks near the burn. °· The burned area develops yellowish-white fluid (pus) or a bad smell. °· You have a fever. °MAKE SURE YOU:  °· Understand these instructions. °· Will watch your condition. °· Will get help right away if you are not doing well or get worse. °  °This information is not intended to replace advice given to you by your health care provider. Make sure you discuss any questions you have with your health care provider. °  °Document Released: 07/21/2005 Document Revised: 10/13/2011 Document Reviewed: 12/11/2010 °Elsevier Interactive Patient Education ©2016  [employer] ° °
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
## 2016-02-04 ENCOUNTER — Encounter: Payer: Self-pay | Admitting: Family Medicine

## 2016-02-06 ENCOUNTER — Encounter: Payer: Self-pay | Admitting: *Deleted

## 2016-02-08 ENCOUNTER — Encounter: Payer: Self-pay | Admitting: Family Medicine

## 2016-02-26 ENCOUNTER — Encounter: Payer: Self-pay | Admitting: Family Medicine

## 2016-03-14 ENCOUNTER — Other Ambulatory Visit: Payer: Self-pay | Admitting: Family Medicine

## 2016-03-31 ENCOUNTER — Encounter: Payer: Self-pay | Admitting: Family Medicine

## 2016-04-08 ENCOUNTER — Encounter: Payer: Self-pay | Admitting: Family Medicine

## 2016-04-09 NOTE — Telephone Encounter (Signed)
Spoke with Myrikal about her mom.

## 2016-04-10 ENCOUNTER — Ambulatory Visit: Payer: 59

## 2016-04-10 ENCOUNTER — Telehealth: Payer: Self-pay | Admitting: Family Medicine

## 2016-04-10 NOTE — Telephone Encounter (Signed)
Pt called to r/s her depo shot.  The next appointment is 9/13.  Is this time frame ok  Please let pt know

## 2016-04-10 NOTE — Telephone Encounter (Signed)
Patient did not come in for their appointment today for depo shot.  Please let me know if patient needs to be contacted immediately for follow up or no follow up needed. °

## 2016-04-10 NOTE — Telephone Encounter (Signed)
Yes please call pt

## 2016-04-10 NOTE — Telephone Encounter (Signed)
Left message asking pt to call office  °

## 2016-04-10 NOTE — Telephone Encounter (Signed)
Yes. She has to get it between Aug 31-Sep 14

## 2016-04-11 NOTE — Telephone Encounter (Signed)
[facility]  I gave her dates for depo  Pt stated she would call back

## 2016-04-14 ENCOUNTER — Encounter: Payer: Self-pay | Admitting: Family Medicine

## 2016-04-14 NOTE — Telephone Encounter (Signed)
Called and spoke with daughter

## 2016-04-15 NOTE — Telephone Encounter (Signed)
L/m to r/s appt  °

## 2016-04-16 ENCOUNTER — Other Ambulatory Visit: Payer: Self-pay | Admitting: Family Medicine

## 2016-04-16 ENCOUNTER — Ambulatory Visit (INDEPENDENT_AMBULATORY_CARE_PROVIDER_SITE_OTHER): Payer: 59 | Admitting: Internal Medicine

## 2016-04-16 DIAGNOSIS — Z308 Encounter for other contraceptive management: Secondary | ICD-10-CM

## 2016-04-16 MED ORDER — MEDROXYPROGESTERONE ACETATE 150 MG/ML IM SUSP
150.0000 mg | INTRAMUSCULAR | Status: DC
  Administered 2016-04-16 – 2016-12-25 (×3): 150 mg via INTRAMUSCULAR

## 2016-04-16 NOTE — Progress Notes (Signed)
Depo given at nurse visit, chart reviewed BAITY, REGINA, NP  

## 2016-04-23 NOTE — Telephone Encounter (Signed)
R/s for 12/5

## 2016-05-18 ENCOUNTER — Encounter: Payer: Self-pay | Admitting: Family Medicine

## 2016-05-21 ENCOUNTER — Encounter: Payer: Self-pay | Admitting: Family Medicine

## 2016-06-18 ENCOUNTER — Encounter: Payer: Self-pay | Admitting: Family Medicine

## 2016-06-19 NOTE — Telephone Encounter (Signed)
Opened in error

## 2016-06-30 ENCOUNTER — Encounter: Payer: Self-pay | Admitting: Family Medicine

## 2016-06-30 MED ORDER — BUDESONIDE-FORMOTEROL FUMARATE 80-4.5 MCG/ACT IN AERO: INHALATION_SPRAY | RESPIRATORY_TRACT | 0 refills | Status: DC

## 2016-07-08 ENCOUNTER — Ambulatory Visit: Payer: 59

## 2016-07-09 ENCOUNTER — Ambulatory Visit: Payer: 59

## 2016-07-10 ENCOUNTER — Ambulatory Visit: Payer: 59

## 2016-07-11 ENCOUNTER — Encounter: Payer: Self-pay | Admitting: Family Medicine

## 2016-07-15 ENCOUNTER — Encounter: Payer: Self-pay | Admitting: Family Medicine

## 2016-07-15 ENCOUNTER — Ambulatory Visit (INDEPENDENT_AMBULATORY_CARE_PROVIDER_SITE_OTHER): Payer: 59

## 2016-07-15 DIAGNOSIS — Z3042 Encounter for surveillance of injectable contraceptive: Secondary | ICD-10-CM

## 2016-07-15 MED ORDER — MEDROXYPROGESTERONE ACETATE 150 MG/ML IM SUSY: 150.0000 mg | PREFILLED_SYRINGE | Freq: Once | INTRAMUSCULAR | Status: DC

## 2016-07-15 MED ORDER — MEDROXYPROGESTERONE ACETATE 150 MG/ML IM SUSY
150.0000 mg | PREFILLED_SYRINGE | Freq: Once | INTRAMUSCULAR | Status: AC
  Administered 2016-07-15: 150 mg via INTRAMUSCULAR

## 2016-09-08 ENCOUNTER — Encounter: Payer: Self-pay | Admitting: Family Medicine

## 2016-09-16 ENCOUNTER — Other Ambulatory Visit: Payer: Self-pay | Admitting: Family Medicine

## 2016-09-28 ENCOUNTER — Other Ambulatory Visit: Payer: Self-pay | Admitting: Family Medicine

## 2016-10-01 ENCOUNTER — Ambulatory Visit (INDEPENDENT_AMBULATORY_CARE_PROVIDER_SITE_OTHER): Payer: 59 | Admitting: *Deleted

## 2016-10-01 DIAGNOSIS — Z308 Encounter for other contraceptive management: Secondary | ICD-10-CM

## 2016-10-01 DIAGNOSIS — Z3042 Encounter for surveillance of injectable contraceptive: Secondary | ICD-10-CM

## 2016-11-10 ENCOUNTER — Other Ambulatory Visit: Payer: Self-pay | Admitting: Cardiology

## 2016-11-21 ENCOUNTER — Encounter: Payer: Self-pay | Admitting: Family Medicine

## 2016-11-21 ENCOUNTER — Ambulatory Visit (INDEPENDENT_AMBULATORY_CARE_PROVIDER_SITE_OTHER): Payer: 59 | Admitting: Family Medicine

## 2016-11-21 VITALS — BP 126/70 | HR 78 | Temp 98.4°F | Wt 146.8 lb

## 2016-11-21 DIAGNOSIS — J3089 Other allergic rhinitis: Secondary | ICD-10-CM | POA: Insufficient documentation

## 2016-11-21 DIAGNOSIS — F17200 Nicotine dependence, unspecified, uncomplicated: Secondary | ICD-10-CM | POA: Diagnosis not present

## 2016-11-21 DIAGNOSIS — J302 Other seasonal allergic rhinitis: Secondary | ICD-10-CM | POA: Diagnosis not present

## 2016-11-21 MED ORDER — FLUTICASONE PROPIONATE 50 MCG/ACT NA SUSP: 2.0000 | Freq: Every day | NASAL | 6 refills | Status: DC

## 2016-11-21 MED ORDER — PREDNISONE 20 MG PO TABS: ORAL_TABLET | ORAL | 0 refills | Status: DC

## 2016-11-21 NOTE — Patient Instructions (Addendum)
I think you have allergy flare. Treat with prednisone course. Then start nasal steroid (flonase sent to pharmacy). Continue allegra.  Let us know if ongoing or worsening symptoms after prednisone course.  Work on quitting smoking.  Shower after exposure to pine. Use nasal saline to wash allergens out of nose.

## 2016-11-21 NOTE — Assessment & Plan Note (Signed)
Continue to encourage cessation. 

## 2016-11-21 NOTE — Progress Notes (Signed)
BP 126/70   Pulse 78   Temp 98.4 F (36.9 C) (Oral)   Wt 146 lb 12 oz (66.6 kg)   SpO2 96%   BMI 22.31 kg/m    CC: sinusitis? Subjective:    Patient ID: Makayla Green, female    DOB: 18-Feb-1971, 46 y.o.   MRN: [id number]  HPI: Makayla Green is a 46 y.o. female presenting on 11/21/2016 for Pressure Behind the Eyes and Nasal Congestion   2 wk h/o increased congestion, ear pressure and R maxillary pressure. No significant discharge from nose.   No fevers/chills, headache, tooth pain, ST, PNdrainage, cough. No dyspnea or wheezing.   Treating with mucinex nasal spray, allegra.   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  Current 1 ppd smoker.  She does use symbicort in the mornings. She doesn't have albuterol inhaler. Endorses h/o asthma.   Relevant past medical, surgical, family and social history reviewed and updated as indicated. Interim medical history since our last visit reviewed. Allergies and medications reviewed and updated. Outpatient Medications Prior to Visit  Medication Sig Dispense Refill  . aspirin EC 81 MG tablet Take 1 tablet (81 mg total) by mouth daily. 90 tablet 3  . fexofenadine-pseudoephedrine (ALLEGRA-D 24) 180-240 MG per 24 hr tablet Take 1 tablet by mouth daily. (Patient taking differently: Take 1 tablet by mouth daily as needed (shortness of breath). ) 30 tablet 3  . isosorbide mononitrate (IMDUR) 30 MG 24 hr tablet TAKE 1/2 TABLET BY MOUTH DAILY 30 tablet 0  . medroxyPROGESTERone (DEPO-PROVERA) 150 MG/ML injection Inject 150 mg into the muscle every 3 (three) months.    . rosuvastatin (CRESTOR) 5 MG tablet TAKE 1 TABLET DAILY 90 tablet 0  . SYMBICORT 160-4.5 MCG/ACT inhaler INHALE 2 PUFFS INTO THE LUNGS DAILY. 10.2 g 2  . fluticasone (FLONASE) 50 MCG/ACT nasal spray Place 2 sprays into both nostrils as needed for allergies.      Facility-Administered Medications Prior to Visit  Medication Dose Route Frequency Provider Last Rate  Last Dose  . medroxyPROGESTERone (DEPO-PROVERA) injection 150 mg  150 mg Intramuscular Q90 days Dianne Dun, MD   150 mg at 10/01/16 4098     Per HPI unless specifically indicated in ROS section below Review of Systems     Objective:    BP 126/70   Pulse 78   Temp 98.4 F (36.9 C) (Oral)   Wt 146 lb 12 oz (66.6 kg)   SpO2 96%   BMI 22.31 kg/m   Wt Readings from Last 3 Encounters:  11/21/16 146 lb 12 oz (66.6 kg)  01/22/16 138 lb 12 oz (62.9 kg)  12/19/15 141 lb (64 kg)    Physical Exam  Constitutional: She appears well-developed and well-nourished. No distress.  HENT:  Head: Normocephalic and atraumatic.  Right Ear: Hearing, tympanic membrane, external ear and ear canal normal.  Left Ear: Hearing, tympanic membrane, external ear and ear canal normal.  Nose: Rhinorrhea present. No mucosal edema. Right sinus exhibits maxillary sinus tenderness (mild). Right sinus exhibits no frontal sinus tenderness. Left sinus exhibits no maxillary sinus tenderness and no frontal sinus tenderness.  Mouth/Throat: Uvula is midline, oropharynx is clear and moist and mucous membranes are normal. No oropharyngeal exudate, posterior oropharyngeal edema, posterior oropharyngeal erythema or tonsillar abscesses.  Nasal mucosal congestion without significant edema  Eyes: Conjunctivae and EOM are normal. Pupils are equal, round, and reactive to light. No scleral icterus.  Neck: Normal range of motion. Neck  supple.  Cardiovascular: Normal rate, regular rhythm, normal heart sounds and intact distal pulses.   No murmur heard. Pulmonary/Chest: Effort normal and breath sounds normal. No respiratory distress. She has no wheezes. She has no rales.  coarse  Lymphadenopathy:    She has no cervical adenopathy.  Skin: Skin is warm and dry. No rash noted.  Nursing note and vitals reviewed.  Results for orders placed or performed in visit on 12/20/15  Lipid panel  Result Value Ref Range   Cholesterol 130 0 -  200 mg/dL   Triglycerides 161.0 0.0 - 149.0 mg/dL   HDL 96.04 >54.09 mg/dL   VLDL 81.1 0.0 - 91.4 mg/dL   LDL Cholesterol 65 0 - 99 mg/dL   Total CHOL/HDL Ratio 3    NonHDL 85.91       Assessment & Plan:   Problem List Items Addressed This Visit    Seasonal allergic rhinitis - Primary    Anticipate acute flare of allergic rhinitis. No signs of bacterial infection at this time. Treat with prednisone course, then start flonase. Continue allegra. Allergen avoidance reviewed. Update if not improving or worsening symptoms. Pt agrees with plan.       TOBACCO ABUSE    Continue to encourage cessation.           Follow up plan: Return if symptoms worsen or fail to improve.  Eustaquio Boyden, MD

## 2016-11-21 NOTE — Assessment & Plan Note (Signed)
Anticipate acute flare of allergic rhinitis. No signs of bacterial infection at this time. Treat with prednisone course, then start flonase. Continue allegra. Allergen avoidance reviewed. Update if not improving or worsening symptoms. Pt agrees with plan.

## 2016-11-21 NOTE — Progress Notes (Signed)
Pre visit review using our clinic review tool, if applicable. No additional management support is needed unless otherwise documented below in the visit note. 

## 2016-12-18 ENCOUNTER — Ambulatory Visit: Payer: 59 | Admitting: Endocrinology

## 2016-12-20 ENCOUNTER — Other Ambulatory Visit: Payer: Self-pay | Admitting: Family Medicine

## 2016-12-20 ENCOUNTER — Other Ambulatory Visit: Payer: Self-pay | Admitting: Cardiology

## 2016-12-22 ENCOUNTER — Other Ambulatory Visit: Payer: Self-pay | Admitting: Cardiology

## 2016-12-22 MED ORDER — ROSUVASTATIN CALCIUM 5 MG PO TABS: 5.0000 mg | ORAL_TABLET | Freq: Every day | ORAL | 0 refills | Status: DC

## 2016-12-22 MED ORDER — PREDNISONE 20 MG PO TABS: ORAL_TABLET | ORAL | 0 refills | Status: DC

## 2016-12-22 NOTE — Telephone Encounter (Signed)
Duplicate request

## 2016-12-25 ENCOUNTER — Ambulatory Visit (INDEPENDENT_AMBULATORY_CARE_PROVIDER_SITE_OTHER): Payer: 59 | Admitting: *Deleted

## 2016-12-25 DIAGNOSIS — Z308 Encounter for other contraceptive management: Secondary | ICD-10-CM | POA: Diagnosis not present

## 2016-12-25 DIAGNOSIS — Z3042 Encounter for surveillance of injectable contraceptive: Secondary | ICD-10-CM

## 2017-01-02 ENCOUNTER — Telehealth: Payer: Self-pay | Admitting: Cardiovascular Disease

## 2017-01-02 MED ORDER — ISOSORBIDE MONONITRATE ER 30 MG PO TB24: 15.0000 mg | ORAL_TABLET | Freq: Every day | ORAL | 1 refills | Status: DC

## 2017-01-02 NOTE — Telephone Encounter (Signed)
Refill sent to the pharmacy electronically.  

## 2017-01-02 NOTE — Telephone Encounter (Signed)
New message       *STAT* If patient is at the pharmacy, call can be transferred to refill team.   1. Which medications need to be refilled? (please list name of each medication and dose if known)  imdur 30mg   2. Which pharmacy/location (including street and city if local pharmacy) is medication to be sent to? [facility] [place]. Do they need a 30 day or 90 day supply? Need enough to last until appt on 03-19-17

## 2017-01-08 ENCOUNTER — Other Ambulatory Visit: Payer: Self-pay | Admitting: Family Medicine

## 2017-01-20 ENCOUNTER — Other Ambulatory Visit: Payer: Self-pay | Admitting: Family Medicine

## 2017-01-22 MED ORDER — ROSUVASTATIN CALCIUM 5 MG PO TABS: 5.0000 mg | ORAL_TABLET | Freq: Every day | ORAL | 0 refills | Status: DC

## 2017-01-22 MED ORDER — BUDESONIDE-FORMOTEROL FUMARATE 160-4.5 MCG/ACT IN AERO: 2.0000 | INHALATION_SPRAY | Freq: Every day | RESPIRATORY_TRACT | 0 refills | Status: DC

## 2017-01-29 ENCOUNTER — Ambulatory Visit: Payer: 59 | Admitting: Endocrinology

## 2017-02-02 ENCOUNTER — Encounter: Payer: Self-pay | Admitting: Family Medicine

## 2017-02-05 ENCOUNTER — Encounter: Payer: Self-pay | Admitting: Family Medicine

## 2017-02-23 ENCOUNTER — Ambulatory Visit (INDEPENDENT_AMBULATORY_CARE_PROVIDER_SITE_OTHER): Payer: 59 | Admitting: Endocrinology

## 2017-02-23 ENCOUNTER — Encounter: Payer: Self-pay | Admitting: Endocrinology

## 2017-02-23 VITALS — BP 116/80 | HR 80 | Ht 68.0 in | Wt 153.0 lb

## 2017-02-23 DIAGNOSIS — E041 Nontoxic single thyroid nodule: Secondary | ICD-10-CM

## 2017-02-23 NOTE — Progress Notes (Signed)
Subjective:    Patient ID: Makayla Green, female    DOB: Jul 16, 1971, 46 y.o.   MRN: [id number]  HPI  Pt returns for f/u of multinodular goiter (dx'ed 2017, incidentally on CT; bx in 2017 was Cat 2). She does not notice the goiter.  pt states she feels well in general.  Past Medical History:  Diagnosis Date  . Abnormal pap    LEEP 2005  . Asthma   . Coronary artery disease   . Other general counseling and advice for contraceptive management   . Routine general medical examination at a health care facility   . Screening for ischemic heart disease   . Tobacco use disorder     Past Surgical History:  Procedure Laterality Date  . CARDIAC CATHETERIZATION N/A 11/14/2015   Procedure: Left Heart Cath and Coronary Angiography;  Surgeon: Iran Ouch, MD;  Location: [facility];  Service: Cardiovascular;  Laterality: N/A;    Social History   Social History  . Marital status: Married    Spouse name: N/A  . Number of children: 3  . Years of education: N/A   Occupational History  . Not on file.   Social History Main Topics  . Smoking status: Current Every Day Smoker    Packs/day: 1.00    Years: 25.00    Types: Cigarettes  . Smokeless tobacco: Never Used  . Alcohol use No  . Drug use: No  . Sexual activity: Not on file   Other Topics Concern  . Not on file   Social History Narrative  . No narrative on file    Current Outpatient Prescriptions on File Prior to Visit  Medication Sig Dispense Refill  . aspirin EC 81 MG tablet Take 1 tablet (81 mg total) by mouth daily. 90 tablet 3  . budesonide-formoterol (SYMBICORT) 160-4.5 MCG/ACT inhaler Inhale 2 puffs into the lungs daily. 10.2 g 0  . fexofenadine-pseudoephedrine (ALLEGRA-D 24) 180-240 MG per 24 hr tablet Take 1 tablet by mouth daily. (Patient taking differently: Take 1 tablet by mouth daily as needed (shortness of breath). ) 30 tablet 3  . fluticasone (FLONASE) 50 MCG/ACT nasal spray Place 2 sprays into  both nostrils daily. 16 g 6  . isosorbide mononitrate (IMDUR) 30 MG 24 hr tablet Take 0.5 tablets (15 mg total) by mouth daily. 45 tablet 1  . rosuvastatin (CRESTOR) 5 MG tablet Take 1 tablet (5 mg total) by mouth daily. 30 tablet 0   Current Facility-Administered Medications on File Prior to Visit  Medication Dose Route Frequency Provider Last Rate Last Dose  . medroxyPROGESTERone (DEPO-PROVERA) injection 150 mg  150 mg Intramuscular Q90 days Dianne Dun, MD   150 mg at 12/25/16 1609    Allergies  Allergen Reactions  . Penicillins Shortness Of Breath and Swelling    Has patient had a PCN reaction causing immediate rash, facial/tongue/throat swelling, SOB or lightheadedness with hypotension: Yes Has patient had a PCN reaction causing severe rash involving mucus membranes or skin necrosis: No Has patient had a PCN reaction that required hospitalization No Has patient had a PCN reaction occurring within the last 10 years: No If all of the above answers are "NO", then may proceed with Cephalosporin use.   . Amitriptyline     Hallucinations  . Doxycycline   . Shellfish Allergy     Patient has not eaten any shellfish and does not know if she is allergic. She has had radiology images using dye with no  reactions noted.     Family History  Problem Relation Age of Onset  . COPD Mother   . Heart disease Mother   . Thyroid disease Neg Hx     BP 116/80   Pulse 80   Ht 5\' 8"  (1.727 m)   Wt 153 lb (69.4 kg)   SpO2 98%   BMI 23.26 kg/m   Review of Systems Denies sob and dysphagia    Objective:   Physical Exam VS: see vs page GEN: no distress NECK: the left thyroid nodule is easy palpable, 1-2 cm diameter, and freely mobile  Lab Results  Component Value Date   TSH 0.52 02/23/2017      Assessment & Plan:  Multinodular goiter: clinically unchanged.  she declines f/u us this year. Please return in 1 year.

## 2017-02-23 NOTE — Patient Instructions (Addendum)
blood tests are requested for you today.  We'll let you know about the results. most of the time, a "lumpy thyroid" will eventually become overactive.  this is usually a slow process, happening over the span of many years. We can skip the ultrasound this year. Please return in 1 year, when you will be due for a repeat ultrasound and blood test.

## 2017-02-24 ENCOUNTER — Encounter: Payer: Self-pay | Admitting: Endocrinology

## 2017-02-24 LAB — T4, FREE: FREE T4: 1.1 ng/dL (ref 0.60–1.60)

## 2017-02-24 LAB — TSH: TSH: 0.52 u[IU]/mL (ref 0.35–4.50)

## 2017-03-16 ENCOUNTER — Encounter: Payer: Self-pay | Admitting: Cardiovascular Disease

## 2017-03-19 ENCOUNTER — Ambulatory Visit (INDEPENDENT_AMBULATORY_CARE_PROVIDER_SITE_OTHER): Payer: 59 | Admitting: Cardiovascular Disease

## 2017-03-19 ENCOUNTER — Encounter: Payer: Self-pay | Admitting: Cardiovascular Disease

## 2017-03-19 VITALS — BP 94/68 | HR 83 | Ht 67.0 in | Wt 150.0 lb

## 2017-03-19 DIAGNOSIS — F17200 Nicotine dependence, unspecified, uncomplicated: Secondary | ICD-10-CM

## 2017-03-19 DIAGNOSIS — I25118 Atherosclerotic heart disease of native coronary artery with other forms of angina pectoris: Secondary | ICD-10-CM

## 2017-03-19 NOTE — Patient Instructions (Signed)
Dr Croitoru recommends that you schedule a follow-up appointment in 12 months. You will receive a reminder letter in the mail two months in advance. If you don't receive a letter, please call our office to schedule the follow-up appointment.  If you need a refill on your cardiac medications before your next appointment, please call your pharmacy. 

## 2017-03-19 NOTE — Progress Notes (Signed)
Patient ID: Makayla Green, female   DOB: October 21, 1970, 46 y.o.   MRN: [id number]    Cardiology Office Note    Date:  03/19/2017   ID:  Makayla Green, DOB Jan 15, 1971, MRN [id number]  PCP:  Dianne Dun, MD  Cardiologist:   Thurmon Fair, MD   Chief Complaint  Patient presents with  . Follow-up    no chest pain, shortness of breath, edema, lightheaded ot dizziness,occassional pain or cramping in legs-at night     History of Present Illness:  Makayla Green is a 46 y.o. female with atypical chest discomfort and incidentally discovered coronary calcification on CT of the chest, leading to cardiac catheterization performed April 12 by Dr. Kirke Corin, which showed minor (10%) plaque in the proximal LAD artery, corresponding to the calcium deposits seen on CT of the chest. Treatment with isosorbide has led to the resolution of her chest discomfort which is actually a left scapular burning sensation. However the isosorbide led to dizziness and fatigue, But she tolerates as well as longer she takes it at night.  She has only had one episode of her usual angina during the last year, after a hard day of housework. It resolved promptly after taking one sublingual nitroglycerin and has not recurred.   he continues to smoke, although not always a whole pack a day. She has previously tried smoking cessation with Chantix and Wellbutrin which caused a lot of problems with anger and emotional instability. She gets a rash from the DC of on nicotine patches available commercially. The rash is exclusively around the outer edge and seems to be related to the adhesive. She tried switching to a vape device, but this led to worsening asthma and she was actually advised by her pulmonary specialist to stop using it since it was worse than cigarettes.  Her entire family smokes. Her father, Makayla Green has also seen as a patient. He was a [place] veteran and they both wonder whether Agent Erskine Emery might have  been responsible for their health problems.   Past Medical History:  Diagnosis Date  . Abnormal pap    LEEP 2005  . Asthma   . Coronary artery disease   . Other general counseling and advice for contraceptive management   . Routine general medical examination at a health care facility   . Screening for ischemic heart disease   . Tobacco use disorder     Past Surgical History:  Procedure Laterality Date  . CARDIAC CATHETERIZATION N/A 11/14/2015   Procedure: Left Heart Cath and Coronary Angiography;  Surgeon: Iran Ouch, MD;  Location: [facility];  Service: Cardiovascular;  Laterality: N/A;    Current Medications: Outpatient Medications Prior to Visit  Medication Sig Dispense Refill  . aspirin EC 81 MG tablet Take 1 tablet (81 mg total) by mouth daily. 90 tablet 3  . budesonide-formoterol (SYMBICORT) 160-4.5 MCG/ACT inhaler Inhale 2 puffs into the lungs daily. 10.2 g 0  . fexofenadine-pseudoephedrine (ALLEGRA-D 24) 180-240 MG per 24 hr tablet Take 1 tablet by mouth daily. (Patient taking differently: Take 1 tablet by mouth daily as needed (shortness of breath). ) 30 tablet 3  . fluticasone (FLONASE) 50 MCG/ACT nasal spray Place 2 sprays into both nostrils daily. 16 g 6  . isosorbide mononitrate (IMDUR) 30 MG 24 hr tablet Take 0.5 tablets (15 mg total) by mouth daily. 45 tablet 1  . rosuvastatin (CRESTOR) 5 MG tablet Take 1 tablet (5 mg total) by mouth daily. 30  tablet 0   Facility-Administered Medications Prior to Visit  Medication Dose Route Frequency Provider Last Rate Last Dose  . medroxyPROGESTERone (DEPO-PROVERA) injection 150 mg  150 mg Intramuscular Q90 days Dianne DunAron, Talia M, MD   150 mg at 12/25/16 1609     Allergies:   Penicillins; Amitriptyline; Doxycycline; and Shellfish allergy   Social History   Social History  . Marital status: Married    Spouse name: N/A  . Number of children: 3  . Years of education: N/A   Social History Main Topics  . Smoking  status: Current Every Day Smoker    Packs/day: 1.00    Years: 25.00    Types: Cigarettes  . Smokeless tobacco: Never Used  . Alcohol use No  . Drug use: No  . Sexual activity: Not Asked   Other Topics Concern  . None   Social History Narrative  . None     Family History:  The patient's family history includes COPD in her mother; Heart disease in her mother; Neuropathy in her father.   ROS:   Please see the history of present illness.    ROS All other systems reviewed and are negative.   PHYSICAL EXAM:   VS:  BP 94/68   Pulse 83   Ht 5\' 7"  (1.702 m)   Wt 150 lb (68 kg)   BMI 23.49 kg/m    GEN: Well nourished, well developed, in no acute distress   General: Alert, oriented x3, no distress Head: no evidence of trauma, PERRL, EOMI, no exophtalmos or lid lag, no myxedema, no xanthelasma; normal ears, nose and oropharynx Neck: normal jugular venous pulsations and no hepatojugular reflux; brisk carotid pulses without delay and no carotid bruits Chest: clear to auscultation, no signs of consolidation by percussion or palpation, normal fremitus, symmetrical and full respiratory excursions Cardiovascular: normal position and quality of the apical impulse, regular rhythm, normal first and second heart sounds, no murmurs, rubs or gallops Abdomen: no tenderness or distention, no masses by palpation, no abnormal pulsatility or arterial bruits, normal bowel sounds, no hepatosplenomegaly Extremities: no clubbing, cyanosis or edema; 2+ radial, ulnar and brachial pulses bilaterally; 2+ right femoral, posterior tibial and dorsalis pedis pulses; 2+ left femoral, posterior tibial and dorsalis pedis pulses; no subclavian or femoral bruits Neurological: grossly nonfocal Psych: euthymic mood, full affect  Wt Readings from Last 3 Encounters:  03/19/17 150 lb (68 kg)  02/23/17 153 lb (69.4 kg)  11/21/16 146 lb 12 oz (66.6 kg)      Studies/Labs Reviewed:   EKG:  EKG is ordered today. It shows  sinus rhythm with questionable right atrial enlargement, incomplete right bundle branch block, pulmonary disease pattern, QTC 415 ms  Recent Labs: 02/23/2017: TSH 0.52   Lipid Panel    Component Value Date/Time   CHOL 130 12/20/2015 1203   TRIG 106.0 12/20/2015 1203   HDL 44.00 12/20/2015 1203   CHOLHDL 3 12/20/2015 1203   VLDL 21.2 12/20/2015 1203   LDLCALC 65 12/20/2015 1203      ASSESSMENT:    1. Coronary artery disease involving native coronary artery of native heart with other form of angina pectoris (HCC)   2. TOBACCO ABUSE      PLAN:  In order of problems listed above:  1. CAD: Tammy did not have significant coronary atherosclerotic stenoses at angiography, although mild atherosclerotic plaque was present. 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 Her blood pressure precludes the use of other vasodilators. Reviewed the fact that smoking is almost certainly the major cause of her symptoms and that smoking cessation is critical for long-term health. 2. She she reports trying to quit cigarette smoking, but seems to have had some type of difficulty with any smoking cessation aid. Reviewed the fact that smoking cessation aids are just that, only additional help and that most of the work has to be behavioral/lifestyle changes. Repeated my advice that she can apply the central nicotine containing part of the nicotine patch with a Band-Aid just like we discussed last year.    Medication Adjustments/Labs and Tests Ordered: Current medicines are reviewed at length with the patient today.  Concerns regarding medicines are outlined above.  Medication changes, Labs and Tests ordered today are listed in the Patient Instructions below. Patient Instructions  Dr Royann Shivers recommends that you schedule a follow-up appointment in 12 months. You will receive a reminder letter in  the mail two months in advance. If you don't receive a letter, please call our office to schedule the follow-up appointment.  If you need a refill on your cardiac medications before your next appointment, please call your pharmacy.    Signed, Thurmon Fair, MD  03/19/2017 9:46 AM    [facility] [address] [place], [place], [place]  [address] Phone: [phone]; Fax: [phone]

## 2017-03-23 ENCOUNTER — Telehealth: Payer: Self-pay

## 2017-03-23 NOTE — Telephone Encounter (Signed)
Pt left v/m requesting cb to schedule depo; left v/m requesting cb.

## 2017-03-25 ENCOUNTER — Ambulatory Visit (INDEPENDENT_AMBULATORY_CARE_PROVIDER_SITE_OTHER): Payer: 59

## 2017-03-25 DIAGNOSIS — Z3042 Encounter for surveillance of injectable contraceptive: Secondary | ICD-10-CM | POA: Diagnosis not present

## 2017-03-25 MED ORDER — MEDROXYPROGESTERONE ACETATE 150 MG/ML IM SUSY
150.0000 mg | PREFILLED_SYRINGE | Freq: Once | INTRAMUSCULAR | Status: AC
  Administered 2017-03-25: 150 mg via INTRAMUSCULAR

## 2017-03-25 NOTE — Telephone Encounter (Signed)
Pt came in this morning for depo.

## 2017-04-20 ENCOUNTER — Encounter: Payer: Self-pay | Admitting: Family Medicine

## 2017-04-26 ENCOUNTER — Other Ambulatory Visit: Payer: Self-pay | Admitting: Family Medicine

## 2017-04-27 MED ORDER — ROSUVASTATIN CALCIUM 5 MG PO TABS
5.0000 mg | ORAL_TABLET | Freq: Every day | ORAL | 0 refills | Status: DC
Start: 2017-04-27 — End: 2017-05-05

## 2017-05-04 ENCOUNTER — Other Ambulatory Visit: Payer: Self-pay | Admitting: Family Medicine

## 2017-05-04 ENCOUNTER — Encounter: Payer: Self-pay | Admitting: Cardiovascular Disease

## 2017-05-05 ENCOUNTER — Other Ambulatory Visit: Payer: Self-pay

## 2017-05-05 MED ORDER — ROSUVASTATIN CALCIUM 5 MG PO TABS: 5.0000 mg | ORAL_TABLET | Freq: Every day | ORAL | 11 refills | Status: DC

## 2017-05-05 MED ORDER — ISOSORBIDE MONONITRATE ER 30 MG PO TB24: 15.0000 mg | ORAL_TABLET | Freq: Every day | ORAL | 11 refills | Status: DC

## 2017-05-11 ENCOUNTER — Encounter: Payer: Self-pay | Admitting: Family Medicine

## 2017-05-27 ENCOUNTER — Other Ambulatory Visit: Payer: Self-pay | Admitting: Family Medicine

## 2017-05-28 NOTE — Telephone Encounter (Signed)
Pt must sched appt first/TA has not seen pt since 6.2017/thx dmf

## 2017-06-10 ENCOUNTER — Ambulatory Visit (INDEPENDENT_AMBULATORY_CARE_PROVIDER_SITE_OTHER): Payer: 59 | Admitting: *Deleted

## 2017-06-10 DIAGNOSIS — Z3042 Encounter for surveillance of injectable contraceptive: Secondary | ICD-10-CM | POA: Diagnosis not present

## 2017-06-10 MED ORDER — MEDROXYPROGESTERONE ACETATE 150 MG/ML IM SUSP
150.0000 mg | Freq: Once | INTRAMUSCULAR | Status: AC
  Administered 2017-06-10: 150 mg via INTRAMUSCULAR

## 2017-06-13 ENCOUNTER — Other Ambulatory Visit: Payer: Self-pay | Admitting: Family Medicine

## 2017-06-14 ENCOUNTER — Other Ambulatory Visit: Payer: Self-pay | Admitting: Family Medicine

## 2017-06-15 NOTE — Telephone Encounter (Signed)
Already faxed over/thx dmf

## 2017-06-15 NOTE — Telephone Encounter (Signed)
Already faxed over/thx dmf 

## 2017-07-09 ENCOUNTER — Other Ambulatory Visit: Payer: Self-pay | Admitting: Family Medicine

## 2017-07-10 NOTE — Telephone Encounter (Signed)
JC-Can you see if you think this is an appropriate refill? You have seen this pt several times and Dr. Dayton MartesAron has not seen in 2018/plz advise/thx dmf

## 2017-07-13 IMAGING — CT CT CHEST W/ CM
1 series · 15 of 33 positions shown, 19 images · IV contrast (iopamidol)
Comparison: June 07, 2014

CLINICAL DATA: Chronic cough.  Pulmonary nodule

EXAM:
CT CHEST WITH CONTRAST
TECHNIQUE: Multidetector CT imaging of the chest was performed during
intravenous contrast administration.
CONTRAST:  75mL YWAS4B-AZZ IOPAMIDOL (YWAS4B-AZZ) INJECTION 61%

[Series 2: routine chest with · axial · 0.67mm/px · z∈[-684,-354]mm · 15 of 78 slices shown, 19 images]
[im 6/78  mediastinal]
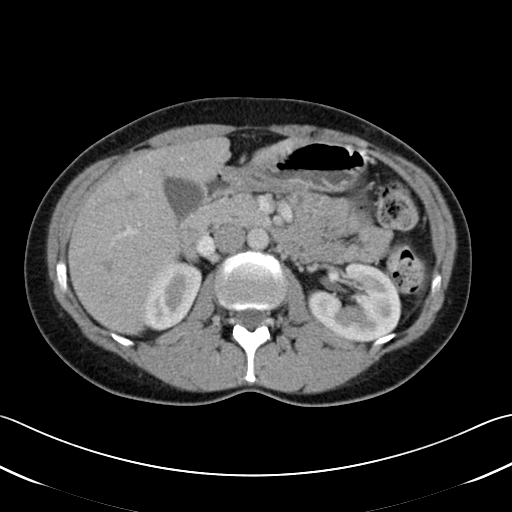
[im 6/78  lung]
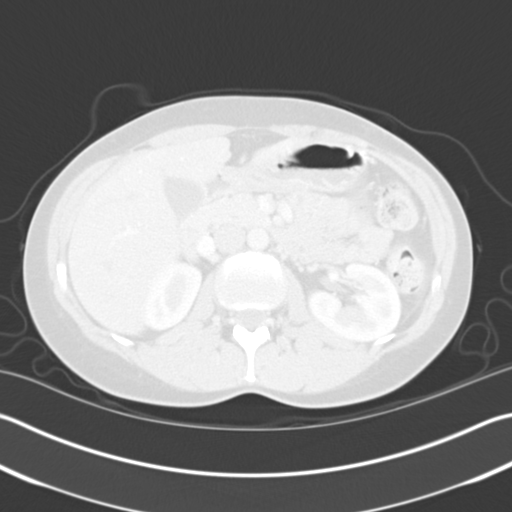
[im 12/78  lung]
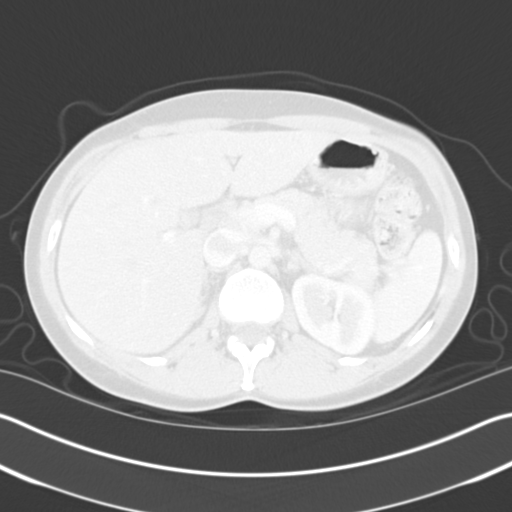
[im 16/78  lung]
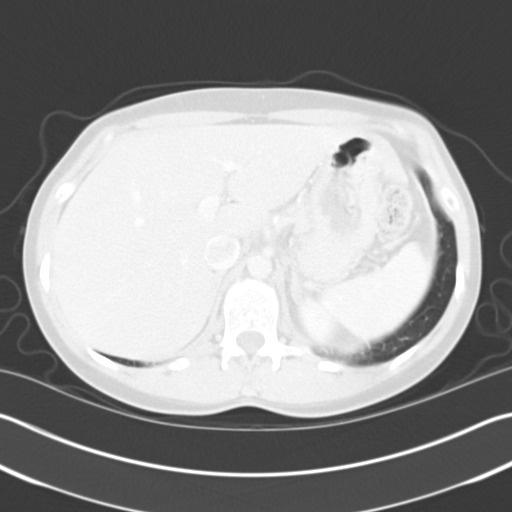
[im 20/78  lung]
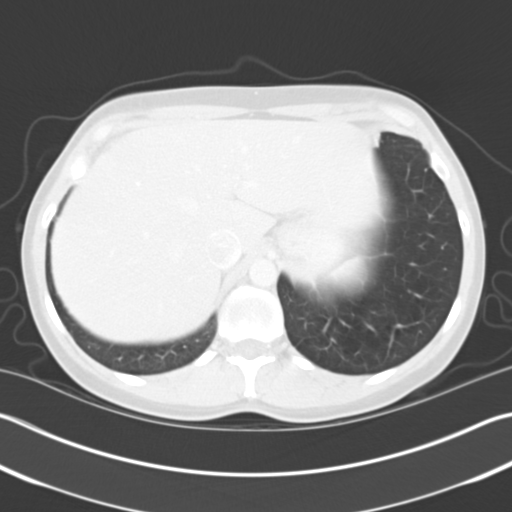
[im 26/78  mediastinal]
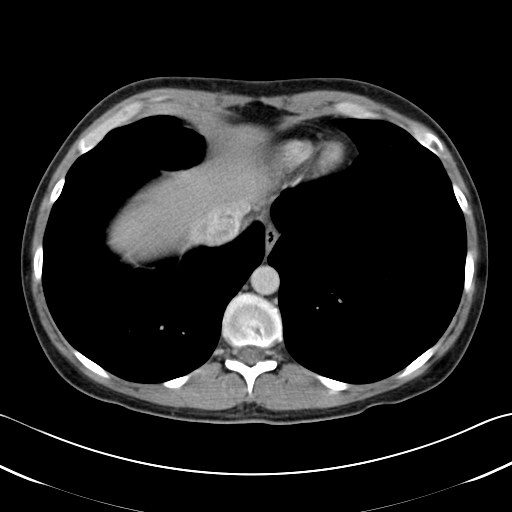
[im 26/78  lung]
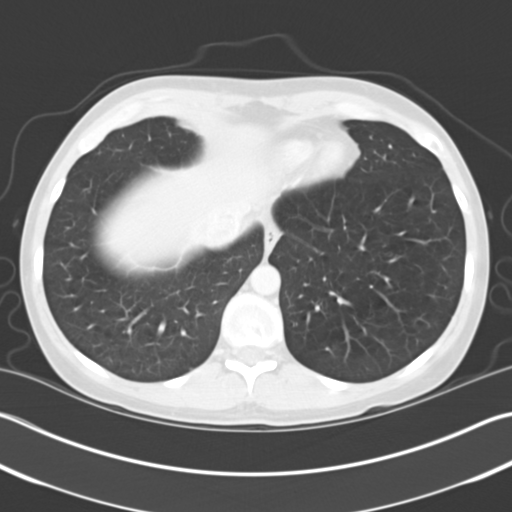
[im 31/78  lung]
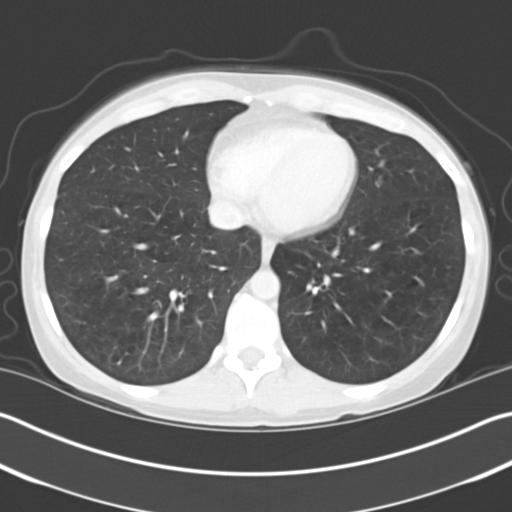
[im 35/78  lung]
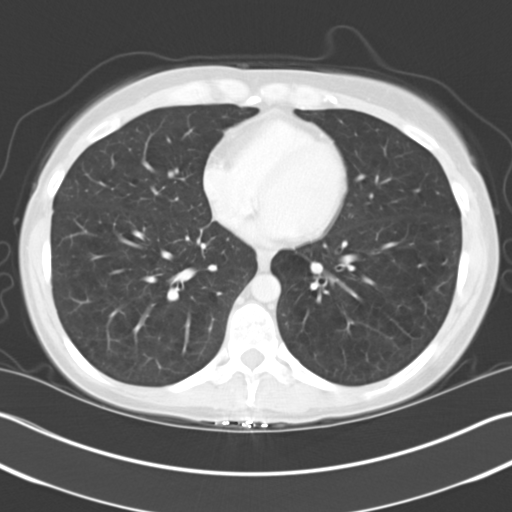
[im 40/78  lung]
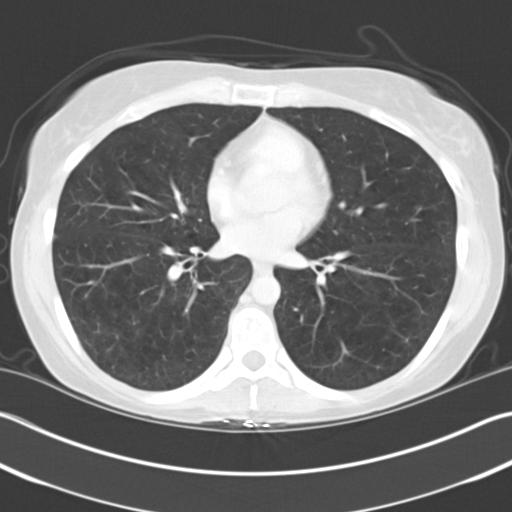
[im 43/78  mediastinal]
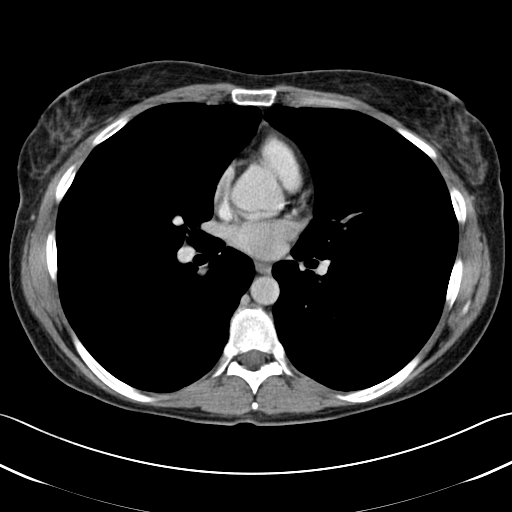
[im 43/78  lung]
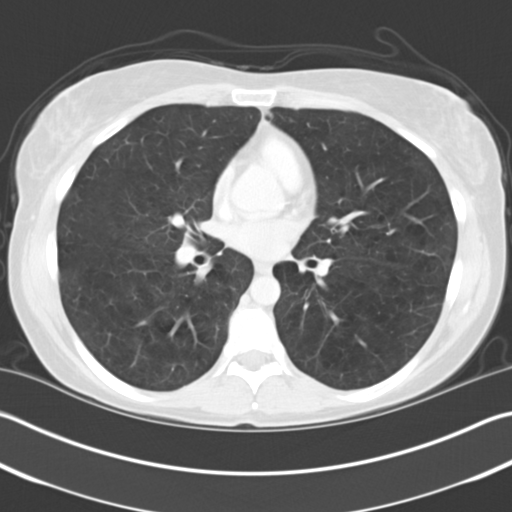
[im 47/78  lung]
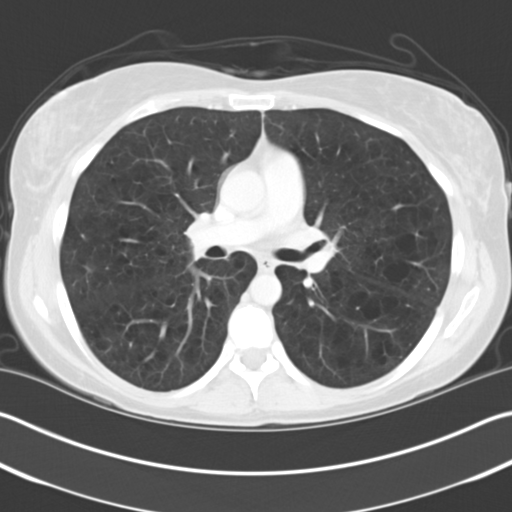
[im 52/78  lung]
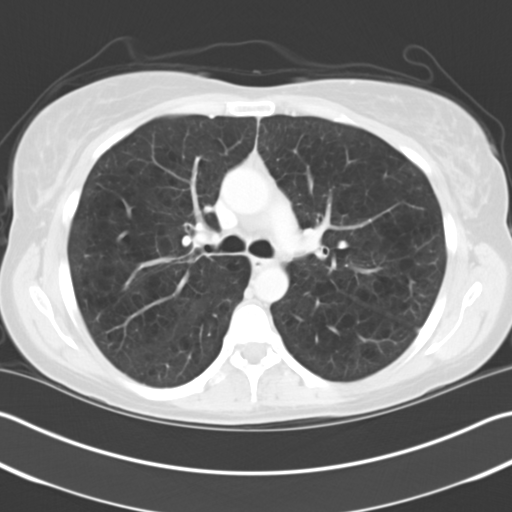
[im 58/78  lung]
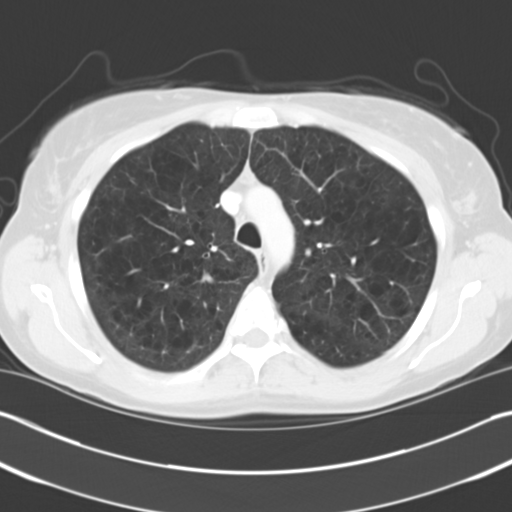
[im 62/78  mediastinal]
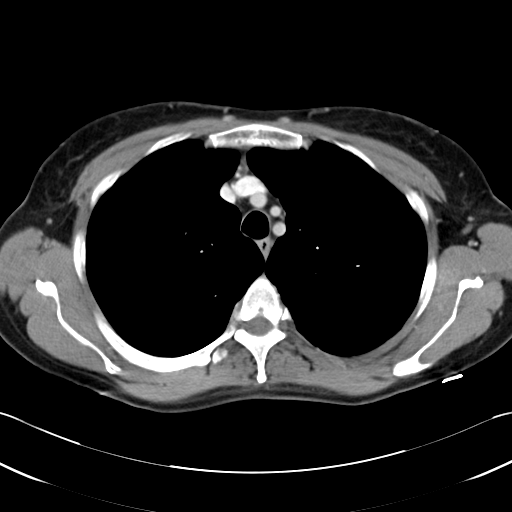
[im 62/78  lung]
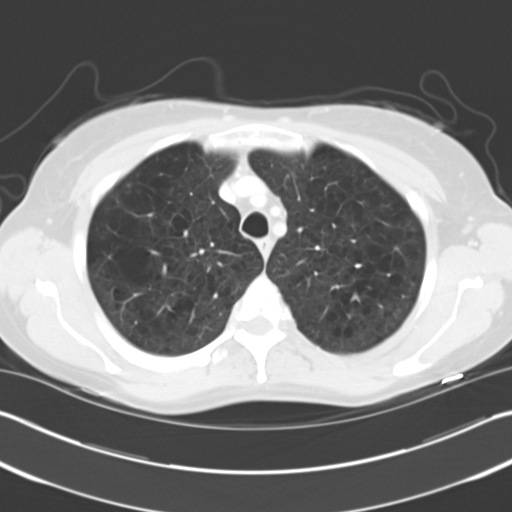
[im 66/78  lung]
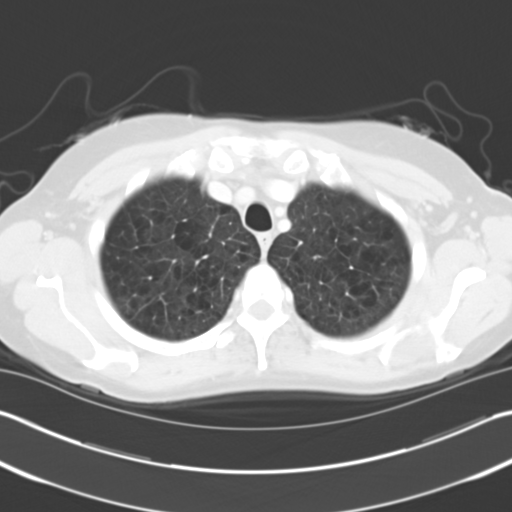
[im 72/78  lung]
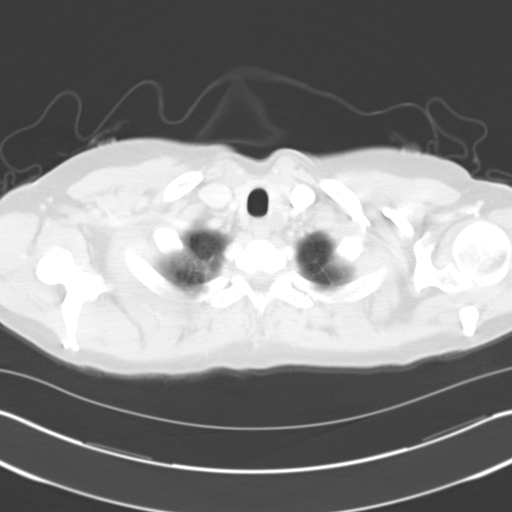

[15 of 33 positions shown; findings below may reference images not displayed]

FINDINGS: Mediastinum/Lymph Nodes: There are several nodular lesions in the
left lobe of the thyroid. There is a dominant nodular lesion in the
left lobe of the thyroid measuring 1.6 x 1.2 cm.

There is no appreciable thoracic adenopathy. There is no
demonstrable thoracic aortic aneurysm or dissection. The visualized
great vessels appear unremarkable. There is no major vessel
pulmonary embolus. There is coronary artery calcification in the
region of the proximal left anterior descending coronary artery.

Lungs/Pleura: There is underlying centrilobular emphysematous
change, also noted on prior study and essentially stable. The
previously noted 4 mm nodular opacity in the inferior aspect of the
lateral segment of the right lower lobe is stable and currently seen
on axial slice 60 series 3. No new pulmonary nodular lesions are
apparent.

Upper abdomen: Visualized upper abdominal structures are
unremarkable.

Musculoskeletal: There are no blastic or lytic bone lesions.
IMPRESSION: Stable 4 mm nodular opacity lateral right base. No new parenchymal
lung lesions. Underlying centrilobular emphysematous changes noted.

No apparent adenopathy.

Multinodular goiter with a dominant mass arising from the left lobe
inferiorly. Consider further evaluation with thyroid ultrasound. If
patient is clinically hyperthyroid, consider nuclear medicine
thyroid uptake and scan.

There is coronary artery calcification in the proximal left anterior
descending coronary artery, a finding also present previously.

## 2017-08-25 ENCOUNTER — Telehealth: Payer: Self-pay | Admitting: Family Medicine

## 2017-08-25 NOTE — Telephone Encounter (Signed)
Reason for CRM: left message asking pt to call office.  Pt has appointment 1/23 for depo inj.  Is pt going to stay with dr Dayton Martesaron or transfer to pcp here at [facility]. If pt is going to stay with dr Dayton Martesaron please cancel appointment @ [facility] and r/s nurse visit @ [place] [facility].  If pt wants to stay @ [facility] please schedule transfer appointment from dr [facility]aron [facility] and pt can keep her nurse visit for 1/23

## 2017-08-26 ENCOUNTER — Ambulatory Visit (INDEPENDENT_AMBULATORY_CARE_PROVIDER_SITE_OTHER): Payer: 59

## 2017-08-26 DIAGNOSIS — Z3042 Encounter for surveillance of injectable contraceptive: Secondary | ICD-10-CM

## 2017-08-26 MED ORDER — MEDROXYPROGESTERONE ACETATE 150 MG/ML IM SUSY
150.0000 mg | PREFILLED_SYRINGE | Freq: Once | INTRAMUSCULAR | Status: AC
  Administered 2017-08-26: 150 mg via INTRAMUSCULAR

## 2017-09-13 ENCOUNTER — Telehealth: Payer: 59 | Admitting: Family

## 2017-09-13 ENCOUNTER — Encounter: Payer: Self-pay | Admitting: Family Medicine

## 2017-09-13 DIAGNOSIS — B9689 Other specified bacterial agents as the cause of diseases classified elsewhere: Secondary | ICD-10-CM

## 2017-09-13 DIAGNOSIS — J028 Acute pharyngitis due to other specified organisms: Secondary | ICD-10-CM

## 2017-09-13 MED ORDER — BENZONATATE 100 MG PO CAPS: 100.0000 mg | ORAL_CAPSULE | Freq: Three times a day (TID) | ORAL | 0 refills | Status: DC | PRN

## 2017-09-13 MED ORDER — PREDNISONE 5 MG PO TABS: 5.0000 mg | ORAL_TABLET | ORAL | 0 refills | Status: DC

## 2017-09-13 MED ORDER — AZITHROMYCIN 250 MG PO TABS: ORAL_TABLET | ORAL | 0 refills | Status: DC

## 2017-09-13 NOTE — Progress Notes (Signed)
Thank you for the details you included in the comment boxes. Those details are very helpful in determining the best course of treatment for you and help [place] to provide the best care. The standard of care for your condition is exactly what you mentioned, a z-pack and steroid pack. We actually do a dose pack taper on the steroids which works better (30mg  day one and then going down). It will say on the bottle. See below.  We are sorry that you are not feeling well.  Here is how we plan to help!  Based on your presentation I believe you most likely have A cough due to bacteria.  When patients have a fever and a productive cough with a change in color or increased sputum production, we are concerned about bacterial bronchitis.  If left untreated it can progress to pneumonia.  If your symptoms do not improve with your treatment plan it is important that you contact your provider.   I have prescribed Azithromyin 250 mg: two tablets now and then one tablet daily for 4 additonal days    In addition you may use A non-prescription cough medication called Mucinex DM: take 2 tablets every 12 hours. and A prescription cough medication called Tessalon Perles 100mg . You may take 1-2 capsules every 8 hours as needed for your cough.  Sterapred 5 mg dosepak  From your responses in the eVisit questionnaire you describe inflammation in the upper respiratory tract which is causing a significant cough.  This is commonly called Bronchitis and has four common causes:    Allergies  Viral Infections  Acid Reflux  Bacterial Infection Allergies, viruses and acid reflux are treated by controlling symptoms or eliminating the cause. An example might be a cough caused by taking certain blood pressure medications. You stop the cough by changing the medication. Another example might be a cough caused by acid reflux. Controlling the reflux helps control the cough.  USE OF BRONCHODILATOR ("RESCUE") INHALERS: There is a risk from  using your bronchodilator too frequently.  The risk is that over-reliance on a medication which only relaxes the muscles surrounding the breathing tubes can reduce the effectiveness of medications prescribed to reduce swelling and congestion of the tubes themselves.  Although you feel brief relief from the bronchodilator inhaler, your asthma may actually be worsening with the tubes becoming more swollen and filled with mucus.  This can delay other crucial treatments, such as oral steroid medications. If you need to use a bronchodilator inhaler daily, several times per day, you should discuss this with your provider.  There are probably better treatments that could be used to keep your asthma under control.     HOME CARE . Only take medications as instructed by your medical team. . Complete the entire course of an antibiotic. . Drink plenty of fluids and get plenty of rest. . Avoid close contacts especially the very young and the elderly . Cover your mouth if you cough or cough into your sleeve. . Always remember to wash your hands . A steam or ultrasonic humidifier can help congestion.   GET HELP RIGHT AWAY IF: . You develop worsening fever. . You become short of breath . You cough up blood. . Your symptoms persist after you have completed your treatment plan MAKE SURE YOU   Understand these instructions.  Will watch your condition.  Will get help right away if you are not doing well or get worse.  Your e-visit answers were reviewed by a board  certified advanced clinical practitioner to complete your personal care plan.  Depending on the condition, your plan could have included both over the counter or prescription medications. If there is a problem please reply  once you have received a response from your provider. Your safety is important to us.  If you have drug allergies check your prescription carefully.    You can use MyChart to ask questions about today's visit, request a  non-urgent call back, or ask for a work or school excuse for 24 hours related to this e-Visit. If it has been greater than 24 hours you will need to follow up with your provider, or enter a new e-Visit to address those concerns. You will get an e-mail in the next two days asking about your experience.  I hope that your e-visit has been valuable and will speed your recovery. Thank you for using e-visits.

## 2017-09-14 ENCOUNTER — Telehealth: Payer: Self-pay

## 2017-09-14 NOTE — Telephone Encounter (Signed)
PLEASE NOTE: All timestamps contained within this report are represented as [place]. CONFIDENTIALTY NOTICE: This fax transmission is intended only for the addressee. It contains information that is legally privileged, confidential or otherwise protected from use or disclosure. If you are not the intended recipient, you are strictly prohibited from reviewing, disclosing, copying using or disseminating any of this information or taking any action in reliance on or regarding this information. If you have received this fax in error, please notify us immediately by telephone so that we can arrange for its return to us. Phone: [phone], Toll-Free: [phone], Fax: [phone] Page: 1 of 2 Call Id: [id number] [facility] Client TELEPHONE ADVICE RECORD [facility] Patient Name: Lanette HampshireAMMIE Ezekiel Gender: Female DOB: 05-08-1971 Age: 47 Y 3 M 18 D Return Phone Number: [phone] (Primary) Address: [place]: [place] [id number] Client [place] [facility] - [facility] Client Site  [facility] Physician Ruthe MannanAron, Talia- MD Contact Type Call Who Is Calling Patient / Member / Family / Caregiver Call Type Triage / Clinical Relationship To Patient Self Return Phone Number [phone] (Primary) Chief Complaint Chest Pain (non urgent symptoms) Reason for Call Symptomatic / Request for Health Information Initial Comment Caller states she has been exposed to the flu and a virus. She has green mucus and her chest feels like she has heavy pressure from congestion. GOTO Facility Not Listed [facility]. Translation No Nurse Assessment Nurse: Delford FieldWright, RN, Ivin BootyJoshua Date/Time (Eastern Time): 09/13/2017 1:38:23 PM Confirm and document reason for call. If symptomatic, describe symptoms. ---Caller states that she has green mucus and her chest feels like she has heavy pressure from  congestion. She feels that she is getting a sinus infection and bronchitis. Mucinex and her inhalers are not loosening his chest up. Does the patient have any new or worsening symptoms? ---Yes Will a triage be completed? ---Yes Related visit to physician within the last 2 weeks? ---No Does the PT have any chronic conditions? (i.e. diabetes, asthma, etc.) ---Yes List chronic conditions. ---asthma, bronchitis Is the patient pregnant or possibly pregnant? (Ask all females between the ages of 4612-55) ---No Is this a behavioral health or substance abuse call? ---No Guidelines Guideline Title Affirmed Question Affirmed Notes Nurse Date/Time Lamount Cohen(Eastern Time) Sinus Pain or Congestion Lots of coughing Ria BushWright, RN, Joshua 09/13/2017 1:41:48 PM Disp. Time Lamount Cohen(Eastern Time) Disposition Final User 09/13/2017 1:45:40 PM See PCP When Office is Open (within 3 days) Yes Delford FieldWright, RN, Ivin BootyJoshua PLEASE NOTE: All timestamps contained within this report are represented as [place] Standard Time. CONFIDENTIALTY NOTICE: This fax transmission is intended only for the addressee. It contains information that is legally privileged, confidential or otherwise protected from use or disclosure. If you are not the intended recipient, you are strictly prohibited from reviewing, disclosing, copying using or disseminating any of this information or taking any action in reliance on or regarding this information. If you have received this fax in error, please notify us immediately by telephone so that we can arrange for its return to us. Phone: [phone], Toll-Free: [phone], Fax: [phone] Page: 2 of 2 Call Id: [id number] Caller Disagree/Comply Comply Caller Understands Yes PreDisposition Call Doctor Care Advice Given Per Guideline SEE PCP WITHIN 3 DAYS: * You need to be seen within 2 or 3 days. Call your doctor during regular office hours and make an appointment. An urgent care center is often the best source of care if  your doctor's office  is closed or you can't get an appointment. NOTE: If office will be open tomorrow, tell caller to call then, not in 3 days. COUGH MEDICINES: FOR A STUFFY NOSE - USE NASAL WASHES: * Methods: There are several ways to perform nasal irrigation. You can use a saline nasal spray bottle (available over-the-counter), a rubber ear syringe, a medical syringe without the needle, or a NETI POT. NASAL DECONGESTANTS FOR A VERY STUFFY NOSE: CALL BACK IF: * Difficulty breathing (and not relieved by cleaning out nose) * You become worse. CARE ADVICE given per Sinus Pain or Congestion (Adult) guideline. After Care Instructions Given Call Event Type User Date / Time Description Education document email Ria Bush 09/13/2017 1:44:59 PM [facility] Connect Now Instructions Referrals GO TO FACILITY OTHER - SPECIFY

## 2017-09-14 NOTE — Telephone Encounter (Signed)
Noted  

## 2017-09-14 NOTE — Telephone Encounter (Signed)
Per chart review tab pt had evist on 09/13/17 and pt has appt 09/22/17 to transfer care from Dr Dayton MartesAron to Allayne GitelmanK Clark NP.

## 2017-09-22 ENCOUNTER — Ambulatory Visit: Payer: 59 | Admitting: Primary Care

## 2017-09-22 ENCOUNTER — Encounter: Payer: Self-pay | Admitting: Primary Care

## 2017-09-22 VITALS — BP 106/70 | HR 87 | Temp 98.0°F | Ht 68.0 in | Wt 142.8 lb

## 2017-09-22 DIAGNOSIS — J209 Acute bronchitis, unspecified: Secondary | ICD-10-CM | POA: Diagnosis not present

## 2017-09-22 DIAGNOSIS — R911 Solitary pulmonary nodule: Secondary | ICD-10-CM | POA: Diagnosis not present

## 2017-09-22 DIAGNOSIS — J432 Centrilobular emphysema: Secondary | ICD-10-CM

## 2017-09-22 DIAGNOSIS — E041 Nontoxic single thyroid nodule: Secondary | ICD-10-CM

## 2017-09-22 DIAGNOSIS — Z308 Encounter for other contraceptive management: Secondary | ICD-10-CM | POA: Diagnosis not present

## 2017-09-22 DIAGNOSIS — F17200 Nicotine dependence, unspecified, uncomplicated: Secondary | ICD-10-CM

## 2017-09-22 DIAGNOSIS — I25118 Atherosclerotic heart disease of native coronary artery with other forms of angina pectoris: Secondary | ICD-10-CM

## 2017-09-22 LAB — CBC WITH DIFFERENTIAL/PLATELET
BASOS PCT: 0.7 % (ref 0.0–3.0)
Basophils Absolute: 0.1 10*3/uL (ref 0.0–0.1)
EOS PCT: 0.6 % (ref 0.0–5.0)
Eosinophils Absolute: 0.1 10*3/uL (ref 0.0–0.7)
HCT: 44.8 % (ref 36.0–46.0)
Hemoglobin: 15.1 g/dL — ABNORMAL HIGH (ref 12.0–15.0)
LYMPHS ABS: 3.6 10*3/uL (ref 0.7–4.0)
Lymphocytes Relative: 24.5 % (ref 12.0–46.0)
MCHC: 33.7 g/dL (ref 30.0–36.0)
MCV: 94.3 fl (ref 78.0–100.0)
Monocytes Absolute: 1 10*3/uL (ref 0.1–1.0)
Monocytes Relative: 6.7 % (ref 3.0–12.0)
NEUTROS PCT: 67.5 % (ref 43.0–77.0)
Neutro Abs: 9.9 10*3/uL — ABNORMAL HIGH (ref 1.4–7.7)
Platelets: 436 10*3/uL — ABNORMAL HIGH (ref 150.0–400.0)
RBC: 4.75 Mil/uL (ref 3.87–5.11)
RDW: 13.2 % (ref 11.5–15.5)
WBC: 14.7 10*3/uL — ABNORMAL HIGH (ref 4.0–10.5)

## 2017-09-22 LAB — COMPREHENSIVE METABOLIC PANEL
ALBUMIN: 4 g/dL (ref 3.5–5.2)
ALK PHOS: 52 U/L (ref 39–117)
ALT: 19 U/L (ref 0–35)
AST: 17 U/L (ref 0–37)
BUN: 12 mg/dL (ref 6–23)
CO2: 27 mEq/L (ref 19–32)
Calcium: 10 mg/dL (ref 8.4–10.5)
Chloride: 105 mEq/L (ref 96–112)
Creatinine, Ser: 0.79 mg/dL (ref 0.40–1.20)
GFR: 83.53 mL/min (ref >60.00)
Glucose, Bld: 85 mg/dL (ref 70–99)
POTASSIUM: 4.5 meq/L (ref 3.5–5.1)
Sodium: 137 mEq/L (ref 135–145)
Total Bilirubin: 0.5 mg/dL (ref 0.2–1.2)
Total Protein: 7.1 g/dL (ref 6.0–8.3)

## 2017-09-22 LAB — LIPID PANEL
CHOLESTEROL: 123 mg/dL (ref 0–200)
HDL: 45.5 mg/dL (ref >39.00)
LDL Cholesterol: 62 mg/dL (ref 0–99)
NonHDL: 77.67
TRIGLYCERIDES: 76 mg/dL (ref 0.0–149.0)
Total CHOL/HDL Ratio: 3
VLDL: 15.2 mg/dL (ref 0.0–40.0)

## 2017-09-22 NOTE — Assessment & Plan Note (Signed)
Smoking 1.5 PPD, discouraged use.

## 2017-09-22 NOTE — Progress Notes (Signed)
Subjective:    Patient ID: Makayla Green, female    DOB: 1971-05-19, 47 y.o.   MRN: [id number]  HPI  Ms. Makayla Green is a 47 year old female who presents today to transfer care from Dr. Dayton MartesAron.  1) Emphysema: Currently managed on Symbicort 160-4.5 mcg for which she takes during sick periods, not daily. She has an albuterol inhaler for which she doesn't typically use. Treated for acute bronchitis on 09/13/17 with azithromycin and prednisone pack through an e-visit. States that it typically takes "two rounds of antibiotics" to clear the infection, get's this annually. She is a current smoker and smokes to 1.5 PPD. History of pulmonary nodule which has been followed in the past and was determined to be stable.   2) Hyperlipidemia/CAD: Currently managed on rosuvastatin, aspirin, isosorbide mononitrate 30 mg. Currently following with cardiology annually. She is compliant to her medications.   3) Thyroid Nodule: Diagnosed in 2017, following with endocrinology annually. History of FNA biopsy in 2017 which was negative.   Review of Systems  Constitutional: Negative for fever.  Eyes: Negative for visual disturbance.  Respiratory: Negative for cough and shortness of breath.   Cardiovascular: Negative for chest pain.  Musculoskeletal: Negative for myalgias.  Neurological: Negative for dizziness and headaches.       Past Medical History:  Diagnosis Date  . Abnormal pap    LEEP 2005  . Asthma   . Coronary artery disease   . Other general counseling and advice for contraceptive management   . Routine general medical examination at a health care facility   . Screening for ischemic heart disease   . Tobacco use disorder      Social History   Socioeconomic History  . Marital status: Married    Spouse name: Not on file  . Number of children: 3  . Years of education: Not on file  . Highest education level: Not on file  Social Needs  . Financial resource strain: Not on file  . Food  insecurity - worry: Not on file  . Food insecurity - inability: Not on file  . Transportation needs - medical: Not on file  . Transportation needs - non-medical: Not on file  Occupational History  . Not on file  Tobacco Use  . Smoking status: Current Every Day Smoker    Packs/day: 1.00    Years: 25.00    Pack years: 25.00    Types: Cigarettes  . Smokeless tobacco: Never Used  Substance and Sexual Activity  . Alcohol use: No    Alcohol/week: 0.0 oz  . Drug use: No  . Sexual activity: Not on file  Other Topics Concern  . Not on file  Social History Narrative   Married.   3 children, 1 grandchild.   Works in [employer].    Enjoys spending time with family.    Past Surgical History:  Procedure Laterality Date  . CARDIAC CATHETERIZATION N/A 11/14/2015   Procedure: Left Heart Cath and Coronary Angiography;  Surgeon: Iran OuchMuhammad A Arida, MD;  Location: [facility];  Service: Cardiovascular;  Laterality: N/A;    Family History  Problem Relation Age of Onset  . COPD Mother   . Heart disease Mother   . Neuropathy Father   . Thyroid disease Neg Hx     Allergies  Allergen Reactions  . Penicillins Shortness Of Breath and Swelling    Has patient had a PCN reaction causing immediate rash, facial/tongue/throat swelling, SOB or lightheadedness with hypotension: Yes  Has patient had a PCN reaction causing severe rash involving mucus membranes or skin necrosis: No Has patient had a PCN reaction that required hospitalization No Has patient had a PCN reaction occurring within the last 10 years: No If all of the above answers are "NO", then may proceed with Cephalosporin use.   . Amitriptyline     Hallucinations  . Doxycycline   . Shellfish Allergy     Patient has not eaten any shellfish and does not know if she is allergic. She has had radiology images using dye with no reactions noted.     Current Outpatient Medications on File Prior to Visit  Medication Sig Dispense Refill    . aspirin EC 81 MG tablet Take 1 tablet (81 mg total) by mouth daily. 90 tablet 3  . fluticasone (FLONASE) 50 MCG/ACT nasal spray Place 2 sprays into both nostrils daily. 16 g 6  . isosorbide mononitrate (IMDUR) 30 MG 24 hr tablet Take 0.5 tablets (15 mg total) by mouth daily. 45 tablet 11  . rosuvastatin (CRESTOR) 5 MG tablet Take 1 tablet (5 mg total) by mouth daily. 30 tablet 11  . SYMBICORT 160-4.5 MCG/ACT inhaler INHALE 2 PUFFS BY MOUTH DAILY 10.2 g 0  . fexofenadine-pseudoephedrine (ALLEGRA-D 24) 180-240 MG per 24 hr tablet Take 1 tablet by mouth daily. (Patient not taking: Reported on 09/22/2017) 30 tablet 3   Current Facility-Administered Medications on File Prior to Visit  Medication Dose Route Frequency Provider Last Rate Last Dose  . medroxyPROGESTERone (DEPO-PROVERA) injection 150 mg  150 mg Intramuscular Q90 days Dianne Dun, MD   150 mg at 12/25/16 1609    BP 106/70   Pulse 87   Temp 98 F (36.7 C) (Oral)   Ht 5\' 8"  (1.727 m)   Wt 142 lb 12 oz (64.8 kg)   SpO2 98%   BMI 21.71 kg/m    Objective:   Physical Exam  Constitutional: She appears well-nourished. She does not appear ill.  HENT:  Right Ear: Tympanic membrane and ear canal normal.  Left Ear: Tympanic membrane and ear canal normal.  Nose: Right sinus exhibits no maxillary sinus tenderness and no frontal sinus tenderness. Left sinus exhibits no maxillary sinus tenderness and no frontal sinus tenderness.  Mouth/Throat: Oropharynx is clear and moist.  Eyes: Conjunctivae are normal.  Neck: Neck supple.  Cardiovascular: Normal rate and regular rhythm.  Pulmonary/Chest: Effort normal and breath sounds normal. She has no wheezes. She has no rales.  Lymphadenopathy:    She has no cervical adenopathy.  Skin: Skin is warm and dry.          Assessment & Plan:

## 2017-09-22 NOTE — Assessment & Plan Note (Signed)
Determined stable from CT exam on 2017.

## 2017-09-22 NOTE — Assessment & Plan Note (Signed)
Following with cardiology, continue rosuvastatin, aspirin, Imdur. Check lipids today.

## 2017-09-22 NOTE — Assessment & Plan Note (Signed)
Receives Depo Provera injections.

## 2017-09-22 NOTE — Assessment & Plan Note (Signed)
Following with endocrinology, FNA in 2017 unremarkable.

## 2017-09-22 NOTE — Patient Instructions (Signed)
Stop by the lab prior to leaving today. I will notify you of your results once received.   Follow up with endocrinology and cardiology as scheduled.   It was a pleasure meeting you!

## 2017-09-22 NOTE — Assessment & Plan Note (Addendum)
Uses Symbicort PRN during sick periods, never uses albuterol. Discussed that albuterol is used for short term increases in symptoms, such as during sick periods. Discussed that Symbicort is used daily. She states that she will continue to use the inhalers as she's been doing as this is what helps with symptoms.   No wheezing on exam. Continue to monitor.

## 2017-09-23 MED ORDER — AZITHROMYCIN 250 MG PO TABS: ORAL_TABLET | ORAL | 0 refills | Status: DC

## 2017-11-11 ENCOUNTER — Ambulatory Visit (INDEPENDENT_AMBULATORY_CARE_PROVIDER_SITE_OTHER): Payer: 59

## 2017-11-11 DIAGNOSIS — Z3042 Encounter for surveillance of injectable contraceptive: Secondary | ICD-10-CM

## 2017-11-11 MED ORDER — MEDROXYPROGESTERONE ACETATE 150 MG/ML IM SUSY
150.0000 mg | PREFILLED_SYRINGE | Freq: Once | INTRAMUSCULAR | Status: AC
  Administered 2017-11-11: 150 mg via INTRAMUSCULAR

## 2018-01-27 ENCOUNTER — Ambulatory Visit: Payer: Self-pay

## 2018-02-09 ENCOUNTER — Ambulatory Visit (INDEPENDENT_AMBULATORY_CARE_PROVIDER_SITE_OTHER): Payer: 59 | Admitting: Emergency Medicine

## 2018-02-09 ENCOUNTER — Encounter (INDEPENDENT_AMBULATORY_CARE_PROVIDER_SITE_OTHER): Payer: Self-pay

## 2018-02-09 DIAGNOSIS — Z789 Other specified health status: Secondary | ICD-10-CM

## 2018-02-09 DIAGNOSIS — IMO0001 Reserved for inherently not codable concepts without codable children: Secondary | ICD-10-CM

## 2018-02-09 MED ORDER — MEDROXYPROGESTERONE ACETATE 150 MG/ML IM SUSP
150.0000 mg | Freq: Once | INTRAMUSCULAR | Status: AC
  Administered 2018-02-09: 150 mg via INTRAMUSCULAR

## 2018-02-09 NOTE — Progress Notes (Signed)
Per orders of Burtis JunesKat Clark , injection of depo provera  given by Zollie PeeSierra Odessie Polzin. Patient tolerated injection well. Right upper outter quad.

## 2018-02-24 ENCOUNTER — Ambulatory Visit: Payer: 59 | Admitting: Endocrinology

## 2018-03-25 DIAGNOSIS — H53123 Transient visual loss, bilateral: Secondary | ICD-10-CM | POA: Diagnosis not present

## 2018-03-25 DIAGNOSIS — H40013 Open angle with borderline findings, low risk, bilateral: Secondary | ICD-10-CM | POA: Diagnosis not present

## 2018-04-07 ENCOUNTER — Telehealth: Payer: 59 | Admitting: Nurse Practitioner

## 2018-04-07 DIAGNOSIS — R059 Cough, unspecified: Secondary | ICD-10-CM

## 2018-04-07 DIAGNOSIS — R05 Cough: Secondary | ICD-10-CM

## 2018-04-07 MED ORDER — AZITHROMYCIN 250 MG PO TABS: ORAL_TABLET | ORAL | 0 refills | Status: DC

## 2018-04-07 MED ORDER — PREDNISONE 20 MG PO TABS: ORAL_TABLET | ORAL | 0 refills | Status: DC

## 2018-04-07 NOTE — Progress Notes (Signed)
We are sorry that you are not feeling well.  Here is how we plan to help!  Based on your presentation I believe you most likely have A cough due to bacteria.  When patients have a fever and a productive cough with a change in color or increased sputum production, we are concerned about bacterial bronchitis.  If left untreated it can progress to pneumonia.  If your symptoms do not improve with your treatment plan it is important that you contact your provider.   I have prescribed Azithromyin 250 mg: two tablets now and then one tablet daily for 4 additonal days    In addition you may use A non-prescription cough medication called Mucinex DM: take 2 tablets every 12 hours.  Prednisone 20mg  2 tablets at same time daily for 5 days  From your responses in the eVisit questionnaire you describe inflammation in the upper respiratory tract which is causing a significant cough.  This is commonly called Bronchitis and has four common causes:    Allergies  Viral Infections  Acid Reflux  Bacterial Infection Allergies, viruses and acid reflux are treated by controlling symptoms or eliminating the cause. An example might be a cough caused by taking certain blood pressure medications. You stop the cough by changing the medication. Another example might be a cough caused by acid reflux. Controlling the reflux helps control the cough.  USE OF BRONCHODILATOR ("RESCUE") INHALERS: There is a risk from using your bronchodilator too frequently.  The risk is that over-reliance on a medication which only relaxes the muscles surrounding the breathing tubes can reduce the effectiveness of medications prescribed to reduce swelling and congestion of the tubes themselves.  Although you feel brief relief from the bronchodilator inhaler, your asthma may actually be worsening with the tubes becoming more swollen and filled with mucus.  This can delay other crucial treatments, such as oral steroid medications. If you need to use  a bronchodilator inhaler daily, several times per day, you should discuss this with your provider.  There are probably better treatments that could be used to keep your asthma under control.     HOME CARE . Only take medications as instructed by your medical team. . Complete the entire course of an antibiotic. . Drink plenty of fluids and get plenty of rest. . Avoid close contacts especially the very young and the elderly . Cover your mouth if you cough or cough into your sleeve. . Always remember to wash your hands . A steam or ultrasonic humidifier can help congestion.   GET HELP RIGHT AWAY IF: . You develop worsening fever. . You become short of breath . You cough up blood. . Your symptoms persist after you have completed your treatment plan MAKE SURE YOU   Understand these instructions.  Will watch your condition.  Will get help right away if you are not doing well or get worse.  Your e-visit answers were reviewed by a board certified advanced clinical practitioner to complete your personal care plan.  Depending on the condition, your plan could have included both over the counter or prescription medications. If there is a problem please reply  once you have received a response from your provider. Your safety is important to [place].  If you have drug allergies check your prescription carefully.    You can use MyChart to ask questions about today's visit, request a non-urgent call back, or ask for a work or school excuse for 24 hours related to this e-Visit. If  it has been greater than 24 hours you will need to follow up with your provider, or enter a new e-Visit to address those concerns. You will get an e-mail in the next two days asking about your experience.  I hope that your e-visit has been valuable and will speed your recovery. Thank you for using e-visits.   

## 2018-05-04 ENCOUNTER — Ambulatory Visit (INDEPENDENT_AMBULATORY_CARE_PROVIDER_SITE_OTHER): Payer: 59 | Admitting: Emergency Medicine

## 2018-05-04 DIAGNOSIS — Z3049 Encounter for surveillance of other contraceptives: Secondary | ICD-10-CM

## 2018-05-04 MED ORDER — MEDROXYPROGESTERONE ACETATE 150 MG/ML IM SUSP
150.0000 mg | Freq: Once | INTRAMUSCULAR | Status: AC
  Administered 2018-05-04: 150 mg via INTRAMUSCULAR

## 2018-05-04 NOTE — Progress Notes (Signed)
Per orders of Mayra Reel , injection of depo provera given by Zollie Pee. Patient tolerated injection well. Patient received in the left upper outer quad.

## 2018-05-22 ENCOUNTER — Other Ambulatory Visit: Payer: Self-pay | Admitting: Cardiovascular Disease

## 2018-05-23 ENCOUNTER — Other Ambulatory Visit: Payer: Self-pay | Admitting: Cardiovascular Disease

## 2018-06-29 ENCOUNTER — Other Ambulatory Visit: Payer: Self-pay | Admitting: Cardiovascular Disease

## 2018-07-13 ENCOUNTER — Other Ambulatory Visit: Payer: Self-pay | Admitting: Cardiovascular Disease

## 2018-08-13 ENCOUNTER — Other Ambulatory Visit: Payer: Self-pay | Admitting: Cardiovascular Disease

## 2018-08-16 ENCOUNTER — Other Ambulatory Visit: Payer: Self-pay | Admitting: Cardiovascular Disease

## 2018-08-26 ENCOUNTER — Ambulatory Visit (INDEPENDENT_AMBULATORY_CARE_PROVIDER_SITE_OTHER): Payer: 59

## 2018-08-26 DIAGNOSIS — Z308 Encounter for other contraceptive management: Secondary | ICD-10-CM

## 2018-08-26 LAB — POCT URINE PREGNANCY: PREG TEST UR: NEGATIVE

## 2018-08-26 NOTE — Progress Notes (Signed)
Pt out of window for Depo-Provera; last inj, 05/04/2018 (due: 12/17- 08/03/2018). Will repeat HCG in 1 wk.

## 2018-08-31 ENCOUNTER — Other Ambulatory Visit: Payer: Self-pay | Admitting: Cardiovascular Disease

## 2018-08-31 MED ORDER — ISOSORBIDE MONONITRATE ER 30 MG PO TB24: 15.0000 mg | ORAL_TABLET | Freq: Every day | ORAL | 0 refills | Status: DC

## 2018-08-31 NOTE — Telephone Encounter (Signed)
 *  STAT* If patient is at the pharmacy, call can be transferred to refill team.   1. Which medications need to be refilled? (please list name of each medication and dose if known) isosorbide mononitrate (IMDUR) 30 MG 24 hr tablet  2. Which pharmacy/location (including street and city if local pharmacy) is medication to be sent to? [facility] [place]  3. Do they need a 30 day or 90 day supply? 30  Patient has appt 09/24/18 with Azalee Course

## 2018-08-31 NOTE — Telephone Encounter (Signed)
Rx(s) sent to pharmacy electronically.  

## 2018-09-01 ENCOUNTER — Ambulatory Visit: Payer: 59 | Admitting: Cardiovascular Disease

## 2018-09-02 ENCOUNTER — Ambulatory Visit (INDEPENDENT_AMBULATORY_CARE_PROVIDER_SITE_OTHER): Payer: 59

## 2018-09-02 DIAGNOSIS — Z308 Encounter for other contraceptive management: Secondary | ICD-10-CM | POA: Diagnosis not present

## 2018-09-02 DIAGNOSIS — Z3042 Encounter for surveillance of injectable contraceptive: Secondary | ICD-10-CM

## 2018-09-02 LAB — POCT URINE PREGNANCY: PREG TEST UR: NEGATIVE

## 2018-09-02 MED ORDER — MEDROXYPROGESTERONE ACETATE 150 MG/ML IM SUSP
150.0000 mg | Freq: Once | INTRAMUSCULAR | Status: AC
  Administered 2018-09-02: 150 mg via INTRAMUSCULAR

## 2018-09-02 NOTE — Progress Notes (Signed)
Per orders of Katherine Clark, NP, injection of Depo-Provera given by Syona Wroblewski. Patient tolerated injection well.  

## 2018-09-14 ENCOUNTER — Other Ambulatory Visit: Payer: Self-pay | Admitting: Family Medicine

## 2018-09-14 DIAGNOSIS — J302 Other seasonal allergic rhinitis: Secondary | ICD-10-CM

## 2018-09-15 NOTE — Telephone Encounter (Signed)
Patient is due for CPE, please schedule at her convenience. She will need this before I can send any additional refills of her medications.

## 2018-09-16 NOTE — Telephone Encounter (Signed)
Attempted to reach patient. No answer and voicemail is full. °

## 2018-09-21 NOTE — Telephone Encounter (Signed)
Attempted to reach patient. No answer and voicemail is full. °

## 2018-09-24 ENCOUNTER — Ambulatory Visit: Payer: 59 | Admitting: Cardiology

## 2018-09-24 ENCOUNTER — Encounter: Payer: Self-pay | Admitting: Physician Assistant

## 2018-09-24 VITALS — BP 116/54 | HR 84 | Ht 68.0 in | Wt 148.8 lb

## 2018-09-24 DIAGNOSIS — I25118 Atherosclerotic heart disease of native coronary artery with other forms of angina pectoris: Secondary | ICD-10-CM

## 2018-09-24 DIAGNOSIS — R0789 Other chest pain: Secondary | ICD-10-CM

## 2018-09-24 DIAGNOSIS — F17200 Nicotine dependence, unspecified, uncomplicated: Secondary | ICD-10-CM

## 2018-09-24 DIAGNOSIS — E782 Mixed hyperlipidemia: Secondary | ICD-10-CM | POA: Diagnosis not present

## 2018-09-24 MED ORDER — ISOSORBIDE MONONITRATE ER 30 MG PO TB24: 15.0000 mg | ORAL_TABLET | Freq: Every day | ORAL | 6 refills | Status: DC

## 2018-09-24 MED ORDER — ROSUVASTATIN CALCIUM 5 MG PO TABS: 5.0000 mg | ORAL_TABLET | Freq: Every day | ORAL | 6 refills | Status: DC

## 2018-09-24 NOTE — Patient Instructions (Addendum)
Medication Instructions:  Your physician recommends that you continue on your current medications as directed. Please refer to the Current Medication list given to you today. If you need a refill on your cardiac medications before your next appointment, please call your pharmacy.   Lab work: Your physician recommends that you return for lab work in: LIPID, LFT, BMET, CBC complete within the next week If you have labs (blood work) drawn today and your tests are completely normal, you will receive your results only by: Marland Kitchen MyChart Message (if you have MyChart) OR . A paper copy in the mail If you have any lab test that is abnormal or we need to change your treatment, we will call you to review the results.  Testing/Procedures: NONE   Follow-Up: At [facility], you and your health needs are our priority.  As part of our continuing mission to provide you with exceptional heart care, we have created designated Provider Care Teams.  These Care Teams include your primary Cardiologist (physician) and Advanced Practice Providers (APPs -  Physician Assistants and Nurse Practitioners) who all work together to provide you with the care you need, when you need it. You will need a follow up appointment in 12 months.  Please call our office 2 months in advance to schedule this appointment.  You may see Dr Nicki Guadalajara or one of the following Advanced Practice Providers on your designated Care Team: [place], [place] . Micah Flesher, PA-C  Any Other Special Instructions Will Be Listed Below (If Applicable).

## 2018-09-24 NOTE — Progress Notes (Signed)
Cardiology Office Note   Date:  09/24/2018   ID:  Makayla Green, DOB May 11, 1971, MRN [id number]  PCP:  Doreene Nestlark, Katherine K, NP  Cardiologist: Dr. Royann Shiversroitoru, MD   Chief Complaint  Patient presents with  . Follow-up    History of Present Illness: Makayla Green is a 48 y.o. female who presents for annual cardiology visit, seen for Dr. Royann Shiversroitoru.   Makayla Green has a prior hx of atypical chest discomfort and incidentally discovered coronary calcification on CT of the chest, leading to cardiac catheterization performed 11/2015 by Dr. Kirke CorinArida, which showed minor (10%) plaque in the proximal LAD artery, corresponding to the calcium deposits seen on CT of the chest. Treatment with isosorbide led to the resolution of her chest discomfort which is actually a left scapular burning sensation.  Since being seen last by her primary cardiologist in 03/2017, she denies recurrent anginal symptoms and has been doing very well since that time. She has continued to take Imdur without complication.  She does continues to smoke, now up to 1.5 packs per day with no real intention on quitting. Her father is being treated for lung cancer with chemo and radiation. She has previously tried smoking cessation with Chantix and Wellbutrin which caused a lot of problems with anger and emotional instability. She gets a rash from the nicotine patches available commercially. She tried switching to a vape device, but this led to worsening asthma and she was actually advised by her pulmonary specialist to stop using it since it was worse than cigarettes. She is asking about a nicotine inhaler and I will check on that foe her if available. It was offered to her father under his treatment and she seems interested.   Past Medical History:  Diagnosis Date  . Abnormal pap    LEEP 2005  . Asthma   . Coronary artery disease   . Other general counseling and advice for contraceptive management   . Routine general  medical examination at a health care facility   . Screening for ischemic heart disease   . Tobacco use disorder     Past Surgical History:  Procedure Laterality Date  . CARDIAC CATHETERIZATION N/A 11/14/2015   Procedure: Left Heart Cath and Coronary Angiography;  Surgeon: Iran OuchMuhammad A Arida, MD;  Location: [facility];  Service: Cardiovascular;  Laterality: N/A;    Current Outpatient Medications  Medication Sig Dispense Refill  . albuterol (PROVENTIL HFA;VENTOLIN HFA) 108 (90 Base) MCG/ACT inhaler Inhale into the lungs. Every 6 hours as needed    . aspirin EC 81 MG tablet Take 1 tablet (81 mg total) by mouth daily. 90 tablet 3  . isosorbide mononitrate (IMDUR) 30 MG 24 hr tablet Take 0.5 tablets (15 mg total) by mouth daily. 30 tablet 6  . rosuvastatin (CRESTOR) 5 MG tablet Take 1 tablet (5 mg total) by mouth daily at 6 PM. 30 tablet 6  . SYMBICORT 160-4.5 MCG/ACT inhaler INHALE 2 PUFFS BY MOUTH DAILY 10.2 g 0   Current Facility-Administered Medications  Medication Dose Route Frequency Provider Last Rate Last Dose  . medroxyPROGESTERone (DEPO-PROVERA) injection 150 mg  150 mg Intramuscular Q90 days Dianne DunAron, Talia M, MD   150 mg at 12/25/16 1609    Allergies:   Penicillins; Amitriptyline; Doxycycline; and Shellfish allergy    Social History:  The patient  reports that she has been smoking cigarettes. She has a 25.00 pack-year smoking history. She has never used smokeless tobacco. She reports that she  does not drink alcohol or use drugs.   Family History:  The patient's family history includes COPD in her mother; Heart disease in her mother; Neuropathy in her father.    ROS:  Please see the history of present illness. Otherwise, review of systems are positive for none. All other systems are reviewed and negative.    PHYSICAL EXAM: VS:  BP (!) 116/54   Pulse 84   Ht 5\' 8"  (1.727 m)   Wt 148 lb 12.8 oz (67.5 kg)   SpO2 98%   BMI 22.62 kg/m  , BMI Body mass index is 22.62 kg/m.    General: Well developed, well nourished, NAD Skin: Warm, dry, intact  Head: Normocephalic, atraumatic, clear, moist mucus membranes. Neck: Negative for carotid bruits. No JVD Lungs:Clear to ausculation bilaterally. No wheezes, rales, or rhonchi. Breathing is unlabored. Cardiovascular: RRR with S1 S2. No murmurs, rubs, gallops, or LV heave appreciated. MSK: Strength and tone appear normal for age. 5/5 in all extremities Extremities: No edema. No clubbing or cyanosis. DP/PT pulses 2+ bilaterally Neuro: Alert and oriented. No focal deficits. No facial asymmetry. MAE spontaneously. Psych: Responds to questions appropriately with normal affect.     EKG:  EKG is ordered today. The ekg ordered today demonstrates NSR with no acute changes, similar to prior tracings   Recent Labs: No results found for requested labs within last 8760 hours.   Lipid Panel    Component Value Date/Time   CHOL 123 09/22/2017 0846   TRIG 76.0 09/22/2017 0846   HDL 45.50 09/22/2017 0846   CHOLHDL 3 09/22/2017 0846   VLDL 15.2 09/22/2017 0846   LDLCALC 62 09/22/2017 0846     Wt Readings from Last 3 Encounters:  09/24/18 148 lb 12.8 oz (67.5 kg)  09/22/17 142 lb 12 oz (64.8 kg)  03/19/17 150 lb (68 kg)    Other studies Reviewed: Additional studies/ records that were reviewed today include:   Cardiac catheterization 11/14/2015:  Ost LAD to Prox LAD lesion, 10% stenosed.  The left ventricular systolic function is normal.   1. Mildly calcified ostial LAD with no evidence of obstructive disease. 2. Normal LV systolic function.  Recommendations: Medical therapy of risk factors.  ASSESSMENT AND PLAN:  1. CAD: -Pt with significant coronary calcification on chest CT with relatively normal coronary arteries per cath in 2017 -Imdur added to regimen with no recurrent symptoms since last office visit  -Continue low dose nitrates for chest pain and ASA and Crestor for risk reduction  -Will obtain BMET.  Lipid panel, LFT's and CBC for follow up  -Encouraged smoking cessation as this is her largest risk factor for progressive CAD at the moment   2. Tobacco Use: -Continues to smoke, now up to 1.5 packs per day with no real intention on quitting.  -Her father is being treated for lung cancer with chemo and radiation and she has previously tried smoking cessation with Chantix and Wellbutrin which caused a lot of problems with anger and emotional instability.  -She is asking about a nicotine inhaler and I will check on that foe her if available. It was offered to her father under his treatment and she seems interested.    Current medicines are reviewed at length with the patient today.  The patient does not have concerns regarding medicines.  The following changes have been made:  no change  Labs/ tests ordered today include: BMET, Lipid panel, LFT's, CBC   Orders Placed This Encounter  Procedures  . Lipid  panel  . Hepatic function panel  . Basic Metabolic Panel (BMET)  . CBC  . EKG 12-Lead    Disposition:   FU with Dr. Royann Shivers in 1 year    Signed, Georgie Chard, NP  09/24/2018 4:20 PM    [facility] [address] [place], [place], [place]  [address] Phone: [phone]; Fax: [phone]

## 2018-10-22 ENCOUNTER — Other Ambulatory Visit: Payer: Self-pay | Admitting: Primary Care

## 2018-10-22 DIAGNOSIS — J302 Other seasonal allergic rhinitis: Secondary | ICD-10-CM

## 2018-11-24 ENCOUNTER — Other Ambulatory Visit: Payer: Self-pay

## 2018-11-24 ENCOUNTER — Ambulatory Visit (INDEPENDENT_AMBULATORY_CARE_PROVIDER_SITE_OTHER): Payer: 59 | Admitting: *Deleted

## 2018-11-24 DIAGNOSIS — Z3042 Encounter for surveillance of injectable contraceptive: Secondary | ICD-10-CM | POA: Diagnosis not present

## 2018-11-24 MED ORDER — MEDROXYPROGESTERONE ACETATE 150 MG/ML IM SUSP
150.0000 mg | Freq: Once | INTRAMUSCULAR | Status: AC
  Administered 2018-11-24: 09:00:00 150 mg via INTRAMUSCULAR

## 2018-11-24 NOTE — Progress Notes (Signed)
Per orders of Mayra Reel, injection of Depo Provera given by Ileana Ladd. Patient tolerated injection well.

## 2018-11-27 ENCOUNTER — Other Ambulatory Visit: Payer: Self-pay | Admitting: Primary Care

## 2018-11-27 DIAGNOSIS — J302 Other seasonal allergic rhinitis: Secondary | ICD-10-CM

## 2018-12-02 ENCOUNTER — Telehealth: Payer: 59 | Admitting: Physician Assistant

## 2018-12-02 DIAGNOSIS — B9789 Other viral agents as the cause of diseases classified elsewhere: Principal | ICD-10-CM

## 2018-12-02 DIAGNOSIS — J069 Acute upper respiratory infection, unspecified: Secondary | ICD-10-CM

## 2018-12-02 MED ORDER — PREDNISONE 10 MG PO TABS: 10.0000 mg | ORAL_TABLET | Freq: Every day | ORAL | 0 refills | Status: DC

## 2018-12-02 MED ORDER — BENZONATATE 100 MG PO CAPS: 100.0000 mg | ORAL_CAPSULE | Freq: Three times a day (TID) | ORAL | 0 refills | Status: DC | PRN

## 2018-12-02 NOTE — Progress Notes (Signed)

## 2018-12-02 NOTE — Progress Notes (Signed)
I have spent 5 minutes in review of e-visit questionnaire, review and updating patient chart, medical decision making and response to patient.   Alynna Hargrove Cody Jaedan Huttner, PA-C    

## 2018-12-02 NOTE — Addendum Note (Signed)
Addended by: Waldon Merl on: 12/02/2018 04:55 PM   Modules accepted: Orders

## 2018-12-06 NOTE — Telephone Encounter (Signed)
Makayla Green, will you please schedule for a virtual visit?

## 2018-12-07 ENCOUNTER — Encounter: Payer: Self-pay | Admitting: Primary Care

## 2018-12-07 ENCOUNTER — Ambulatory Visit (INDEPENDENT_AMBULATORY_CARE_PROVIDER_SITE_OTHER): Payer: 59 | Admitting: Primary Care

## 2018-12-07 DIAGNOSIS — F17200 Nicotine dependence, unspecified, uncomplicated: Secondary | ICD-10-CM | POA: Diagnosis not present

## 2018-12-07 MED ORDER — NICOTINE 10 MG IN INHA: 1.0000 | RESPIRATORY_TRACT | 0 refills | Status: DC | PRN

## 2018-12-07 NOTE — Assessment & Plan Note (Signed)
Ready to quit. Skin irritation to nicotine patches, cannot tolerate Chantix or Wellbutrin. Rx for nicotrol inhaler sent to pharmacy. Discussed potential side effects.  She will update in 2 weeks.

## 2018-12-07 NOTE — Progress Notes (Signed)
Subjective:    Patient ID: Makayla Green, female    DOB: [birth year], 48 y.o.   MRN: [id number]  HPI  Virtual Visit via Video Note  I connected with Makayla Green on 12/07/18 at 10:40 AM EDT by a video enabled telemedicine application and verified that I am speaking with the correct person using two identifiers.  Location: Patient: Home Provider: Office   I discussed the limitations of evaluation and management by telemedicine and the availability of in person appointments. The patient expressed understanding and agreed to proceed.  History of Present Illness:  Makayla Green is a 48 year old female with a history of tobacco abuse who presents today to discuss tobacco cessation.   She has smoked cigarettes for the last 30 years and is currently smoking two packs per day. She has tried OTC patches and has an allergy. She has tried Chantix and Wellbutrin in the past which has caused agitation.   She is requesting a prescription for the nicotrol inhaler. She's never used this before but is very familiar as her father was using this before his death. She is very motivated to quit.   Observations/Objective:  Alert and oriented. Appears well, not sickly. No distress. Speaking in complete sentences. No cough.  Assessment and Plan:  See problem based charting.  Follow Up Instructions:  Use the nicotrol daily as discussed.  Please update me in 2 weeks.  It was a pleasure to see you today!    I discussed the assessment and treatment plan with the patient. The patient was provided an opportunity to ask questions and all were answered. The patient agreed with the plan and demonstrated an understanding of the instructions.   The patient was advised to call back or seek an in-person evaluation if the symptoms worsen or if the condition fails to improve as anticipated.     Doreene Nest, NP    Review of Systems  Constitutional: Negative for unexpected  weight change.  Respiratory: Negative for shortness of breath.   Cardiovascular: Negative for chest pain.       Past Medical History:  Diagnosis Date  . Abnormal pap    LEEP 2005  . Asthma   . Coronary artery disease   . Other general counseling and advice for contraceptive management   . Routine general medical examination at a health care facility   . Screening for ischemic heart disease   . Tobacco use disorder      Social History   Socioeconomic History  . Marital status: Married    Spouse name: Not on file  . Number of children: 3  . Years of education: Not on file  . Highest education level: Not on file  Occupational History  . Not on file  Social Needs  . Financial resource strain: Not on file  . Food insecurity:    Worry: Not on file    Inability: Not on file  . Transportation needs:    Medical: Not on file    Non-medical: Not on file  Tobacco Use  . Smoking status: Current Every Day Smoker    Packs/day: 1.00    Years: 25.00    Pack years: 25.00    Types: Cigarettes  . Smokeless tobacco: Never Used  Substance and Sexual Activity  . Alcohol use: No    Alcohol/week: 0.0 standard drinks  . Drug use: No  . Sexual activity: Not on file  Lifestyle  . Physical activity:    Days  per week: Not on file    Minutes per session: Not on file  . Stress: Not on file  Relationships  . Social connections:    Talks on phone: Not on file    Gets together: Not on file    Attends religious service: Not on file    Active member of club or organization: Not on file    Attends meetings of clubs or organizations: Not on file    Relationship status: Not on file  . Intimate partner violence:    Fear of current or ex partner: Not on file    Emotionally abused: Not on file    Physically abused: Not on file    Forced sexual activity: Not on file  Other Topics Concern  . Not on file  Social History Narrative   Married.   3 children, 1 grandchild.   Works in [employer].     Enjoys spending time with family.    Past Surgical History:  Procedure Laterality Date  . CARDIAC CATHETERIZATION N/A 11/14/2015   Procedure: Left Heart Cath and Coronary Angiography;  Surgeon: Iran Ouch, MD;  Location: [facility];  Service: Cardiovascular;  Laterality: N/A;    Family History  Problem Relation Age of Onset  . COPD Mother   . Heart disease Mother   . Neuropathy Father   . Thyroid disease Neg Hx     Allergies  Allergen Reactions  . Penicillins Shortness Of Breath and Swelling    Has patient had a PCN reaction causing immediate rash, facial/tongue/throat swelling, SOB or lightheadedness with hypotension: Yes Has patient had a PCN reaction causing severe rash involving mucus membranes or skin necrosis: No Has patient had a PCN reaction that required hospitalization No Has patient had a PCN reaction occurring within the last 10 years: No If all of the above answers are "NO", then may proceed with Cephalosporin use.   . Amitriptyline     Hallucinations  . Doxycycline   . Shellfish Allergy     Patient has not eaten any shellfish and does not know if she is allergic. She has had radiology images using dye with no reactions noted.     Current Outpatient Medications on File Prior to Visit  Medication Sig Dispense Refill  . albuterol (PROVENTIL HFA;VENTOLIN HFA) 108 (90 Base) MCG/ACT inhaler Inhale into the lungs. Every 6 hours as needed    . aspirin EC 81 MG tablet Take 1 tablet (81 mg total) by mouth daily. 90 tablet 3  . fluticasone (FLONASE) 50 MCG/ACT nasal spray Place 2 sprays into both nostrils daily. WILL NEED FOLLOW UP APPOINTMENT FOR ANY MORE REFILLS 16 g 0  . isosorbide mononitrate (IMDUR) 30 MG 24 hr tablet Take 0.5 tablets (15 mg total) by mouth daily. 30 tablet 6  . predniSONE (DELTASONE) 10 MG tablet Take 1 tablet (10 mg total) by mouth daily with breakfast. 6 tablet 0  . rosuvastatin (CRESTOR) 5 MG tablet Take 1 tablet (5 mg total) by  mouth daily at 6 PM. 30 tablet 6  . SYMBICORT 160-4.5 MCG/ACT inhaler INHALE 2 PUFFS BY MOUTH DAILY 10.2 g 0   Current Facility-Administered Medications on File Prior to Visit  Medication Dose Route Frequency Provider Last Rate Last Dose  . medroxyPROGESTERone (DEPO-PROVERA) injection 150 mg  150 mg Intramuscular Q90 days Dianne Dun, MD   150 mg at 12/25/16 1609    There were no vitals taken for this visit.   Objective:  Physical Exam  Constitutional: She is oriented to person, place, and time. She appears well-nourished.  Respiratory: Effort normal. No respiratory distress.  No cough  Neurological: She is oriented to person, place, and time.  Psychiatric: She has a normal mood and affect.           Assessment & Plan:

## 2018-12-07 NOTE — Patient Instructions (Signed)
Use the nicotrol daily as discussed.  Please update me in 2 weeks.  It was a pleasure to see you today!

## 2018-12-25 ENCOUNTER — Other Ambulatory Visit: Payer: Self-pay | Admitting: Primary Care

## 2018-12-25 DIAGNOSIS — J302 Other seasonal allergic rhinitis: Secondary | ICD-10-CM

## 2019-01-24 ENCOUNTER — Other Ambulatory Visit: Payer: Self-pay | Admitting: Family Medicine

## 2019-01-26 NOTE — Telephone Encounter (Signed)
Last prescribed on 07/20/2017 by Dr. Maryjane Hurter appointment on 12/07/2018. No future appointment

## 2019-01-26 NOTE — Telephone Encounter (Signed)
Patient needs CPE, please schedule. Once this has been scheduled I will send a refill.

## 2019-01-28 ENCOUNTER — Other Ambulatory Visit: Payer: Self-pay | Admitting: Family Medicine

## 2019-01-28 NOTE — Telephone Encounter (Signed)
Message left for patient to return my call.  

## 2019-01-29 MED ORDER — BUDESONIDE-FORMOTEROL FUMARATE 160-4.5 MCG/ACT IN AERO: 2.0000 | INHALATION_SPRAY | Freq: Every day | RESPIRATORY_TRACT | 0 refills | Status: DC

## 2019-02-01 NOTE — Telephone Encounter (Signed)
Sending letter asking patient to schedule a follow up.

## 2019-03-29 ENCOUNTER — Telehealth: Payer: Self-pay | Admitting: Primary Care

## 2019-03-29 NOTE — Telephone Encounter (Signed)
Yes, patient is overdue for CPE. We will need the follow up first and then we can discuss resuming her Depo Provera. This should be in office as we will need a pregnancy test.

## 2019-03-29 NOTE — Telephone Encounter (Signed)
Makayla Green, would like patient to schedule a follow up and addressed this as well?

## 2019-03-29 NOTE — Telephone Encounter (Signed)
Patient had her last Depo shot on 4/22. She stated she missed scheduling the last one.   When can we get her scheduled since she missed a dose

## 2019-03-30 NOTE — Telephone Encounter (Signed)
Attempted to reach patient to get this scheduled. Lvm asking her to call office.

## 2019-03-30 NOTE — Telephone Encounter (Signed)
We can do a follow up visit virtually, please schedule.

## 2019-03-30 NOTE — Telephone Encounter (Signed)
Patient is refusing to come in the office. She stated that due to her father's health, she is not willing to come in the office. I have told patient that she will need to come in office for the pregnancy test anyway so it would be best for her to come in (lab or not) but she still refuse. I have inform her that we have precautions in place due to Covid but she still will not schedule appointment.

## 2019-04-01 ENCOUNTER — Other Ambulatory Visit (INDEPENDENT_AMBULATORY_CARE_PROVIDER_SITE_OTHER): Payer: 59

## 2019-04-01 ENCOUNTER — Ambulatory Visit (INDEPENDENT_AMBULATORY_CARE_PROVIDER_SITE_OTHER): Payer: 59 | Admitting: Primary Care

## 2019-04-01 ENCOUNTER — Encounter: Payer: Self-pay | Admitting: Primary Care

## 2019-04-01 VITALS — Wt 148.0 lb

## 2019-04-01 DIAGNOSIS — J432 Centrilobular emphysema: Secondary | ICD-10-CM

## 2019-04-01 DIAGNOSIS — I2510 Atherosclerotic heart disease of native coronary artery without angina pectoris: Secondary | ICD-10-CM

## 2019-04-01 DIAGNOSIS — Z3042 Encounter for surveillance of injectable contraceptive: Secondary | ICD-10-CM | POA: Diagnosis not present

## 2019-04-01 DIAGNOSIS — E041 Nontoxic single thyroid nodule: Secondary | ICD-10-CM

## 2019-04-01 DIAGNOSIS — R7989 Other specified abnormal findings of blood chemistry: Secondary | ICD-10-CM | POA: Diagnosis not present

## 2019-04-01 DIAGNOSIS — J302 Other seasonal allergic rhinitis: Secondary | ICD-10-CM

## 2019-04-01 NOTE — Patient Instructions (Signed)
  Call the main line to schedule a lab appointment as discussed.  We will schedule your Depo Provera injection once your results return.   It was a pleasure to see you today! Allie Bossier, NP-C

## 2019-04-01 NOTE — Assessment & Plan Note (Signed)
Missed recent Depo Provera dose. HCG quantitative labs pending.

## 2019-04-01 NOTE — Assessment & Plan Note (Signed)
Overall stable on Flonase. Continue same.

## 2019-04-01 NOTE — Assessment & Plan Note (Signed)
TSH pending. 

## 2019-04-01 NOTE — Assessment & Plan Note (Signed)
Compliant to Imdur, Crestor, and aspirin.  Asymptomatic. Continue current regimen.

## 2019-04-01 NOTE — Progress Notes (Signed)
Subjective:    Patient ID: Makayla Green, female    DOB: 10-07-1970, 48 y.o.   MRN: [id number]  HPI  Virtual Visit via Video Note  I connected with Makayla Green on 04/01/19 at  3:00 PM EDT by a video enabled telemedicine application and verified that I am speaking with the correct person using two identifiers.  Location: Patient: Home Provider: Office   I discussed the limitations of evaluation and management by telemedicine and the availability of in person appointments. The patient expressed understanding and agreed to proceed.  History of Present Illness:  Ms. Guidry is a 48 year old female who presents today for follow up.  1) Birth Control: Currently managed on Depo Provera but has missed her recent dose that was due in July 2020. She would like to resume Depo Provera injections but does not prefer to provide a urine specimen, she prefers HCG blood draw.  2) Emphysema: Currently managed on Symbicort for which she uses during seasonal changes only. She will use the Symbicort for 1-2 months at a time twice a year with improvement in symptoms. She is using albuterol infrequently.   3) CAD: Currently following with cardiology and is compliant to isosorbide mononitrate and rosuvastatin.    Observations/Objective:  Alert and oriented. Appears well, not sickly. No distress. Speaking in complete sentences.   Assessment and Plan:  See problem based charting.  Follow Up Instructions:  Call the main line to schedule a lab appointment as discussed.  We will schedule your Depo Provera injection once your results return.   It was a pleasure to see you today! Allie Bossier, NP-C    I discussed the assessment and treatment plan with the patient. The patient was provided an opportunity to ask questions and all were answered. The patient agreed with the plan and demonstrated an understanding of the instructions.   The patient was advised to call back or seek an  in-person evaluation if the symptoms worsen or if the condition fails to improve as anticipated.     Pleas Koch, NP    Review of Systems  Eyes: Negative for visual disturbance.  Respiratory: Negative for shortness of breath.   Cardiovascular: Negative for chest pain.  Neurological: Negative for dizziness and headaches.       Past Medical History:  Diagnosis Date  . Abnormal pap    LEEP 2005  . Asthma   . Coronary artery disease   . Other general counseling and advice for contraceptive management   . Routine general medical examination at a health care facility   . Screening for ischemic heart disease   . Tobacco use disorder      Social History   Socioeconomic History  . Marital status: Married    Spouse name: Not on file  . Number of children: 3  . Years of education: Not on file  . Highest education level: Not on file  Occupational History  . Not on file  Social Needs  . Financial resource strain: Not on file  . Food insecurity    Worry: Not on file    Inability: Not on file  . Transportation needs    Medical: Not on file    Non-medical: Not on file  Tobacco Use  . Smoking status: Current Every Day Smoker    Packs/day: 1.00    Years: 25.00    Pack years: 25.00    Types: Cigarettes  . Smokeless tobacco: Never Used  Substance and Sexual  Activity  . Alcohol use: No    Alcohol/week: 0.0 standard drinks  . Drug use: No  . Sexual activity: Not on file  Lifestyle  . Physical activity    Days per week: Not on file    Minutes per session: Not on file  . Stress: Not on file  Relationships  . Social Musician on phone: Not on file    Gets together: Not on file    Attends religious service: Not on file    Active member of club or organization: Not on file    Attends meetings of clubs or organizations: Not on file    Relationship status: Not on file  . Intimate partner violence    Fear of current or ex partner: Not on file     Emotionally abused: Not on file    Physically abused: Not on file    Forced sexual activity: Not on file  Other Topics Concern  . Not on file  Social History Narrative   Married.   3 children, 1 grandchild.   Works in [employer].    Enjoys spending time with family.    Past Surgical History:  Procedure Laterality Date  . CARDIAC CATHETERIZATION N/A 11/14/2015   Procedure: Left Heart Cath and Coronary Angiography;  Surgeon: Iran Ouch, MD;  Location: [facility];  Service: Cardiovascular;  Laterality: N/A;    Family History  Problem Relation Age of Onset  . COPD Mother   . Heart disease Mother   . Neuropathy Father   . Thyroid disease Neg Hx     Allergies  Allergen Reactions  . Penicillins Shortness Of Breath and Swelling    Has patient had a PCN reaction causing immediate rash, facial/tongue/throat swelling, SOB or lightheadedness with hypotension: Yes Has patient had a PCN reaction causing severe rash involving mucus membranes or skin necrosis: No Has patient had a PCN reaction that required hospitalization No Has patient had a PCN reaction occurring within the last 10 years: No If all of the above answers are "NO", then may proceed with Cephalosporin use.   . Amitriptyline     Hallucinations  . Doxycycline   . Shellfish Allergy     Patient has not eaten any shellfish and does not know if she is allergic. She has had radiology images using dye with no reactions noted.     Current Outpatient Medications on File Prior to Visit  Medication Sig Dispense Refill  . albuterol (PROVENTIL HFA;VENTOLIN HFA) 108 (90 Base) MCG/ACT inhaler Inhale into the lungs. Every 6 hours as needed    . aspirin EC 81 MG tablet Take 1 tablet (81 mg total) by mouth daily. 90 tablet 3  . budesonide-formoterol (SYMBICORT) 160-4.5 MCG/ACT inhaler Inhale 2 puffs into the lungs daily. 10.2 g 0  . fluticasone (FLONASE) 50 MCG/ACT nasal spray Place 2 sprays into both nostrils daily. 16 g 2   . isosorbide mononitrate (IMDUR) 30 MG 24 hr tablet Take 0.5 tablets (15 mg total) by mouth daily. 30 tablet 6  . nicotine (NICOTROL) 10 MG inhaler Inhale 1 Cartridge (1 continuous puffing total) into the lungs as needed for smoking cessation. 42 each 0  . rosuvastatin (CRESTOR) 5 MG tablet Take 1 tablet (5 mg total) by mouth daily at 6 PM. 30 tablet 6   Current Facility-Administered Medications on File Prior to Visit  Medication Dose Route Frequency Provider Last Rate Last Dose  . medroxyPROGESTERone (DEPO-PROVERA) injection 150 mg  150 mg Intramuscular Q90 days Dianne DunAron, Talia M, MD   150 mg at 12/25/16 1609    Wt 148 lb (67.1 kg)   BMI 22.50 kg/m    Objective:   Physical Exam  Constitutional: She is oriented to person, place, and time. She appears well-nourished.  Respiratory: Effort normal.  Neurological: She is alert and oriented to person, place, and time.  Psychiatric: She has a normal mood and affect.           Assessment & Plan:

## 2019-04-01 NOTE — Assessment & Plan Note (Signed)
Repeat TSH pending

## 2019-04-01 NOTE — Assessment & Plan Note (Signed)
Doing well on current regimen of Symbicort, using albuterol infrequently. Continue same.

## 2019-04-02 LAB — LIPID PANEL
Cholesterol: 167 mg/dL (ref <200)
HDL: 59 mg/dL (ref >=50)
LDL Cholesterol (Calc): 83 mg/dL (calc)
Non-HDL Cholesterol (Calc): 108 mg/dL (calc) (ref <130)
Total CHOL/HDL Ratio: 2.8 (calc) (ref <5.0)
Triglycerides: 149 mg/dL (ref <150)

## 2019-04-02 LAB — COMPREHENSIVE METABOLIC PANEL
AG Ratio: 1.6 (calc) (ref 1.0–2.5)
ALT: 14 U/L (ref 6–29)
AST: 13 U/L (ref 10–35)
Albumin: 4.5 g/dL (ref 3.6–5.1)
Alkaline phosphatase (APISO): 77 U/L (ref 31–125)
BUN: 11 mg/dL (ref 7–25)
CO2: 26 mmol/L (ref 20–32)
Calcium: 9.9 mg/dL (ref 8.6–10.2)
Chloride: 104 mmol/L (ref 98–110)
Creat: 0.73 mg/dL (ref 0.50–1.10)
Globulin: 2.8 g/dL (calc) (ref 1.9–3.7)
Glucose, Bld: 120 mg/dL — ABNORMAL HIGH (ref 65–99)
Potassium: 4.5 mmol/L (ref 3.5–5.3)
Sodium: 140 mmol/L (ref 135–146)
Total Bilirubin: 0.3 mg/dL (ref 0.2–1.2)
Total Protein: 7.3 g/dL (ref 6.1–8.1)

## 2019-04-02 LAB — CBC
HCT: 45.2 % — ABNORMAL HIGH (ref 35.0–45.0)
Hemoglobin: 15.3 g/dL (ref 11.7–15.5)
MCH: 31.5 pg (ref 27.0–33.0)
MCHC: 33.8 g/dL (ref 32.0–36.0)
MCV: 93 fL (ref 80.0–100.0)
MPV: 10.3 fL (ref 7.5–12.5)
Platelets: 393 10*3/uL (ref 140–400)
RBC: 4.86 10*6/uL (ref 3.80–5.10)
RDW: 12.4 % (ref 11.0–15.0)
WBC: 11.6 10*3/uL — ABNORMAL HIGH (ref 3.8–10.8)

## 2019-04-02 LAB — TSH: TSH: 0.67 mIU/L

## 2019-04-02 LAB — HEMOGLOBIN A1C
Hgb A1c MFr Bld: 5.8 % of total Hgb — ABNORMAL HIGH (ref <5.7)
Mean Plasma Glucose: 120 (calc)
eAG (mmol/L): 6.6 (calc)

## 2019-04-02 LAB — HCG, QUANTITATIVE, PREGNANCY: HCG, Total, QN: <3 m[IU]/mL

## 2019-04-02 LAB — T4, FREE: Free T4: 1.4 ng/dL (ref 0.8–1.8)

## 2019-04-07 ENCOUNTER — Other Ambulatory Visit: Payer: Self-pay | Admitting: Cardiology

## 2019-04-07 NOTE — Telephone Encounter (Signed)
This is Dr. Croitoru's pt 

## 2019-04-09 ENCOUNTER — Other Ambulatory Visit: Payer: Self-pay | Admitting: Cardiovascular Disease

## 2019-04-13 ENCOUNTER — Ambulatory Visit (INDEPENDENT_AMBULATORY_CARE_PROVIDER_SITE_OTHER): Payer: 59 | Admitting: *Deleted

## 2019-04-13 DIAGNOSIS — Z3042 Encounter for surveillance of injectable contraceptive: Secondary | ICD-10-CM | POA: Diagnosis not present

## 2019-04-13 MED ORDER — MEDROXYPROGESTERONE ACETATE 150 MG/ML IM SUSP
150.0000 mg | Freq: Once | INTRAMUSCULAR | Status: AC
  Administered 2019-04-13: 09:00:00 150 mg via INTRAMUSCULAR

## 2019-04-13 NOTE — Progress Notes (Addendum)
Per orders of Dr. Danise Mina (for Makayla Green, AGNP-C), injection of medroxyPROGESTERone (DEPO-PROVERA) injection 150 mg given by Jamey Demchak. Patient tolerated injection well.

## 2019-05-12 ENCOUNTER — Ambulatory Visit (INDEPENDENT_AMBULATORY_CARE_PROVIDER_SITE_OTHER): Payer: 59

## 2019-05-12 DIAGNOSIS — Z23 Encounter for immunization: Secondary | ICD-10-CM | POA: Diagnosis not present

## 2019-07-08 ENCOUNTER — Other Ambulatory Visit: Payer: Self-pay

## 2019-07-08 MED ORDER — BUDESONIDE-FORMOTEROL FUMARATE 160-4.5 MCG/ACT IN AERO: 2.0000 | INHALATION_SPRAY | Freq: Every day | RESPIRATORY_TRACT | 2 refills | Status: DC

## 2019-07-14 ENCOUNTER — Telehealth: Payer: Self-pay | Admitting: Primary Care

## 2019-07-14 DIAGNOSIS — Z3042 Encounter for surveillance of injectable contraceptive: Secondary | ICD-10-CM

## 2019-07-14 DIAGNOSIS — Z3200 Encounter for pregnancy test, result unknown: Secondary | ICD-10-CM

## 2019-07-14 NOTE — Telephone Encounter (Signed)
Spoken to patient and she refuse to come in office to the urine test.   Patient is demanding to have blood drawn in her car.  Please advise.

## 2019-07-14 NOTE — Telephone Encounter (Signed)
Pt called to schedule her depo inj.  Her last one 9/9  When is she due

## 2019-07-14 NOTE — Telephone Encounter (Signed)
Okay to have blood drawn from the car, I spoke with both Terri and Tamra and they are okay with this. Please schedule patient for lab appointment with notes that labs will be drawn from the car.

## 2019-07-14 NOTE — Telephone Encounter (Signed)
Unfortunately, patient is out of the range. She should have came in the dates of Nov 25 - Dec 9.  Patient will need a nurse visit to due a pregnancy test. Then a week later second nurse visit and if the test is negative then she will get the injection on this appointment.

## 2019-07-15 ENCOUNTER — Other Ambulatory Visit (INDEPENDENT_AMBULATORY_CARE_PROVIDER_SITE_OTHER): Payer: 59

## 2019-07-15 ENCOUNTER — Other Ambulatory Visit: Payer: Self-pay

## 2019-07-15 DIAGNOSIS — Z3042 Encounter for surveillance of injectable contraceptive: Secondary | ICD-10-CM | POA: Diagnosis not present

## 2019-07-15 DIAGNOSIS — Z3200 Encounter for pregnancy test, result unknown: Secondary | ICD-10-CM

## 2019-07-15 LAB — HCG, QUANTITATIVE, PREGNANCY: Quantitative HCG: 4.4 m[IU]/mL

## 2019-07-19 NOTE — Telephone Encounter (Signed)
Pt called stating she had a rapid test covid done 12/12 @ [facility] in [place]  She would like to get another test done or an antibody test done.  She is having no symptoms and everyone else in household tested negative  4 other people  everyone is Audiological scientist.  When  can she go to get another test?   I gave her [facility] number [phone]   Pt had labs done here 12/11 in car

## 2019-07-19 NOTE — Telephone Encounter (Signed)
She can retest anytime.

## 2019-07-20 ENCOUNTER — Other Ambulatory Visit: Payer: 59

## 2019-08-19 ENCOUNTER — Telehealth: Payer: Self-pay | Admitting: *Deleted

## 2019-08-19 ENCOUNTER — Telehealth (INDEPENDENT_AMBULATORY_CARE_PROVIDER_SITE_OTHER): Payer: 59 | Admitting: Cardiovascular Disease

## 2019-08-19 VITALS — BP 121/88 | HR 116 | Ht 68.0 in | Wt 150.0 lb

## 2019-08-19 DIAGNOSIS — I209 Angina pectoris, unspecified: Secondary | ICD-10-CM | POA: Diagnosis not present

## 2019-08-19 DIAGNOSIS — R000 Tachycardia, unspecified: Secondary | ICD-10-CM | POA: Diagnosis not present

## 2019-08-19 DIAGNOSIS — I25118 Atherosclerotic heart disease of native coronary artery with other forms of angina pectoris: Secondary | ICD-10-CM

## 2019-08-19 DIAGNOSIS — E785 Hyperlipidemia, unspecified: Secondary | ICD-10-CM

## 2019-08-19 DIAGNOSIS — R059 Cough, unspecified: Secondary | ICD-10-CM

## 2019-08-19 DIAGNOSIS — Z720 Tobacco use: Secondary | ICD-10-CM | POA: Diagnosis not present

## 2019-08-19 DIAGNOSIS — E7800 Pure hypercholesterolemia, unspecified: Secondary | ICD-10-CM

## 2019-08-19 DIAGNOSIS — Z7189 Other specified counseling: Secondary | ICD-10-CM

## 2019-08-19 DIAGNOSIS — R05 Cough: Secondary | ICD-10-CM

## 2019-08-19 DIAGNOSIS — E041 Nontoxic single thyroid nodule: Secondary | ICD-10-CM

## 2019-08-19 NOTE — Patient Instructions (Signed)
Medication Instructions:  No changes *If you need a refill on your cardiac medications before your next appointment, please call your pharmacy*  Lab Work: Your provider would like for you to have the following labs: CBC, CMET and TSH  If you have labs (blood work) drawn today and your tests are completely normal, you will receive your results only by: Marland Kitchen MyChart Message (if you have MyChart) OR . A paper copy in the mail If you have any lab test that is abnormal or we need to change your treatment, we will call you to review the results.  Testing/Procedures: A chest x-ray takes a picture of the organs and structures inside the chest, including the heart, lungs, and blood vessels. This test can show several things, including, whether the heart is enlarges; whether fluid is building up in the lungs; and whether pacemaker / defibrillator leads are still in place.  This can be done at [place]  Your physician has recommended that you wear a 24 hr Holter monitors are medical devices that record the heart's electrical activity. Doctors most often use these monitors to diagnose arrhythmias. Arrhythmias are problems with the speed or rhythm of the heartbeat. The monitor is a small, portable device. You can wear one while you do your normal daily activities. This is usually used to diagnose what is causing palpitations/syncope (passing out).  [employer] Cardiac Event Monitor Instructions Your physician has requested you wear your cardiac event monitor for ___1__ day. [employer] may call or text to confirm a shipping address. The monitor will be sent to a land address via UPS. [employer] will not ship a monitor to a PO BOX. It typically takes 3-5 days to receive your monitor after it has been enrolled. [employer] will assist with USPS tracking if your package is delayed. The telephone number for [employer] is [phone]. Once you have received your monitor, please  review the enclosed instructions. Instruction tutorials can also be viewed under help and settings on the enclosed cell phone. Your monitor has already been registered assigning a specific monitor serial # to you.  Applying the monitor Remove cell phone from case and turn it on. The cell phone works as Dealer and needs to be within [employer] of you at all times. The cell phone will need to be charged on a daily basis. We recommend you plug the cell phone into the enclosed charger at your bedside table every night.  Monitor batteries: You will receive two monitor batteries labelled #1 and #2. These are your recorders. Plug battery #2 onto the second connection on the enclosed charger. Keep one battery on the charger at all times. This will keep the monitor battery deactivated. It will also keep it fully charged for when you need to switch your monitor batteries. A small light will be blinking on the battery emblem when it is charging. The light on the battery emblem will remain on when the battery is fully charged.  Open package of a Monitor strip. Insert battery #1 into black hood on strip and gently squeeze monitor battery onto connection as indicated in instruction booklet. Set aside while preparing skin.  Choose location for your strip, vertical or horizontal, as indicated in the instruction booklet. Shave to remove all hair from location. There cannot be any lotions, oils, powders, or colognes on skin where monitor is to be applied. Wipe skin clean with enclosed Saline wipe. Dry skin completely.  Peel paper labeled #1 off the back  of the Monitor strip exposing the adhesive. Place the monitor on the chest in the vertical or horizontal position shown in the instruction booklet. One arrow on the monitor strip must be pointing upward. Carefully remove paper labeled #2, attaching remainder of strip to your skin. Try not to create any folds or wrinkles in the strip as you apply  it.  Firmly press and release the circle in the center of the monitor battery. You will hear a small beep. This is turning the monitor battery on. The heart emblem on the monitor battery will light up every 5 seconds if the monitor battery in turned on and connected to the patient securely. Do not push and hold the circle down as this turns the monitor battery off. The cell phone will locate the monitor battery. A screen will appear on the cell phone checking the connection of your monitor strip. This may read poor connection initially but change to good connection within the next minute. Once your monitor accepts the connection you will hear a series of 3 beeps followed by a climbing crescendo of beeps. A screen will appear on the cell phone showing the two monitor strip placement options. Touch the picture that demonstrates where you applied the monitor strip.  Your monitor strip and battery are waterproof. You are able to shower, bathe, or swim with the monitor on. They just ask you do not submerge deeper than 3 feet underwater. We recommend removing the monitor if you are swimming in a lake, river, or ocean.  Your monitor battery will need to be switched to a fully charged monitor battery approximately once a week. The cell phone will alert you of an action which needs to be made.  On the cell phone, tap for details to reveal connection status, monitor battery status, and cell phone battery status. The green dots indicates your monitor is in good status. A red dot indicates there is something that needs your attention.  To record a symptom, click the circle on the monitor battery. In 30-60 seconds a list of symptoms will appear on the cell phone. Select your symptom and tap save. Your monitor will record a sustained or significant arrhythmia regardless of you clicking the button. Some patients do not feel the heart rhythm irregularities. [employer] will notify us of any serious or  critical events.  Refer to instruction booklet for instructions on switching batteries, changing strips, the Do not disturb or Pause features, or any additional questions.  Call [employer] at [phone], to confirm your monitor is transmitting and record your baseline. They will answer any questions you may have regarding the monitor instructions at that time.  Returning the monitor to [employer] Place all equipment back into blue box. Peel off strip of paper to expose adhesive and close box securely. There is a prepaid UPS shipping label on this box. Drop in a UPS drop box, or at a UPS facility like [employer]. You may also contact [employer] to arrange UPS to pick up monitor package at your home.   Follow-Up: At [facility], you and your health needs are our priority.  As part of our continuing mission to provide you with exceptional heart care, we have created designated Provider Care Teams.  These Care Teams include your primary Cardiologist (physician) and Advanced Practice Providers (APPs -  Physician Assistants and Nurse Practitioners) who all work together to provide you with the care you need, when you need it.  Your next appointment:   12 month(s)  The format for your next appointment:   In Person  Provider:   You may see Thurmon Fair, MD or one of the following Advanced Practice Providers on your designated Care Team:    Azalee Course, PA-C  Micah Flesher, PA-C or   Judy Pimple, [place]

## 2019-08-19 NOTE — Telephone Encounter (Signed)

## 2019-08-19 NOTE — Telephone Encounter (Signed)
The patient has been called about the virtual appointment today with Dr. Croitoru. Instructions provided. The AVS will be mailed. The patient verbalized their understanding.  

## 2019-08-19 NOTE — Progress Notes (Signed)
Virtual Visit via Telephone Note   This visit type was conducted due to national recommendations for restrictions regarding the COVID-19 Pandemic (e.g. social distancing) in an effort to limit this patient's exposure and mitigate transmission in our community.  Due to her co-morbid illnesses, this patient is at least at moderate risk for complications without adequate follow up.  This format is felt to be most appropriate for this patient at this time.  The patient did not have access to video technology/had technical difficulties with video requiring transitioning to audio format only (telephone).  All issues noted in this document were discussed and addressed.  No physical exam could be performed with this format.  Please refer to the patient's chart for her  consent to telehealth for [facility].   Date:  08/19/2019   ID:  Makayla Green, DOB 28-Mar-1971, MRN [id number]  Patient Location: Home Provider Location: Home  PCP:  Pleas Koch, NP  Cardiologist:  Keriana Sarsfield Electrophysiologist:  None   Evaluation Performed:  Follow-Up Visit  Chief Complaint: Tachycardia  History of Present Illness:    Makayla Green is a 49 y.o. female with history of premature onset CAD (prominent coronary calcification on CT but only minor stenoses on coronary angiography April 2018), ongoing tobacco abuse, nitrate responsive chest pain possibly due to coronary spasm.  She was diagnosed with COVID-19 on December 12.  She was not particularly symptomatic but was markedly tachycardic with a heart rate of 135 at rest.  She did not have shortness of breath and did not have a chest x-ray.  A month later she still has resting tachycardia with a heart rate that is at minimum 107, usually 120 or so.  She has an Apple watch that reports her heart rate, but when she ever tries to obtain an actual ECG tracing it reports "invalid reading".  She denies exertional dyspnea, but develops pain beneath her  left shoulder blade (her chronic anginal complaint) with physical activity.  She takes isosorbide, but has to take it in the evening because otherwise her blood pressure gets too low and she gets very sleepy.  She has done well with it taking it before bedtime for a couple of years now.  She has a cough productive of scanty sputum but denies wheezing, fever or chills.  She takes Symbicort.  She took it regularly twice a day and a couple of weeks after her diagnosis with Covid, but now is only taking it once daily.  Nevertheless the tachycardia persists.  She has not taken rescue albuterol.  She denies weight loss, insomnia, agitation, diarrhea or other symptoms of hyperthyroidism.  She did have a thyroid nodule biopsy performed years ago.  Her TSH was normal about 6 months ago.  The patient does not have symptoms concerning for COVID-19 infection (fever, chills, cough, or new shortness of breath).    Past Medical History:  Diagnosis Date  . Abnormal pap    LEEP 2005  . Asthma   . Coronary artery disease   . Other general counseling and advice for contraceptive management   . Routine general medical examination at a health care facility   . Screening for ischemic heart disease   . Solitary pulmonary nodule 07/06/2014    4 mm pulmonary nodule seen on November 2015 CT chest   . Tobacco use disorder    Past Surgical History:  Procedure Laterality Date  . CARDIAC CATHETERIZATION N/A 11/14/2015   Procedure: Left Heart Cath and Coronary Angiography;  Surgeon: Iran Ouch, MD;  Location: [facility] INVASIVE CV LAB;  Service: Cardiovascular;  Laterality: N/A;     No outpatient medications have been marked as taking for the 08/19/19 encounter (Telemedicine) with Esthefany Herrig, Rachelle Hora, MD.     Allergies:   Penicillins, Amitriptyline, Doxycycline, and Shellfish allergy   Social History   Tobacco Use  . Smoking status: Current Every Day Smoker    Packs/day: 1.00    Years: 25.00    Pack years: 25.00     Types: Cigarettes  . Smokeless tobacco: Never Used  Substance Use Topics  . Alcohol use: No    Alcohol/week: 0.0 standard drinks  . Drug use: No     Family Hx: The patient's family history includes COPD in her mother; Heart disease in her mother; Neuropathy in her father. There is no history of Thyroid disease.  ROS:   Please see the history of present illness.    All other systems reviewed and are negative.  Prior CV studies:   The following studies were reviewed today:  2017 coronary angiography  Labs/Other Tests and Data Reviewed:    EKG:  An ECG dated 09/24/2018 was personally reviewed today and demonstrated:  Normal sinus rhythm, incomplete right bundle branch block  Recent Labs: 04/01/2019: ALT 14; BUN 11; Creat 0.73; Hemoglobin 15.3; Platelets 393; Potassium 4.5; Sodium 140; TSH 0.67   Recent Lipid Panel Lab Results  Component Value Date/Time   CHOL 167 04/01/2019 03:01 PM   TRIG 149 04/01/2019 03:01 PM   HDL 59 04/01/2019 03:01 PM   CHOLHDL 2.8 04/01/2019 03:01 PM   LDLCALC 83 04/01/2019 03:01 PM    Wt Readings from Last 3 Encounters:  08/19/19 150 lb (68 kg)  04/01/19 148 lb (67.1 kg)  09/24/18 148 lb 12.8 oz (67.5 kg)     Objective:    Vital Signs:  BP 121/88   Pulse (!) 116   Ht 5\' 8"  (1.727 m)   Wt 150 lb (68 kg)   BMI 22.81 kg/m    VITAL SIGNS:  reviewed Unable to examine  ASSESSMENT & PLAN:    1. Angina pectoris: Symptoms are similar to previous complaints of angina pectoris, which we had attributed to coronary vasospasm.  They now appear to be associated with physical activity, but it is of note that she is quite tachycardic, which may be lowering her anginal threshold.  There are no symptoms that suggest unstable angina.  Continue aspirin, statin, isosorbide.  She reports that she becomes easily hypotensive so I am reluctant to add empirical beta-blockers.  She should call for emergency medical attention for symptoms the last for 30 minutes and  did not relieve with rest. 2. Tachycardia: The fact that her Apple Watch gives invalid readings makes me worried that she may be having a true arrhythmia, although otherwise it is very likely that she does have sinus tachycardia.  This could be related to her use of bronchodilators.  It seems to have started with her COVID-19 infection.  We will check a chest x-ray and a 24-hour Holter monitor to clarify rhythm.  Also check for anemia and hyperthyroidism with the lab tests. 3. HLP: Borderline satisfactory lipid profile a few months ago.  LDL was 83, ideally we would get it less than 70.  Continue statin. 4. Tobacco abuse: Reiterated the importance of smoking cessation.  She has had difficulty with smoking cessation aids (Chantix caused severe mood changes, patches caused allergic reaction).  COVID-19 Education: The signs and symptoms  of COVID-19 were discussed with the patient and how to seek care for testing (follow up with PCP or arrange E-visit).  The importance of social distancing was discussed today.  Time:   Today, I have spent 21 minutes with the patient with telehealth technology discussing the above problems.     Medication Adjustments/Labs and Tests Ordered: Current medicines are reviewed at length with the patient today.  Concerns regarding medicines are outlined above.   Tests Ordered: No orders of the defined types were placed in this encounter.   Medication Changes: No orders of the defined types were placed in this encounter.   Follow Up:  In Person 1 year  Signed, Thurmon Fair, MD  08/19/2019 9:28 AM     [facility]

## 2019-08-22 ENCOUNTER — Telehealth: Payer: Self-pay | Admitting: *Deleted

## 2019-08-22 NOTE — Telephone Encounter (Signed)
Patient enrolled for [employer] to mail a 3 day ZIO XT long term holter monitor to her home.  Instructions reviewed briefly as they are included in the monitor kit.

## 2019-08-23 ENCOUNTER — Ambulatory Visit
Admission: RE | Admit: 2019-08-23 | Discharge: 2019-08-23 | Disposition: A | Discharge status: Home / Self Care | Payer: 59 | Source: Ambulatory Visit | Attending: Cardiovascular Disease | Admitting: Cardiovascular Disease

## 2019-08-26 ENCOUNTER — Ambulatory Visit (INDEPENDENT_AMBULATORY_CARE_PROVIDER_SITE_OTHER): Payer: 59

## 2019-08-26 DIAGNOSIS — R000 Tachycardia, unspecified: Secondary | ICD-10-CM

## 2019-09-13 ENCOUNTER — Telehealth: Payer: Self-pay

## 2019-09-14 ENCOUNTER — Encounter: Payer: Self-pay | Admitting: Cardiovascular Disease

## 2019-09-14 ENCOUNTER — Ambulatory Visit (INDEPENDENT_AMBULATORY_CARE_PROVIDER_SITE_OTHER)
Admission: RE | Admit: 2019-09-14 | Discharge: 2019-09-14 | Disposition: A | Discharge status: Home / Self Care | Payer: 59 | Source: Ambulatory Visit

## 2019-09-14 DIAGNOSIS — R0981 Nasal congestion: Secondary | ICD-10-CM | POA: Diagnosis not present

## 2019-09-14 DIAGNOSIS — R05 Cough: Secondary | ICD-10-CM

## 2019-09-14 DIAGNOSIS — R059 Cough, unspecified: Secondary | ICD-10-CM

## 2019-09-14 MED ORDER — PREDNISONE 20 MG PO TABS: 40.0000 mg | ORAL_TABLET | Freq: Every day | ORAL | 0 refills | Status: AC

## 2019-09-14 NOTE — Discharge Instructions (Addendum)
Take steroid as prescribed. Recommend using Symbicort only twice daily, albuterol inhaler no more than every 4 hours as needed. Follow-up with PCP for further evaluation and management as needed. Recommend in-person evaluation for persistent, worsening pain, difficulty breathing, cough, coughing up blood, chest pain, fever.

## 2019-09-14 NOTE — ED Provider Notes (Signed)
Virtual Visit via Video Note:  [name]  initiated request for Telemedicine visit with [facility] team. I connected with Karalee Height  on 09/14/2019 at 7:20 PM  for a synchronized telemedicine visit using a video enabled HIPPA compliant telemedicine application. I verified that I am speaking with Karalee Height  using two identifiers. [place] Hall-Potvin, PA-C  was physically located in a [facility] site and Oluwakemi Salsberry was located at a different location.   The limitations of evaluation and management by telemedicine as well as the availability of in-person appointments were discussed. Patient was informed that she  may incur a bill ( including co-pay) for this virtual visit encounter. Gweneth Jeani Hawking Rayborn  expressed understanding and gave verbal consent to proceed with virtual visit.     History of Present Illness:Makayla Green  is a 49 y.o. female with history of asthma, CAD and former tobacco use presents for severe sinus pressure, cough for the last 2 days.  States it started night before last with symptoms that remind patient of a sinus infection.  States it feels like there is an ice pick in her sinuses and that her nose is congested.  Patient also having cough that has mild sputum production: Denies blood, chest pain, wheezing.  Patient has had increased dyspnea for which she is using her albuterol inhaler 6-8 times a day, Symbicort 3 times a day with adequate relief.  Patient is status post Covid infection: When out of isolation 07/29/2019.  Patient had chest x-ray at time of cardiology visit on 08/23/2019 that was normal.  No change in medication at that time.  Patient states sinusitis and bronchitis is recurrent, will tend to develop into in a walking pneumonia, and states that she had to be on antibiotics and steroids for 10 months one year due to this.  No fever, chills, arthralgias, myalgias, nausea, vomiting, abdominal pain,  diarrhea.   Past Medical History:  Diagnosis Date  . Abnormal pap    LEEP 2005  . Asthma   . Coronary artery disease   . Other general counseling and advice for contraceptive management   . Routine general medical examination at a health care facility   . Screening for ischemic heart disease   . Solitary pulmonary nodule 07/06/2014    4 mm pulmonary nodule seen on November 2015 CT chest   . Tobacco use disorder     Allergies  Allergen Reactions  . Penicillins Shortness Of Breath and Swelling    Has patient had a PCN reaction causing immediate rash, facial/tongue/throat swelling, SOB or lightheadedness with hypotension: Yes Has patient had a PCN reaction causing severe rash involving mucus membranes or skin necrosis: No Has patient had a PCN reaction that required hospitalization No Has patient had a PCN reaction occurring within the last 10 years: No If all of the above answers are "NO", then may proceed with Cephalosporin use.   . Amitriptyline     Hallucinations  . Doxycycline   . Shellfish Allergy     Patient has not eaten any shellfish and does not know if she is allergic. She has had radiology images using dye with no reactions noted.         Observations/Objective: 49 year old female sitting in no acute distress.  Patient is able to speak in full sentences without wheezing.  Breathing rate and effort normal.  Infrequent cough noted.  Assessment and Plan: H&P consistent with acute sinus congestion, bronchitis.  Discussed that treatment plan limited due to limited assessment via video platform: Discussed patient could come in person for evaluation-declined.  Will trial steroid for symptomatic relief there is low concern for acute HF, PE, pneumonia at this time given patient's appearance and short duration of symptoms.  Patient declined Kimberlee Nearing stating she will eventually need antibiotics, though willing to do steroid.  Return precautions discussed, patient verbalized  understanding and is agreeable to plan.  Follow Up Instructions: Patient to follow-up with PCP via patient portal tomorrow, seek in-person evaluation for persistent, worsening symptoms.   I discussed the assessment and treatment plan with the patient. The patient was provided an opportunity to ask questions and all were answered. The patient agreed with the plan and demonstrated an understanding of the instructions.   The patient was advised to call back or seek an in-person evaluation if the symptoms worsen or if the condition fails to improve as anticipated.  I provided 20 minutes of non-face-to-face time during this encounter.    [place] Hall-Potvin, PA-C  09/14/2019 7:20 PM        Hall-Potvin, [place], [place] 09/14/19 1921

## 2019-09-15 ENCOUNTER — Other Ambulatory Visit: Payer: Self-pay | Admitting: *Deleted

## 2019-09-15 MED ORDER — AZITHROMYCIN 500 MG PO TABS: 500.0000 mg | ORAL_TABLET | Freq: Every day | ORAL | 0 refills | Status: DC

## 2019-09-21 ENCOUNTER — Telehealth: Payer: Self-pay | Admitting: Primary Care

## 2019-09-21 ENCOUNTER — Telehealth: Payer: Self-pay | Admitting: Endocrinology

## 2019-09-21 NOTE — Telephone Encounter (Signed)
Patient called today She stated she spoke with Dr Sharen Hones and was advise she could Transfer her care to him.  Is this transfer okay?

## 2019-09-21 NOTE — Telephone Encounter (Signed)
Please advise 

## 2019-09-21 NOTE — Telephone Encounter (Signed)
I see her dad. Ok by me if ok by current PCP.

## 2019-09-21 NOTE — Telephone Encounter (Signed)
Patient has had COVID and is now experiencing abnormal Thyroid levels, abnormal blood sugars and would like advise on how to proceed.  Patient was advise that an appointment would be appropriate at this time but because of family member with lung cancer is trying to be cautious.

## 2019-09-21 NOTE — Telephone Encounter (Signed)
Please advise f/u next avail-vv is OK 

## 2019-09-21 NOTE — Telephone Encounter (Signed)
Pt scheduled for VV 2/22 @ 115pm

## 2019-09-22 NOTE — Telephone Encounter (Signed)
Yes. Thanks 

## 2019-09-26 ENCOUNTER — Other Ambulatory Visit: Payer: Self-pay

## 2019-09-26 ENCOUNTER — Encounter: Payer: Self-pay | Admitting: Endocrinology

## 2019-09-26 ENCOUNTER — Ambulatory Visit (INDEPENDENT_AMBULATORY_CARE_PROVIDER_SITE_OTHER): Payer: 59 | Admitting: Endocrinology

## 2019-09-26 DIAGNOSIS — E041 Nontoxic single thyroid nodule: Secondary | ICD-10-CM | POA: Diagnosis not present

## 2019-09-26 MED ORDER — METHIMAZOLE 5 MG PO TABS: 2.5000 mg | ORAL_TABLET | Freq: Every day | ORAL | 3 refills | Status: DC

## 2019-09-26 NOTE — Progress Notes (Signed)
Subjective:    Patient ID: Makayla Green, female    DOB: 02/01/1971, 49 y.o.   MRN: [id number]  HPI  telehealth visit today via doxy video visit.  Alternatives to telehealth are presented to this patient, and the patient agrees to the telehealth visit.   Pt is advised of the cost of the visit, and agrees to this, also.   Patient is at home, and I am at the office.   Persons attending the telehealth visit: the patient and I Pt returns for f/u of multinodular goiter (dx'ed 2017, incidentally on CT; bx in 2017 was Cat 2). She does not notice the goiter.  pt states she feels better, since recent coronavirus infection.  Pt says she had TFT on 09/14/19.   Past Medical History:  Diagnosis Date  . Abnormal pap    LEEP 2005  . Asthma   . Coronary artery disease   . Other general counseling and advice for contraceptive management   . Routine general medical examination at a health care facility   . Screening for ischemic heart disease   . Solitary pulmonary nodule 07/06/2014    4 mm pulmonary nodule seen on November 2015 CT chest   . Tobacco use disorder     Past Surgical History:  Procedure Laterality Date  . CARDIAC CATHETERIZATION N/A 11/14/2015   Procedure: Left Heart Cath and Coronary Angiography;  Surgeon: Wellington Hampshire, MD;  Location: [facility];  Service: Cardiovascular;  Laterality: N/A;    Social History   Socioeconomic History  . Marital status: Married    Spouse name: Not on file  . Number of children: 3  . Years of education: Not on file  . Highest education level: Not on file  Occupational History  . Not on file  Tobacco Use  . Smoking status: Current Every Day Smoker    Packs/day: 1.00    Years: 25.00    Pack years: 25.00    Types: Cigarettes  . Smokeless tobacco: Never Used  Substance and Sexual Activity  . Alcohol use: No    Alcohol/week: 0.0 standard drinks  . Drug use: No  . Sexual activity: Not on file  Other Topics Concern  . Not on  file  Social History Narrative   Married.   3 children, 1 grandchild.   Works in [employer].    Enjoys spending time with family.   Social Determinants of Health   Financial Resource Strain:   . Difficulty of Paying Living Expenses: Not on file  Food Insecurity:   . Worried About Charity fundraiser in the Last Year: Not on file  . Ran Out of Food in the Last Year: Not on file  Transportation Needs:   . Lack of Transportation (Medical): Not on file  . Lack of Transportation (Non-Medical): Not on file  Physical Activity:   . Days of Exercise per Week: Not on file  . Minutes of Exercise per Session: Not on file  Stress:   . Feeling of Stress : Not on file  Social Connections:   . Frequency of Communication with Friends and Family: Not on file  . Frequency of Social Gatherings with Friends and Family: Not on file  . Attends Religious Services: Not on file  . Active Member of Clubs or Organizations: Not on file  . Attends Archivist Meetings: Not on file  . Marital Status: Not on file  Intimate Partner Violence:   . Fear of Current  or Ex-Partner: Not on file  . Emotionally Abused: Not on file  . Physically Abused: Not on file  . Sexually Abused: Not on file    Current Outpatient Medications on File Prior to Visit  Medication Sig Dispense Refill  . albuterol (PROVENTIL HFA;VENTOLIN HFA) 108 (90 Base) MCG/ACT inhaler Inhale into the lungs. Every 6 hours as needed    . aspirin EC 81 MG tablet Take 1 tablet (81 mg total) by mouth daily. 90 tablet 3  . azithromycin (ZITHROMAX) 500 MG tablet Take 1 tablet (500 mg total) by mouth daily. 3 tablet 0  . budesonide-formoterol (SYMBICORT) 160-4.5 MCG/ACT inhaler Inhale 2 puffs into the lungs daily. 10.2 g 2  . fluticasone (FLONASE) 50 MCG/ACT nasal spray Place 2 sprays into both nostrils daily. 16 g 2  . isosorbide mononitrate (IMDUR) 30 MG 24 hr tablet TAKE HALF TABLET BY MOUTH DAILY. 45 tablet 1  . nicotine (NICOTROL) 10 MG  inhaler Inhale 1 Cartridge (1 continuous puffing total) into the lungs as needed for smoking cessation. 42 each 0  . rosuvastatin (CRESTOR) 5 MG tablet TAKE 1 TABLET (5 MG TOTAL) BY MOUTH DAILY AT 6 PM. 30 tablet 5   No current facility-administered medications on file prior to visit.    Allergies  Allergen Reactions  . Penicillins Shortness Of Breath and Swelling    Has patient had a PCN reaction causing immediate rash, facial/tongue/throat swelling, SOB or lightheadedness with hypotension: Yes Has patient had a PCN reaction causing severe rash involving mucus membranes or skin necrosis: No Has patient had a PCN reaction that required hospitalization No Has patient had a PCN reaction occurring within the last 10 years: No If all of the above answers are "NO", then may proceed with Cephalosporin use.   . Amitriptyline     Hallucinations  . Doxycycline   . Shellfish Allergy     Patient has not eaten any shellfish and does not know if she is allergic. She has had radiology images using dye with no reactions noted.     Family History  Problem Relation Age of Onset  . COPD Mother   . Heart disease Mother   . Neuropathy Father   . Thyroid disease Neg Hx     There were no vitals taken for this visit.   Review of Systems     Objective:   Physical Exam    outside test results are reviewed: TSH=0.32    Assessment & Plan:  MNG: usually hereditary Hyperthyroidism, new: due to the MNG.   Anxiety: this is an indication for anti-thyroid rx, despite TSH being just mildly abnormal  Patient Instructions  I have sent a prescription to your pharmacy, to slow the thyroid. If ever you have fever while taking methimazole, stop it and call us, even if the reason is obvious, because of the risk of a rare side-effect. It is best to never miss the medication.  However, if you do miss it, next best is to double up the next time.   Please come back for a follow-up appointment in 1 month.

## 2019-09-26 NOTE — Patient Instructions (Addendum)
I have sent a prescription to your pharmacy, to slow the thyroid. If ever you have fever while taking methimazole, stop it and call us, even if the reason is obvious, because of the risk of a rare side-effect. It is best to never miss the medication.  However, if you do miss it, next best is to double up the next time.   Please come back for a follow-up appointment in 1 month. 

## 2019-10-13 ENCOUNTER — Other Ambulatory Visit: Payer: Self-pay | Admitting: Cardiology

## 2019-10-17 ENCOUNTER — Encounter: Payer: Self-pay | Admitting: Endocrinology

## 2019-10-25 ENCOUNTER — Other Ambulatory Visit: Payer: Self-pay

## 2019-10-26 ENCOUNTER — Encounter: Payer: Self-pay | Admitting: Endocrinology

## 2019-10-26 ENCOUNTER — Ambulatory Visit: Payer: 59 | Admitting: Endocrinology

## 2019-10-26 VITALS — BP 110/76 | HR 105 | Ht 68.0 in | Wt 152.0 lb

## 2019-10-26 DIAGNOSIS — R7989 Other specified abnormal findings of blood chemistry: Secondary | ICD-10-CM

## 2019-10-26 LAB — TSH: TSH: 0.72 u[IU]/mL (ref 0.35–4.50)

## 2019-10-26 LAB — T4, FREE: Free T4: 1.08 ng/dL (ref 0.60–1.60)

## 2019-10-26 NOTE — Progress Notes (Signed)
Subjective:    Patient ID: Makayla Green, female    DOB: December 02, 1970, 49 y.o.   MRN: [id number]  HPI Pt returns for f/u of hyperthyroidism (dx'ed 2021; MNG was dx'ed 2017, incidentally on CT; bx in 2017 was Cat 2). She does not notice the goiter.  Main symptom is headache Past Medical History:  Diagnosis Date  . Abnormal pap    LEEP 2005  . Asthma   . Coronary artery disease   . Other general counseling and advice for contraceptive management   . Routine general medical examination at a health care facility   . Screening for ischemic heart disease   . Solitary pulmonary nodule 07/06/2014    4 mm pulmonary nodule seen on November 2015 CT chest   . Tobacco use disorder     Past Surgical History:  Procedure Laterality Date  . CARDIAC CATHETERIZATION N/A 11/14/2015   Procedure: Left Heart Cath and Coronary Angiography;  Surgeon: Iran Ouch, MD;  Location: [facility];  Service: Cardiovascular;  Laterality: N/A;    Social History   Socioeconomic History  . Marital status: Married    Spouse name: Not on file  . Number of children: 3  . Years of education: Not on file  . Highest education level: Not on file  Occupational History  . Not on file  Tobacco Use  . Smoking status: Current Every Day Smoker    Packs/day: 1.00    Years: 25.00    Pack years: 25.00    Types: Cigarettes  . Smokeless tobacco: Never Used  Substance and Sexual Activity  . Alcohol use: No    Alcohol/week: 0.0 standard drinks  . Drug use: No  . Sexual activity: Not on file  Other Topics Concern  . Not on file  Social History Narrative   Married.   3 children, 1 grandchild.   Works in [employer].    Enjoys spending time with family.   Social Determinants of Health   Financial Resource Strain:   . Difficulty of Paying Living Expenses:   Food Insecurity:   . Worried About Programme researcher, broadcasting/film/video in the Last Year:   . Barista in the Last Year:   Transportation Needs:   .  Freight forwarder (Medical):   Marland Kitchen Lack of Transportation (Non-Medical):   Physical Activity:   . Days of Exercise per Week:   . Minutes of Exercise per Session:   Stress:   . Feeling of Stress :   Social Connections:   . Frequency of Communication with Friends and Family:   . Frequency of Social Gatherings with Friends and Family:   . Attends Religious Services:   . Active Member of Clubs or Organizations:   . Attends Banker Meetings:   Marland Kitchen Marital Status:   Intimate Partner Violence:   . Fear of Current or Ex-Partner:   . Emotionally Abused:   Marland Kitchen Physically Abused:   . Sexually Abused:     Current Outpatient Medications on File Prior to Visit  Medication Sig Dispense Refill  . albuterol (PROVENTIL HFA;VENTOLIN HFA) 108 (90 Base) MCG/ACT inhaler Inhale into the lungs. Every 6 hours as needed    . aspirin EC 81 MG tablet Take 1 tablet (81 mg total) by mouth daily. 90 tablet 3  . azithromycin (ZITHROMAX) 500 MG tablet Take 1 tablet (500 mg total) by mouth daily. 3 tablet 0  . budesonide-formoterol (SYMBICORT) 160-4.5 MCG/ACT inhaler Inhale 2 puffs  into the lungs daily. 10.2 g 2  . fluticasone (FLONASE) 50 MCG/ACT nasal spray Place 2 sprays into both nostrils daily. 16 g 2  . isosorbide mononitrate (IMDUR) 30 MG 24 hr tablet TAKE HALF TABLET BY MOUTH DAILY. 45 tablet 1  . methimazole (TAPAZOLE) 5 MG tablet Take 0.5 tablets (2.5 mg total) by mouth daily. 30 tablet 3  . nicotine (NICOTROL) 10 MG inhaler Inhale 1 Cartridge (1 continuous puffing total) into the lungs as needed for smoking cessation. 42 each 0  . rosuvastatin (CRESTOR) 5 MG tablet TAKE 1 TABLET (5 MG TOTAL) BY MOUTH DAILY AT 6 PM. 30 tablet 5   No current facility-administered medications on file prior to visit.    Allergies  Allergen Reactions  . Penicillins Shortness Of Breath and Swelling    Has patient had a PCN reaction causing immediate rash, facial/tongue/throat swelling, SOB or lightheadedness  with hypotension: Yes Has patient had a PCN reaction causing severe rash involving mucus membranes or skin necrosis: No Has patient had a PCN reaction that required hospitalization No Has patient had a PCN reaction occurring within the last 10 years: No If all of the above answers are "NO", then may proceed with Cephalosporin use.   . Amitriptyline     Hallucinations  . Doxycycline   . Shellfish Allergy     Patient has not eaten any shellfish and does not know if she is allergic. She has had radiology images using dye with no reactions noted.     Family History  Problem Relation Age of Onset  . COPD Mother   . Heart disease Mother   . Neuropathy Father   . Thyroid disease Neg Hx     BP 110/76   Pulse (!) 105   Ht 5\' 8"  (1.727 m)   Wt 152 lb (68.9 kg)   SpO2 98%   BMI 23.11 kg/m    Review of Systems Denies fever    Objective:   Physical Exam VITAL SIGNS:  See vs page GENERAL: no distress NECK: thyroid is slightly enlarged, with irreg surface.    Lab Results  Component Value Date   TSH 0.72 10/26/2019       Assessment & Plan:  Hyperparathyroidism: well-controlled Headache, and other sxs: I told pt these are not thyroid-related Please continue the same medications Please come back for a follow-up appointment in 2 months.

## 2019-10-26 NOTE — Patient Instructions (Addendum)
Blood tests are requested for you today.  We'll let you know about the results.  If ever you have fever while taking methimazole, stop it and call us, even if the reason is obvious, because of the risk of a rare side-effect. It is best to never miss the medication.  However, if you do miss it, next best is to double up the next time.   Please come back for a follow-up appointment in 1 month.   

## 2019-10-27 ENCOUNTER — Ambulatory Visit: Payer: 59 | Admitting: Endocrinology

## 2019-10-28 ENCOUNTER — Other Ambulatory Visit: Payer: Self-pay

## 2019-10-28 ENCOUNTER — Telehealth: Payer: Self-pay | Admitting: Family Medicine

## 2019-10-28 ENCOUNTER — Encounter: Payer: Self-pay | Admitting: Family Medicine

## 2019-10-28 ENCOUNTER — Ambulatory Visit: Payer: 59 | Admitting: Family Medicine

## 2019-10-28 VITALS — BP 118/72 | HR 116 | Temp 97.8°F | Ht 67.0 in | Wt 152.2 lb

## 2019-10-28 DIAGNOSIS — F17200 Nicotine dependence, unspecified, uncomplicated: Secondary | ICD-10-CM

## 2019-10-28 DIAGNOSIS — I25118 Atherosclerotic heart disease of native coronary artery with other forms of angina pectoris: Secondary | ICD-10-CM

## 2019-10-28 DIAGNOSIS — J432 Centrilobular emphysema: Secondary | ICD-10-CM | POA: Diagnosis not present

## 2019-10-28 DIAGNOSIS — E0590 Thyrotoxicosis, unspecified without thyrotoxic crisis or storm: Secondary | ICD-10-CM | POA: Diagnosis not present

## 2019-10-28 DIAGNOSIS — R000 Tachycardia, unspecified: Secondary | ICD-10-CM

## 2019-10-28 DIAGNOSIS — J302 Other seasonal allergic rhinitis: Secondary | ICD-10-CM

## 2019-10-28 MED ORDER — METOPROLOL SUCCINATE ER 25 MG PO TB24: 12.5000 mg | ORAL_TABLET | Freq: Every day | ORAL | 1 refills | Status: DC

## 2019-10-28 MED ORDER — FEXOFENADINE HCL 180 MG PO TABS: 180.0000 mg | ORAL_TABLET | Freq: Every day | ORAL | Status: AC | PRN

## 2019-10-28 MED ORDER — NICOTINE 10 MG IN INHA: 1.0000 | RESPIRATORY_TRACT | 0 refills | Status: DC | PRN

## 2019-10-28 NOTE — Telephone Encounter (Signed)
Pt also sent a MyChat messasge [see Pt Msg, 10/28/19] with same request which was fwd to Dr. Reece Agar.

## 2019-10-28 NOTE — Patient Instructions (Addendum)
Continue methimazole. Start metoprolol XL 25mg  1/2 tablet daily to slow down the heart some.  Update me with effect.  Return in 6 weeks for follow up visit.  Return at your convenience for physical.

## 2019-10-28 NOTE — Progress Notes (Signed)
This visit was conducted in person.  BP 118/72 (BP Location: Left Arm, Patient Position: Sitting, Cuff Size: Normal)   Pulse (!) 116   Temp 97.8 F (36.6 C) (Temporal)   Ht 5\' 7"  (1.702 m)   Wt 152 lb 4 oz (69.1 kg)   LMP  (Within Years) Comment: 14 yrs  SpO2 97%   BMI 23.85 kg/m    CC: transfer of care  Subjective:    Patient ID: , female    DOB: 1971-02-02, 49 y.o.   MRN: [id number]  HPI: Makayla Green is a 49 y.o. female presenting on 10/28/2019 for Transitions Of Care (Wants to discuss elevated BP readings since starting thyroid med and increased HR since COVID pos, 07/12/19.  States that was her main sxs. )   Previously saw 14/8/20, asked to transfer care as I see her father.   States she is staying tachycardic since COVID-19 illness 07/2019. She was diagnosed with positive nasal swab x2. Her only symptom was racing heart. She developed hyperthyroidism around that time as well. H/o MNG. She is currently on methimazole 2.5mg  daily. She sees Dr 08/2019 endocrinologist. States recently advised to only take on even days. Has headache localized to R head and face since starting methimazole.   Hyperparathyroidism history as well, currently seems inactive.   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 On aspirin, statin, isosorbide for this reason.   Endorses chronic R hand tremor since age 49 yo, but recently worsening and bilateral.   Asthma - managed with symbicort 2 puffs once a day and albuterol inhaler PRN (allergies tend to flare asthma). Allegra and flonase during allergy season   Smoker - down to 1/2 ppd.   Married. Lives with husband, 2 sons and FIL 3 children, 1 grandchild. Occ: works in [employer].      Relevant past medical, surgical, family and social history reviewed and updated as indicated. Interim medical history since our last visit reviewed. Allergies and medications reviewed  and updated. Outpatient Medications Prior to Visit  Medication Sig Dispense Refill  . albuterol (PROVENTIL HFA;VENTOLIN HFA) 108 (90 Base) MCG/ACT inhaler Inhale into the lungs. Every 6 hours as needed    . aspirin EC 81 MG tablet Take 1 tablet (81 mg total) by mouth daily. 90 tablet 3  . budesonide-formoterol (SYMBICORT) 160-4.5 MCG/ACT inhaler Inhale 2 puffs into the lungs daily. 10.2 g 2  . fluticasone (FLONASE) 50 MCG/ACT nasal spray Place 2 sprays into both nostrils daily. 16 g 2  . isosorbide mononitrate (IMDUR) 30 MG 24 hr tablet TAKE HALF TABLET BY MOUTH DAILY. 45 tablet 1  . methimazole (TAPAZOLE) 5 MG tablet Take 0.5 tablets (2.5 mg total) by mouth daily. 30 tablet 3  . rosuvastatin (CRESTOR) 5 MG tablet TAKE 1 TABLET (5 MG TOTAL) BY MOUTH DAILY AT 6 PM. 30 tablet 5  . nicotine (NICOTROL) 10 MG inhaler Inhale 1 Cartridge (1 continuous puffing total) into the lungs as needed for smoking cessation. 42 each 0  . azithromycin (ZITHROMAX) 500 MG tablet Take 1 tablet (500 mg total) by mouth daily. 3 tablet 0   No facility-administered medications prior to visit.     Per HPI unless specifically indicated in ROS section below Review of Systems Objective:    BP 118/72 (BP Location: Left Arm, Patient Position: Sitting, Cuff Size: Normal)   Pulse (!) 116   Temp 97.8 F (36.6 C) (Temporal)   Ht 5'  7" (1.702 m)   Wt 152 lb 4 oz (69.1 kg)   LMP  (Within Years) Comment: 14 yrs  SpO2 97%   BMI 23.85 kg/m   Wt Readings from Last 3 Encounters:  10/28/19 152 lb 4 oz (69.1 kg)  10/26/19 152 lb (68.9 kg)  08/19/19 150 lb (68 kg)    Physical Exam Vitals and nursing note reviewed.  Constitutional:      Appearance: Normal appearance. She is not ill-appearing.  HENT:     Head: Normocephalic and atraumatic.  Eyes:     Extraocular Movements: Extraocular movements intact.     Pupils: Pupils are equal, round, and reactive to light.     Comments: No exophthalmos  Neck:     Thyroid:  Thyromegaly (R sided - nontender) present. No thyroid tenderness.  Cardiovascular:     Rate and Rhythm: Regular rhythm. Tachycardia present.     Pulses: Normal pulses.     Heart sounds: Normal heart sounds. No murmur.  Pulmonary:     Effort: Pulmonary effort is normal. No respiratory distress.     Breath sounds: Normal breath sounds. No wheezing, rhonchi or rales.  Musculoskeletal:     Right lower leg: No edema.     Left lower leg: No edema.  Skin:    General: Skin is warm and dry.     Findings: No rash.  Neurological:     Mental Status: She is alert.     Comments: Postural action tremor present R>L hands  Psychiatric:        Mood and Affect: Mood normal.        Behavior: Behavior normal.       Results for orders placed or performed in visit on 10/26/19  TSH  Result Value Ref Range   TSH 0.72 0.35 - 4.50 uIU/mL  T4, free  Result Value Ref Range   Free T4 1.08 0.60 - 1.60 ng/dL   Assessment & Plan:  This visit occurred during the SARS-CoV-2 public health emergency.  Safety protocols were in place, including screening questions prior to the visit, additional usage of staff PPE, and extensive cleaning of exam room while observing appropriate contact time as indicated for disinfecting solutions.  Next physical will be due 03/2020  Problem List Items Addressed This Visit    TOBACCO ABUSE    Continue to encourage smoking cessation - currently in action phase using nicotine inhalers - refilled to requested pharmacy.       Relevant Medications   nicotine (NICOTROL) 10 MG inhaler   Tachycardia    Developed after covid illness, in setting of being treated for hyperthyroidism as well. Start low dose beta blocker (caution in h/o hypotension). Update with effect.  Will need to review caffeine intake and encourage good hydration status.       Seasonal allergic rhinitis    Allergies tend to flare asthma - continue flonase and allegra during allergy season       Hyperthyroidism -  Primary    Followed by endo Loanne Drilling) now on methimazole 2.5mg  on even days of the month.  Thyroid ultrasound 2017 showed L sided thyroid nodules.  Marked tachycardia - seems to have progressed since covid illness 07/2019 - will start low dose metoprolol XL 25mg  1/2 tab daily (12.5mg ). she will monitor for hypotension although I anticipate she will do well with this given recent BP readings are trending up (?thyroid related). Update me via MyChart with beta blockade effect. RTC 6 wks f/u.  Relevant Medications   metoprolol succinate (TOPROL-XL) 25 MG 24 hr tablet   Coronary artery disease involving native coronary artery of native heart    Thought vasospasm. On statin, aspirin, imdur.       Relevant Medications   metoprolol succinate (TOPROL-XL) 25 MG 24 hr tablet   Centrilobular emphysema (HCC)    States she has asthma, but chart diagnosis of emphysema in smoking history. Latest spirometry 2015 - no post-albuterol measurements made, so difficult to determine if asthma vs copd. Continue symbicort at this time with albuterol PRN.       Relevant Medications   fexofenadine (ALLEGRA ALLERGY) 180 MG tablet   nicotine (NICOTROL) 10 MG inhaler       Meds ordered this encounter  Medications  . DISCONTD: nicotine (NICOTROL) 10 MG inhaler    Sig: Inhale 1 Cartridge (1 continuous puffing total) into the lungs as needed for smoking cessation.    Dispense:  42 each    Refill:  0  . fexofenadine (ALLEGRA ALLERGY) 180 MG tablet    Sig: Take 1 tablet (180 mg total) by mouth daily as needed for allergies or rhinitis.  Marland Kitchen nicotine (NICOTROL) 10 MG inhaler    Sig: Inhale 1 Cartridge (1 continuous puffing total) into the lungs as needed for smoking cessation.    Dispense:  42 each    Refill:  0  . metoprolol succinate (TOPROL-XL) 25 MG 24 hr tablet    Sig: Take 0.5 tablets (12.5 mg total) by mouth daily.    Dispense:  30 tablet    Refill:  1   No orders of the defined types were placed in  this encounter.   Patient Instructions  Continue methimazole. Start metoprolol XL 25mg  1/2 tablet daily to slow down the heart some.  Update me with effect.  Return in 6 weeks for follow up visit.  Return at your convenience for physical.    Follow up plan: Return in about 6 weeks (around 12/09/2019) for follow up visit.  02/08/2020, MD

## 2019-10-28 NOTE — Telephone Encounter (Signed)
Pt called wanting to know if dr g could write letter  to school stating that it would be better if her son Makayla Green 07/16/05  (dr Alphonsus Sias pt) continue to avoid face to face contact at school during EOG because of mom Cody asthma/heart diease and grandfather  Rachael Darby 04/08/45 he has lung cancer.  She stated the school call her needs from her PCP not Eliberto Ivory    Please advise when ready for pick up

## 2019-10-29 DIAGNOSIS — R000 Tachycardia, unspecified: Secondary | ICD-10-CM | POA: Insufficient documentation

## 2019-10-29 NOTE — Assessment & Plan Note (Signed)
States she has asthma, but chart diagnosis of emphysema in smoking history. Latest spirometry 2015 - no post-albuterol measurements made, so difficult to determine if asthma vs copd. Continue symbicort at this time with albuterol PRN.

## 2019-10-29 NOTE — Assessment & Plan Note (Addendum)
Followed by endo Everardo All) now on methimazole 2.5mg  on even days of the month.  Thyroid ultrasound 2017 showed L sided thyroid nodules.  Marked tachycardia - seems to have progressed since covid illness 07/2019 - will start low dose metoprolol XL 25mg  1/2 tab daily (12.5mg ). she will monitor for hypotension although I anticipate she will do well with this given recent BP readings are trending up (?thyroid related). Update me via MyChart with beta blockade effect. RTC 6 wks f/u.

## 2019-10-29 NOTE — Telephone Encounter (Signed)
Letter written and in chart 

## 2019-10-29 NOTE — Assessment & Plan Note (Deleted)
Thought vasospasm. On statin, aspirin, imdur.

## 2019-10-29 NOTE — Assessment & Plan Note (Signed)
Thought vasospasm. On statin, aspirin, imdur.  

## 2019-10-29 NOTE — Assessment & Plan Note (Signed)
Continue to encourage smoking cessation - currently in action phase using nicotine inhalers - refilled to requested pharmacy.

## 2019-10-29 NOTE — Assessment & Plan Note (Addendum)
Developed after covid illness, in setting of being treated for hyperthyroidism as well. Start low dose beta blocker (caution in h/o hypotension). Update with effect.  Will need to review caffeine intake and encourage good hydration status.

## 2019-10-29 NOTE — Assessment & Plan Note (Signed)
Allergies tend to flare asthma - continue flonase and allegra during allergy season

## 2019-10-31 NOTE — Telephone Encounter (Signed)
Printed letter, per pt request.  Placed letter at front office- yellow folders.  Fwd message to Dr. Reece Agar to address HA question.

## 2019-11-01 ENCOUNTER — Ambulatory Visit: Payer: 59 | Admitting: Family Medicine

## 2019-11-07 ENCOUNTER — Encounter: Payer: Self-pay | Admitting: Endocrinology

## 2019-11-30 ENCOUNTER — Ambulatory Visit: Payer: 59 | Admitting: Endocrinology

## 2019-12-09 ENCOUNTER — Ambulatory Visit: Payer: 59 | Admitting: Family Medicine

## 2019-12-13 ENCOUNTER — Other Ambulatory Visit: Payer: Self-pay | Admitting: Primary Care

## 2019-12-13 DIAGNOSIS — J302 Other seasonal allergic rhinitis: Secondary | ICD-10-CM

## 2020-01-06 ENCOUNTER — Encounter: Payer: Self-pay | Admitting: Family Medicine

## 2020-01-18 MED ORDER — MONTELUKAST SODIUM 10 MG PO TABS: 10.0000 mg | ORAL_TABLET | Freq: Every day | ORAL | 3 refills | Status: DC

## 2020-01-18 NOTE — Addendum Note (Signed)
Addended by: Eustaquio Boyden on: 01/18/2020 01:41 PM   Modules accepted: Orders

## 2020-01-31 MED ORDER — MONTELUKAST SODIUM 10 MG PO TABS: 10.0000 mg | ORAL_TABLET | Freq: Every day | ORAL | 11 refills | Status: DC

## 2020-01-31 NOTE — Addendum Note (Signed)
Addended by: Eustaquio Boyden on: 01/31/2020 06:04 PM   Modules accepted: Orders

## 2020-04-03 ENCOUNTER — Other Ambulatory Visit: Payer: Self-pay | Admitting: Primary Care

## 2020-04-03 NOTE — Telephone Encounter (Signed)
Symbicort refill request.  Last filled 07/07/2020. Last seen 10/28/2019.

## 2020-04-05 ENCOUNTER — Encounter: Payer: Self-pay | Admitting: Family Medicine

## 2020-04-19 ENCOUNTER — Other Ambulatory Visit: Payer: Self-pay | Admitting: Cardiology

## 2020-04-19 NOTE — Telephone Encounter (Signed)
This is Dr. Croitoru's pt 

## 2020-04-25 ENCOUNTER — Other Ambulatory Visit: Payer: Self-pay | Admitting: Cardiology

## 2020-04-25 ENCOUNTER — Telehealth: Payer: Self-pay | Admitting: Cardiovascular Disease

## 2020-04-25 NOTE — Telephone Encounter (Signed)
Refill sent. Pt made aware 

## 2020-04-25 NOTE — Telephone Encounter (Signed)
°*  STAT* If patient is at the pharmacy, call can be transferred to refill team.   1. Which medications need to be refilled? (please list name of each medication and dose if known) Isorbide   2. Which pharmacy/location (including street and city if local pharmacy) is medication to be sent to? [facility]  3. Do they need a 30 day or 90 day supply? 30

## 2020-04-27 ENCOUNTER — Encounter: Payer: Self-pay | Admitting: Family Medicine

## 2020-04-27 DIAGNOSIS — Z23 Encounter for immunization: Secondary | ICD-10-CM

## 2020-05-11 ENCOUNTER — Telehealth: Payer: Self-pay | Admitting: Allergy & Immunology

## 2020-05-11 NOTE — Telephone Encounter (Signed)
I spoke with Makayla Green. She states that her and her whole family have a long history of severe allergic reactions to vaccines/medications. She does not want any of them to receive the vaccine until they have the component testing performed. I did tell her that the testing does take a lot of time/prep work so it may or may not be possible for all of them to be tested on the same day (if you agree testing is necessary). Her family consists of her, her husband, and their 3 sons.

## 2020-05-11 NOTE — Telephone Encounter (Signed)
Can the [place] front staff please contact Makayla Green to schedule New Patient appts for all 5 family members? Thank you so much!

## 2020-05-11 NOTE — Telephone Encounter (Signed)
Hmm it sounds like to me they all just need new pt appointments so we can get the history and see who needs testing or not.  I would not guarantee that testing would be done on same visit as new pt appointment if there history of reactions to vaccines/medications is that extensive.

## 2020-05-11 NOTE — Telephone Encounter (Signed)
This lady called and need to talk to someone about the component testing for the covid. [place] Spaniel she has reaction to flu shot. [phone]

## 2020-05-13 NOTE — Addendum Note (Signed)
Addended by: Eustaquio Boyden on: 05/13/2020 01:24 PM   Modules accepted: Orders

## 2020-05-14 NOTE — Telephone Encounter (Signed)
Referrals sent to [facility].  Patient is aware -- referral sent for herself, her husband and kids.   Nothing further needed.

## 2020-05-18 ENCOUNTER — Telehealth: Payer: 59 | Admitting: Family

## 2020-05-18 DIAGNOSIS — J441 Chronic obstructive pulmonary disease with (acute) exacerbation: Secondary | ICD-10-CM

## 2020-05-18 MED ORDER — AZITHROMYCIN 250 MG PO TABS: ORAL_TABLET | ORAL | 0 refills | Status: DC

## 2020-05-18 MED ORDER — PREDNISONE 10 MG (21) PO TBPK: ORAL_TABLET | ORAL | 0 refills | Status: DC

## 2020-05-18 MED ORDER — BENZONATATE 100 MG PO CAPS: 100.0000 mg | ORAL_CAPSULE | Freq: Three times a day (TID) | ORAL | 0 refills | Status: DC | PRN

## 2020-05-18 NOTE — Progress Notes (Signed)
We are sorry that you are not feeling well.  Here is how we plan to help!  Based on your presentation I believe you most likely have A cough due to bacteria.  When patients have a fever and a productive cough with a change in color or increased sputum production, we are concerned about bacterial bronchitis.  If left untreated it can progress to pneumonia.  If your symptoms do not improve with your treatment plan it is important that you contact your provider.   I have prescribed Azithromyin 250 mg: two tablets now and then one tablet daily for 4 additonal days    In addition you may use A non-prescription cough medication called Robitussin DAC. Take 2 teaspoons every 8 hours or Delsym: take 2 teaspoons every 12 hours., A non-prescription cough medication called Mucinex DM: take 2 tablets every 12 hours. and A prescription cough medication called Tessalon Perles 100mg. You may take 1-2 capsules every 8 hours as needed for your cough.  Prednisone 10 mg daily for 6 days (see taper instructions below)  Directions for 6 day taper: Day 1: 2 tablets before breakfast, 1 after both lunch & dinner and 2 at bedtime Day 2: 1 tab before breakfast, 1 after both lunch & dinner and 2 at bedtime Day 3: 1 tab at each meal & 1 at bedtime Day 4: 1 tab at breakfast, 1 at lunch, 1 at bedtime Day 5: 1 tab at breakfast & 1 tab at bedtime Day 6: 1 tab at breakfast   From your responses in the eVisit questionnaire you describe inflammation in the upper respiratory tract which is causing a significant cough.  This is commonly called Bronchitis and has four common causes:    Allergies  Viral Infections  Acid Reflux  Bacterial Infection Allergies, viruses and acid reflux are treated by controlling symptoms or eliminating the cause. An example might be a cough caused by taking certain blood pressure medications. You stop the cough by changing the medication. Another example might be a cough caused by acid reflux.  Controlling the reflux helps control the cough.  USE OF BRONCHODILATOR ("RESCUE") INHALERS: There is a risk from using your bronchodilator too frequently.  The risk is that over-reliance on a medication which only relaxes the muscles surrounding the breathing tubes can reduce the effectiveness of medications prescribed to reduce swelling and congestion of the tubes themselves.  Although you feel brief relief from the bronchodilator inhaler, your asthma may actually be worsening with the tubes becoming more swollen and filled with mucus.  This can delay other crucial treatments, such as oral steroid medications. If you need to use a bronchodilator inhaler daily, several times per day, you should discuss this with your provider.  There are probably better treatments that could be used to keep your asthma under control.     HOME CARE . Only take medications as instructed by your medical team. . Complete the entire course of an antibiotic. . Drink plenty of fluids and get plenty of rest. . Avoid close contacts especially the very young and the elderly . Cover your mouth if you cough or cough into your sleeve. . Always remember to wash your hands . A steam or ultrasonic humidifier can help congestion.   GET HELP RIGHT AWAY IF: . You develop worsening fever. . You become short of breath . You cough up blood. . Your symptoms persist after you have completed your treatment plan MAKE SURE YOU   Understand these instructions.    Will watch your condition.  Will get help right away if you are not doing well or get worse.  Your e-visit answers were reviewed by a board certified advanced clinical practitioner to complete your personal care plan.  Depending on the condition, your plan could have included both over the counter or prescription medications. If there is a problem please reply  once you have received a response from your provider. Your safety is important to us.  If you have drug allergies  check your prescription carefully.    You can use MyChart to ask questions about today's visit, request a non-urgent call back, or ask for a work or school excuse for 24 hours related to this e-Visit. If it has been greater than 24 hours you will need to follow up with your provider, or enter a new e-Visit to address those concerns. You will get an e-mail in the next two days asking about your experience.  I hope that your e-visit has been valuable and will speed your recovery. Thank you for using e-visits.  Approximately 5 minutes was spent documenting and reviewing patient's chart.    

## 2020-05-21 NOTE — Telephone Encounter (Signed)
Noted  

## 2020-05-22 ENCOUNTER — Encounter: Payer: Self-pay | Admitting: Family Medicine

## 2020-05-22 ENCOUNTER — Other Ambulatory Visit: Payer: Self-pay

## 2020-05-22 ENCOUNTER — Telehealth: Payer: Self-pay | Admitting: Family Medicine

## 2020-05-22 ENCOUNTER — Telehealth (INDEPENDENT_AMBULATORY_CARE_PROVIDER_SITE_OTHER): Payer: 59 | Admitting: Family Medicine

## 2020-05-22 VITALS — BP 133/86 | HR 105

## 2020-05-22 DIAGNOSIS — J209 Acute bronchitis, unspecified: Secondary | ICD-10-CM | POA: Insufficient documentation

## 2020-05-22 DIAGNOSIS — F17200 Nicotine dependence, unspecified, uncomplicated: Secondary | ICD-10-CM | POA: Diagnosis not present

## 2020-05-22 DIAGNOSIS — J441 Chronic obstructive pulmonary disease with (acute) exacerbation: Secondary | ICD-10-CM

## 2020-05-22 MED ORDER — PREDNISONE 20 MG PO TABS: ORAL_TABLET | ORAL | 0 refills | Status: DC

## 2020-05-22 MED ORDER — AZITHROMYCIN 250 MG PO TABS: ORAL_TABLET | ORAL | 0 refills | Status: DC

## 2020-05-22 NOTE — Telephone Encounter (Signed)
Pt called and says she was diagnosed w/bronchitis via an E-Visit thru MyChart and was given a Z-pak, Prednisone, Tessalon Perles.  She finishes all medication today but is no better, she says she is actually a little bit worse.  She is requesting a stronger antibiotic and possibly another steroid round. Please advise.  Pt requests c/b.

## 2020-05-22 NOTE — Patient Instructions (Signed)
Drink fluids and rest  Continue nebulizer treatments  Repeat zpack  Start prednisone taper at 60 mg  Continue expectorant with lots of fluids  Update if not starting to improve in a week or if worsening  -would consider ordering outside chest xray  Also covid testing if desired

## 2020-05-22 NOTE — Telephone Encounter (Signed)
plz schedule virtual visit with available provider.

## 2020-05-22 NOTE — Assessment & Plan Note (Signed)
In pt with copd/ smoking who responded partially to tx with zpak and prednisone- req higher prednisone dose  Will refill zpak (high risk of pneumonia) , and start pred taper at higher 60 mg dose  Albuterol nmt prn  Continue expectorant with lots of water  Recommend smoking cessation (pt feels ready)  If worse or no improvement will call to req cxr at outside site Pt declines covid testing as she and husband have not left the house in 3 wk  Enc her to re consider if worse or no imp  inst to go to ER if sob

## 2020-05-22 NOTE — Assessment & Plan Note (Signed)
Pt feels ready to quit during this acute bronchitis episode and feels she can do it herself

## 2020-05-22 NOTE — Progress Notes (Signed)
Virtual Visit via Video Note  I connected with Makayla Green on 05/22/20 at  3:00 PM EDT by a video enabled telemedicine application and verified that I am speaking with the correct person using two identifiers.  Location: Patient: home Provider: office   I discussed the limitations of evaluation and management by telemedicine and the availability of in person appointments. The patient expressed understanding and agreed to proceed.  Parties involved in encounter  Patient: Makayla Green  Provider:  Roxy Manns MD   History of Present Illness: 49 yo pt of Dr Reece Agar presents with respiratory symptoms   This all started around 10/13   Feels like she has mucous stuck in her chest  Coughing all night long and it makes her sob  Taking mucinex D  She drinks water - 8 bottles per day   Phlegm is tinted yellow  Thick as well   Ears and throat are fine  Nasal d/c is tinted  Using nasacort  Has some saline -does not use often/does not help much   No fever  No loss of taste or smell   No GI symptoms  No covid contacts   Some wheezing-uses albuterol nmt  Prednisone helped initially -then came back   She took tessalon - and thought it suppressed too much    She had covid last December  Made her very tachycardic Allergies got worse  (uses hepa filters and humidifiers)    Smoking a lot less since she got sick  Planning to quit right now   She is a current smoker with h/o emphysema     Had an e visit on 10/15 for exac of copd tx with azithromycin and prednisone taper   Patient Active Problem List   Diagnosis Date Noted   Acute bronchitis 05/22/2020   Tachycardia 10/29/2019   Seasonal allergic rhinitis 11/21/2016   Coronary artery disease involving native coronary artery of native heart    Coronary arteriosclerosis in native artery 10/23/2015   Thyroid nodule 10/23/2015   Centrilobular emphysema (HCC) 07/06/2014   Encounter for routine gynecological  examination 05/11/2014   Anxiety and depression 05/11/2014   Hyperthyroidism 04/20/2014   Night sweats 03/20/2014   Contraception management 02/24/2013   TOBACCO ABUSE 10/11/2010   Past Medical History:  Diagnosis Date   Abnormal pap    LEEP 2005   Asthma    Coronary artery disease    Solitary pulmonary nodule 07/06/2014    4 mm pulmonary nodule seen on November 2015 CT chest    Tobacco use disorder    Past Surgical History:  Procedure Laterality Date   CARDIAC CATHETERIZATION N/A 11/14/2015   Procedure: Left Heart Cath and Coronary Angiography;  Surgeon: Iran Ouch, MD;  Location: [facility];  Service: Cardiovascular;  Laterality: N/A;   Social History   Tobacco Use   Smoking status: Current Every Day Smoker    Packs/day: 1.00    Years: 25.00    Pack years: 25.00    Types: Cigarettes   Smokeless tobacco: Never Used  Substance Use Topics   Alcohol use: No    Alcohol/week: 0.0 standard drinks   Drug use: No   Family History  Problem Relation Age of Onset   COPD Mother    Heart disease Mother    Cancer Mother        metastatic   Neuropathy Father    COPD Father    Lung cancer Father    Thyroid disease Neg Hx  Allergies  Allergen Reactions   Penicillins Shortness Of Breath and Swelling    Has patient had a PCN reaction causing immediate rash, facial/tongue/throat swelling, SOB or lightheadedness with hypotension: Yes Has patient had a PCN reaction causing severe rash involving mucus membranes or skin necrosis: No Has patient had a PCN reaction that required hospitalization No Has patient had a PCN reaction occurring within the last 10 years: No If all of the above answers are "NO", then may proceed with Cephalosporin use.    Amitriptyline     Hallucinations   Doxycycline    Shellfish Allergy     Patient has not eaten any shellfish and does not know if she is allergic. She has had radiology images using dye with no  reactions noted.    Current Outpatient Medications on File Prior to Visit  Medication Sig Dispense Refill   albuterol (PROVENTIL HFA;VENTOLIN HFA) 108 (90 Base) MCG/ACT inhaler Inhale into the lungs. Every 6 hours as needed     aspirin EC 81 MG tablet Take 1 tablet (81 mg total) by mouth daily. 90 tablet 3   fexofenadine (ALLEGRA ALLERGY) 180 MG tablet Take 1 tablet (180 mg total) by mouth daily as needed for allergies or rhinitis.     fluticasone (FLONASE) 50 MCG/ACT nasal spray SPRAY 2 SPRAYS INTO EACH NOSTRIL EVERY DAY 16 mL 2   isosorbide mononitrate (IMDUR) 30 MG 24 hr tablet TAKE 1/2 TABLET BY MOUTH DAILY 15 tablet 4   methimazole (TAPAZOLE) 5 MG tablet Take 0.5 tablets (2.5 mg total) by mouth daily. 30 tablet 3   metoprolol succinate (TOPROL-XL) 25 MG 24 hr tablet Take 0.5 tablets (12.5 mg total) by mouth daily. 30 tablet 1   montelukast (SINGULAIR) 10 MG tablet Take 1 tablet (10 mg total) by mouth at bedtime. 30 tablet 11   nicotine (NICOTROL) 10 MG inhaler Inhale 1 Cartridge (1 continuous puffing total) into the lungs as needed for smoking cessation. 42 each 0   rosuvastatin (CRESTOR) 5 MG tablet TAKE 1 TABLET (5 MG TOTAL) BY MOUTH DAILY AT 6 PM. 30 tablet 5   SYMBICORT 160-4.5 MCG/ACT inhaler INHALE 2 PUFFS INTO THE LUNGS DAILY 10.2 g 2   No current facility-administered medications on file prior to visit.   Review of Systems  Constitutional: Positive for malaise/fatigue. Negative for chills and fever.  HENT: Positive for congestion. Negative for ear pain, sinus pain and sore throat.   Eyes: Negative for blurred vision, discharge and redness.  Respiratory: Positive for cough, sputum production and wheezing. Negative for shortness of breath and stridor.   Cardiovascular: Negative for chest pain, palpitations and leg swelling.  Gastrointestinal: Negative for abdominal pain, diarrhea, nausea and vomiting.  Musculoskeletal: Negative for myalgias.  Skin: Negative for rash.   Neurological: Negative for dizziness and headaches.    Observations/Objective: Patient appears well, in no distress Weight is baseline  No facial swelling or asymmetry Mildly hoars voice No obvious tremor or mobility impairment Moving neck and UEs normally Able to hear the call well  Intermittent raspy cough , no audible sob or wheeze Talkative and mentally sharp with no cognitive changes No skin changes on face or neck , no rash or pallor Affect is normal    Assessment and Plan: Problem List Items Addressed This Visit      Respiratory   Acute bronchitis - Primary    In pt with copd/ smoking who responded partially to tx with zpak and prednisone- req higher prednisone dose  Will  refill zpak (high risk of pneumonia) , and start pred taper at higher 60 mg dose  Albuterol nmt prn  Continue expectorant with lots of water  Recommend smoking cessation (pt feels ready)  If worse or no improvement will call to req cxr at outside site Pt declines covid testing as she and husband have not left the house in 3 wk  Enc her to re consider if worse or no imp  inst to go to ER if sob         Other   TOBACCO ABUSE    Pt feels ready to quit during this acute bronchitis episode and feels she can do it herself           Follow Up Instructions: Drink fluids and rest  Continue nebulizer treatments  Repeat zpack  Start prednisone taper at 60 mg  Continue expectorant with lots of fluids  Update if not starting to improve in a week or if worsening  -would consider ordering outside chest xray  Also covid testing if desired   I discussed the assessment and treatment plan with the patient. The patient was provided an opportunity to ask questions and all were answered. The patient agreed with the plan and demonstrated an understanding of the instructions.   The patient was advised to call back or seek an in-person evaluation if the symptoms worsen or if the condition fails to improve as  anticipated.     Roxy Manns, MD

## 2020-05-28 ENCOUNTER — Telehealth: Payer: Self-pay | Admitting: Cardiovascular Disease

## 2020-05-28 NOTE — Telephone Encounter (Signed)
The patient has been made aware and verbalized her understanding.  

## 2020-05-28 NOTE — Telephone Encounter (Signed)
An increase in heart rate is typical with any significant infection and is not specific to COVID. It was simply an indication of a serious illness. I would recommend getting the shot.

## 2020-05-28 NOTE — Telephone Encounter (Signed)
Spoke to pt. She report having COVID last Decemeber developed symptoms of elevated HR. Pt report HR had increased to 130 at rest. Pt is considering getting the COVID vaccine and scared it will cause her HR to increase again. Pt is seeking advice from MD.   Will route to MD for review.

## 2020-05-28 NOTE — Telephone Encounter (Signed)
There is no cardiac reason for exemption.

## 2020-05-28 NOTE — Telephone Encounter (Signed)
Spoke with the patient and made her aware of the recommendation. She stated that 10 months after having covid she still has increased heart rates with ambulating heart rates in the 130's. She is scared that the covid vaccine "will tip her over".   She has been made aware of the recommendations and that ultimately it is her decision but she still wants Dr. Erin Hearing advice and if she doesn't get the vaccine if she needs a letter of exemption.

## 2020-05-28 NOTE — Telephone Encounter (Signed)
Makayla Green would like Dr. Erin Hearing advice on whether she should get the COVID vaccine with regards to her heart rate issues. Please call/advise.   Thank you!

## 2020-05-28 NOTE — Progress Notes (Signed)
New Patient Note  RE: Makayla Green MRN: [id number] DOB: [birth year] Date of Office Visit: 05/29/2020  Referring provider: Eustaquio Boyden, MD Primary care provider: Eustaquio Boyden, MD  Chief Complaint: Allergy Testing  History of Present Illness: I had the pleasure of seeing Makayla Green for initial evaluation at the [facility] on 05/30/2020. She is a 49 y.o. female, who is self-referred here for the evaluation of Covid-19 component testing.  Any known reactions to polyethylene glycol or polysorbate?  Not sure.   Any history of anaphylaxis to vaccinations? No issues with past vaccinations.  Any history of reactions to injectable medications? No issues in the past.   Any history of anaphylaxis to colonoscopy preps (i.e.Miralax)? No prior exposure.   Any history of dermal filler treatments in the last year? No.  Patient had COVID-19 infection in December 2020 - treated as outpatient, tachycardia issues with exertion still.  Patient states that her allergies worsened since she had COVID-19 and had 2 bronchitis episodes.  Drugs: Penicillin - rash, shortness of breath and swelling. Doxycycline - not sure  Asthma: She reports symptoms of chest tightness, shortness of breath, coughing, wheezing for 40+ years. Current medications include Symbicort 2 puffs twice a day x 4-5 years and albuterol prn which help. She reports not using aerochamber with inhalers. She tried the following inhalers: theophylline and other inhalers but patient can't recall. Main triggers are allergies, infections, weather changes. In the last month, frequency of symptoms: 3-5x/week. Frequency of nocturnal symptoms: 0x/month. Frequency of SABA use: 3-5x/week. Interference with physical activity: yes. In the last 12 months, emergency room visits/urgent care visits/doctor office visits or hospitalizations due to respiratory issues: televisit a few times. In the last 12  months, oral steroids courses: 5. Lifetime history of hospitalization for respiratory issues: yes as a child. History of pneumonia: yes. She was evaluated by allergist/pulmonologist in the past. Smoking exposure: yes - 1ppd x 25 years. Up to date with flu vaccine: not yet. Up to date with pneumonia vaccine: no. Up to date with COVID-19 vaccine: no.  History of reflux: yes but only takes medications if needed.  Assessment and Plan: Gaylene is a 49 y.o. female with: Drug reaction Reactions to penicillin in the past and possibly to doxycycline. No issues with past vaccinations. Concerned about getting the COVID-19 vaccine. Had COVID-19 infection.  Today's skin prick and intradermal testing was negative to Miralax (source of PEG 3350), and triamcinolone acetonide (source of polysorbate-80).  Positive to methylprednisolone acetate (source of PEG 3350).  Based on the above results, recommended that patient get the J&J COVID-19 vaccine as that does not contain any PEG product. Patient is hesitant due to her heart condition. Informed patient that the J&J vaccine should not interfere with her heart condition but she will check with her cardiologist.    Start to avoid methylprednisolone acetate for now as well.    Continue to avoid penicillin.  Consider penicillin allergy skin testing and in office drug challenge in the future.  Over 90% of people with history of penicillin allergy which occurred over 10 years ago are found to be non-allergic.  You must be off antihistamines for 3-5 days before. Plan on being in the office for 2-3 hours. You must call to schedule an appointment and specify it's for a drug challenge.  A few days prior to the appointment, I will send in a prescription for amoxicillin liquid which you must bring to the appointment as well.  Not well controlled asthma without complication History of asthma for 40+ years and required hospitalizations as a child. Noted worsening symptoms  since COVID-19 infection. Currently on Symbicort 2 puffs twice a day for 4-5 years and using albuterol 3-5 times per week. Patient is a current smoker 1 ppd x 25 years.  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 Clinically feeling better. Daily controller medication(s): continue with Symbicort 2 puffs twice a day with spacer and rinse mouth afterwards.  Spacer given and demonstrated proper use with inhaler. Patient understood technique and all questions/concerned were addressed.  Start Spiriva 2.75mcg 2 puffs daily - sample given and demonstrated proper use. Patient most likely has a component of COPD as well.  Recommend full body PFT in future with DLCO and lung volumes.  May use albuterol rescue inhaler 2 puffs every 4 to 6 hours as needed for shortness of breath, chest tightness, coughing, and wheezing. May use albuterol rescue inhaler 2 puffs 5 to 15 minutes prior to strenuous physical activities. Monitor frequency of use.   Recommend smoking cessation.  Repeat spirometry at next visit.   Other allergic rhinitis Rhinitis symptoms in the spring and fall and takes Allegra-D and Singulair.   May use over the counter antihistamines such as Zyrtec (cetirizine), Claritin (loratadine), Allegra (fexofenadine), or Xyzal (levocetirizine) daily as needed.  Continue Singulair 10mg  daily.   Recommend testing in the future.   Return in about 2 months (around 07/29/2020).  Other allergy screening: Rhino conjunctivitis: yes  Rhinitis symptoms in the spring and fall and takes Allegra-D and Singulair.  Food allergy: no Hymenoptera allergy: no Urticaria: no Eczema:no History of recurrent infections suggestive of immunodeficency: no  Diagnostics: Spirometry:  Tracings reviewed. Her effort: Good reproducible efforts. FVC: 3.23L FEV1: 1.67L, 51% predicted FEV1/FVC ratio: 52% Interpretation:  Spirometry consistent with mixed obstructive and restrictive disease with 7% improvement in FEV1 post bronchodilator treatment. Clinically feeling better. Please see scanned spirometry results for details.  Skin Testing: Results discussed with patient/family.  COVID Vaccine Testing - 05/29/20 0854      Test Information   Consent Yes    Medications Miralax    Triamcinolone Lot # 05/31/20    Triamcinolone EXP DATE 06/04/21    Methylprednisolone Lot # [id number]    Methylprednisolone EXP DATE 10/02/20    Miralax Lot # [id number]    Miralax EXP DATE 12/02/21      SKIN PRICK TESTING - Arm #1   Location Right Arm      HISTAMINE (1mg /mL) Skin Prick Arm #1   Histamine Time Testing Placed 1007    Histamine Wheal 2+      Control (negative - HSA) Skin Prick Arm #1   Control Time Testing Placed 1007    Control Wheal Negative      Triamcinolone (40mg /mL) Skin Prick Arm #1   Triamcinolone Time Testing Placed 1007    Triamcinolone Wheal Negative      Methylprednisolone (40mg /mL) Skin Prick Arm #1   Methylprednisolone Time Testing Placed 1007    Methylprednisolone Wheal Negative      Miralax (1:100 or 1.7 mg/mL) Skin Prick Arm #1   Miralax Time Testing Placed 1007    Miralax Wheal Negative      Miralax (1:10 or 17mg /mL) Skin Prick Arm #1   Miralax Time Testing Placed 1037    Miralax Wheal Negative      Miralax (1:1 or 170mg /mL) Skin Prick Arm #1  Miralax Time Testing Placed 1103    Miralax Wheal Negative      INTRADERMAL TESTING - Arm #2   Location Right Arm    Select Select      Control (negative - HSA) Intradermal Arm #2   Control Time Testing Placed  1037    Control Wheal Negative      Triamcinolone (1:100) Intradermal Arm #2   Triamcinolone Time Testing Placed 1137    Triamcinolone Wheal Negative      Methylprednisolone (1:100) Intradermal Arm #2   Methylprednisolone Time Testing Placed  1037    Methylprednisolone Wheal 2+      Triamcinolone (1:10) Intradermal Arm #2    Triamcinolone Time Testing Placed 1103    Triamcinolone Wheal Negative      Triamcinolone (1:1) Intradermal Arm #2   Triamcinolone Time Testing Placed 1130    Triamcinolone Wheal Negative           Past Medical History: Patient Active Problem List   Diagnosis Date Noted  . Drug reaction 05/29/2020  . Not well controlled asthma without complication 05/29/2020  . Acute bronchitis 05/22/2020  . Tachycardia 10/29/2019  . Other allergic rhinitis 11/21/2016  . Coronary artery disease involving native coronary artery of native heart   . Coronary arteriosclerosis in native artery 10/23/2015  . Thyroid nodule 10/23/2015  . Centrilobular emphysema (HCC) 07/06/2014  . Encounter for routine gynecological examination 05/11/2014  . Anxiety and depression 05/11/2014  . Hyperthyroidism 04/20/2014  . Night sweats 03/20/2014  . Contraception management 02/24/2013  . TOBACCO ABUSE 10/11/2010   Past Medical History:  Diagnosis Date  . Abnormal pap    LEEP 2005  . Asthma   . Coronary artery disease   . Recurrent upper respiratory infection (URI)   . Solitary pulmonary nodule 07/06/2014    4 mm pulmonary nodule seen on November 2015 CT chest   . Tobacco use disorder    Past Surgical History: Past Surgical History:  Procedure Laterality Date  . CARDIAC CATHETERIZATION N/A 11/14/2015   Procedure: Left Heart Cath and Coronary Angiography;  Surgeon: Iran Ouch, MD;  Location: [facility];  Service: Cardiovascular;  Laterality: N/A;   Medication List:  Current Outpatient Medications  Medication Sig Dispense Refill  . albuterol (PROVENTIL HFA;VENTOLIN HFA) 108 (90 Base) MCG/ACT inhaler Inhale into the lungs. Every 6 hours as needed    . aspirin EC 81 MG tablet Take 1 tablet (81 mg total) by mouth daily. 90 tablet 3  . fexofenadine (ALLEGRA ALLERGY) 180 MG tablet Take 1 tablet (180 mg total) by mouth daily as needed for allergies or rhinitis.    . fluticasone (FLONASE) 50 MCG/ACT  nasal spray SPRAY 2 SPRAYS INTO EACH NOSTRIL EVERY DAY 16 mL 2  . isosorbide mononitrate (IMDUR) 30 MG 24 hr tablet TAKE 1/2 TABLET BY MOUTH DAILY 15 tablet 4  . montelukast (SINGULAIR) 10 MG tablet Take 1 tablet (10 mg total) by mouth at bedtime. 30 tablet 11  . rosuvastatin (CRESTOR) 5 MG tablet TAKE 1 TABLET (5 MG TOTAL) BY MOUTH DAILY AT 6 PM. 30 tablet 5  . SYMBICORT 160-4.5 MCG/ACT inhaler INHALE 2 PUFFS INTO THE LUNGS DAILY 10.2 g 2  . Tiotropium Bromide Monohydrate (SPIRIVA RESPIMAT) 2.5 MCG/ACT AERS Inhale 2 puffs into the lungs daily. 4 g 2   No current facility-administered medications for this visit.   Allergies: Allergies  Allergen Reactions  . Penicillins Shortness Of Breath and Swelling    Has patient had a  PCN reaction causing immediate rash, facial/tongue/throat swelling, SOB or lightheadedness with hypotension: Yes Has patient had a PCN reaction causing severe rash involving mucus membranes or skin necrosis: No Has patient had a PCN reaction that required hospitalization No Has patient had a PCN reaction occurring within the last 10 years: No If all of the above answers are "NO", then may proceed with Cephalosporin use.   . Amitriptyline     Hallucinations  . Doxycycline   . Shellfish Allergy     Patient has not eaten any shellfish and does not know if she is allergic. She has had radiology images using dye with no reactions noted.    Social History: Social History   Socioeconomic History  . Marital status: Married    Spouse name: Not on file  . Number of children: 3  . Years of education: Not on file  . Highest education level: Not on file  Occupational History  . Not on file  Tobacco Use  . Smoking status: Current Every Day Smoker    Packs/day: 1.00    Years: 25.00    Pack years: 25.00    Types: Cigarettes  . Smokeless tobacco: Never Used  Vaping Use  . Vaping Use: Never used  Substance and Sexual Activity  . Alcohol use: No    Alcohol/week: 0.0  standard drinks  . Drug use: No  . Sexual activity: Not on file  Other Topics Concern  . Not on file  Social History Narrative   Married. Lives with husband, 2 sons and FIL   3 children, 1 grandchild.   Occ: works in Research officer, [employer].    Social Determinants of Health   Financial Resource Strain:   . Difficulty of Paying Living Expenses: Not on file  Food Insecurity:   . Worried About Programme researcher, broadcasting/film/videounning Out of Food in the Last Year: Not on file  . Ran Out of Food in the Last Year: Not on file  Transportation Needs:   . Lack of Transportation (Medical): Not on file  . Lack of Transportation (Non-Medical): Not on file  Physical Activity:   . Days of Exercise per Week: Not on file  . Minutes of Exercise per Session: Not on file  Stress:   . Feeling of Stress : Not on file  Social Connections:   . Frequency of Communication with Friends and Family: Not on file  . Frequency of Social Gatherings with Friends and Family: Not on file  . Attends Religious Services: Not on file  . Active Member of Clubs or Organizations: Not on file  . Attends BankerClub or Organization Meetings: Not on file  . Marital Status: Not on file   Lives in a house. Smoking: 1ppd x 20 years Occupation: Industrial/product designeraccountant  Environmental HistorySurveyor, minerals: Water Damage/mildew in the house: no Carpet in the family room: yes Carpet in the bedroom: no Heating: electric Cooling: central Pet: yes 7 dogs  Family History: Family History  Problem Relation Age of Onset  . COPD Mother   . Heart disease Mother   . Cancer Mother        metastatic  . Neuropathy Father   . COPD Father   . Lung cancer Father   . Thyroid disease Neg Hx    Problem                               Relation Asthma  No  Eczema                                No  Food allergy                          No  Allergic rhino conjunctivitis     No   Review of Systems  Constitutional: Negative for appetite change, chills, fever and unexpected  weight change.  HENT: Positive for congestion and rhinorrhea.   Eyes: Negative for itching.  Respiratory: Positive for cough, chest tightness, shortness of breath and wheezing.   Cardiovascular: Negative for chest pain.  Gastrointestinal: Negative for abdominal pain.  Genitourinary: Negative for difficulty urinating.  Skin: Negative for rash.  Neurological: Negative for headaches.   Objective: BP 118/82   Pulse 99   Temp (!) 96.4 F (35.8 C) (Temporal)   Resp 17   Ht 5\' 8"  (1.727 m)   Wt 153 lb 12.8 oz (69.8 kg)   SpO2 97%   BMI 23.39 kg/m  Body mass index is 23.39 kg/m. Physical Exam Vitals and nursing note reviewed.  Constitutional:      Appearance: Normal appearance. She is well-developed.  HENT:     Head: Normocephalic and atraumatic.     Right Ear: External ear normal.     Left Ear: External ear normal.     Nose:     Comments: Dry nasal mucosa    Mouth/Throat:     Comments: Upper dentures present - noted some whitish spots on upper palate where the dentures end.  Eyes:     Conjunctiva/sclera: Conjunctivae normal.  Cardiovascular:     Rate and Rhythm: Normal rate and regular rhythm.     Heart sounds: Normal heart sounds. No murmur heard.  No friction rub. No gallop.   Pulmonary:     Effort: Pulmonary effort is normal.     Breath sounds: Normal breath sounds. No wheezing, rhonchi or rales.  Musculoskeletal:     Cervical back: Neck supple.  Skin:    General: Skin is warm.     Findings: No rash.  Neurological:     Mental Status: She is alert and oriented to person, place, and time.  Psychiatric:        Behavior: Behavior normal.    The plan was reviewed with the patient/family, and all questions/concerned were addressed.  It was my pleasure to see Kimberely today and participate in her care. Please feel free to contact me with any questions or concerns.  Sincerely,  , DO Allergy & Immunology  [facility]  office: [phone] [facility] office: [phone]

## 2020-05-29 ENCOUNTER — Ambulatory Visit: Payer: 59 | Admitting: Allergy

## 2020-05-29 ENCOUNTER — Telehealth: Payer: Self-pay | Admitting: Cardiovascular Disease

## 2020-05-29 ENCOUNTER — Encounter: Payer: Self-pay | Admitting: Allergy

## 2020-05-29 ENCOUNTER — Other Ambulatory Visit: Payer: Self-pay

## 2020-05-29 VITALS — BP 118/82 | HR 99 | Temp 96.4°F | Resp 17 | Ht 68.0 in | Wt 153.8 lb

## 2020-05-29 DIAGNOSIS — J3089 Other allergic rhinitis: Secondary | ICD-10-CM | POA: Diagnosis not present

## 2020-05-29 DIAGNOSIS — T50905A Adverse effect of unspecified drugs, medicaments and biological substances, initial encounter: Secondary | ICD-10-CM | POA: Insufficient documentation

## 2020-05-29 DIAGNOSIS — J45909 Unspecified asthma, uncomplicated: Secondary | ICD-10-CM | POA: Diagnosis not present

## 2020-05-29 DIAGNOSIS — Z720 Tobacco use: Secondary | ICD-10-CM | POA: Diagnosis not present

## 2020-05-29 DIAGNOSIS — J45902 Unspecified asthma with status asthmaticus: Secondary | ICD-10-CM

## 2020-05-29 DIAGNOSIS — T50905D Adverse effect of unspecified drugs, medicaments and biological substances, subsequent encounter: Secondary | ICD-10-CM

## 2020-05-29 DIAGNOSIS — T50B95D Adverse effect of other viral vaccines, subsequent encounter: Secondary | ICD-10-CM

## 2020-05-29 MED ORDER — SPIRIVA RESPIMAT 2.5 MCG/ACT IN AERS: 2.0000 | INHALATION_SPRAY | Freq: Every day | RESPIRATORY_TRACT | 2 refills | Status: DC

## 2020-05-29 NOTE — Assessment & Plan Note (Signed)
Rhinitis symptoms in the spring and fall and takes Allegra-D and Singulair.   May use over the counter antihistamines such as Zyrtec (cetirizine), Claritin (loratadine), Allegra (fexofenadine), or Xyzal (levocetirizine) daily as needed.  Continue Singulair 10mg  daily.   Recommend testing in the future.

## 2020-05-29 NOTE — Telephone Encounter (Signed)
     Pt would like to speak with Dr. Erin Hearing nurse

## 2020-05-29 NOTE — Assessment & Plan Note (Addendum)
History of asthma for 40+ years and required hospitalizations as a child. Noted worsening symptoms since COVID-19 infection. Currently on Symbicort 2 puffs twice a day for 4-5 years and using albuterol 3-5 times per week. Patient is a current smoker 1 ppd x 25 years.  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 Clinically feeling better. Daily controller medication(s): continue with Symbicort 2 puffs twice a day with spacer and rinse mouth afterwards.  Spacer given and demonstrated proper use with inhaler. Patient understood technique and all questions/concerned were addressed.  Start Spiriva 2.59mcg 2 puffs daily - sample given and demonstrated proper use. Patient most likely has a component of COPD as well.  Recommend full body PFT in future with DLCO and lung volumes.  May use albuterol rescue inhaler 2 puffs every 4 to 6 hours as needed for shortness of breath, chest tightness, coughing, and wheezing. May use albuterol rescue inhaler 2 puffs 5 to 15 minutes prior to strenuous physical activities. Monitor frequency of use.   Recommend smoking cessation.  Repeat spirometry at next visit.

## 2020-05-29 NOTE — Patient Instructions (Addendum)
Vaccine:  Today's skin prick and intradermal testing was negative to Miralax (source of PEG 3350), and triamcinolone acetonide (source of polysorbate-80).  Positive to methylprednisolone acetate (source of PEG 3350).  Recommend getting the COVID-19 vaccine - J&J  Your heart condition should not interfere with the J&J vaccine but you can check with your cardiologist.  Here's more information:  [email or web address]  Drug allergy:  Continue to avoid penicillin.   Start to avoid methylprednisolone acetate for now as well.    Consider penicillin allergy skin testing and in office drug challenge in the future.  Over 90% of people with history of penicillin allergy which occurred over 10 years ago are found to be non-allergic.  You must be off antihistamines for 3-5 days before. Plan on being in the office for 2-3 hours. You must call to schedule an appointment and specify it's for a drug challenge.  A few days prior to the appointment, I will send in a prescription for amoxicillin liquid which you must bring to the appointment as well.   Environmental allergies:  May use over the counter antihistamines such as Zyrtec (cetirizine), Claritin (loratadine), Allegra (fexofenadine), or Xyzal (levocetirizine) daily as needed.  Continue Singulair 10mg  daily.   Recommend testing in the future.   Asthma:  Daily controller medication(s): continue with Symbicort 2 puffs twice a day with spacer and rinse mouth afterwards.  Spacer given and demonstrated proper use with inhaler. Patient understood technique and all questions/concerned were addressed.  Start Spiriva 2.63mcg 2 puffs daily - sample given and demonstrated proper use.  May use albuterol rescue inhaler 2 puffs every 4 to 6 hours as needed for shortness of breath, chest tightness, coughing, and wheezing. May use albuterol rescue inhaler 2 puffs 5 to 15 minutes prior to strenuous physical activities. Monitor frequency of  use.  Asthma control goals:  Full participation in all desired activities (may need albuterol before activity) Albuterol use two times or less a week on average (not counting use with activity) Cough interfering with sleep two times or less a month Oral steroids no more than once a year No hospitalizations   Recommend quitting smoking. Follow up in 2 months for asthma check.

## 2020-05-29 NOTE — Telephone Encounter (Signed)
Returned the call to patient. She stated that she did an allergy test and it showed that she had an allergy to methylprednisolone acetate(source of PEG 3350).  She wants a note from Dr. Royann Shivers stating this and that she cannot have the covid vaccine. She has been advised that the allergist has recommended the J&J vaccine according to the note in epic.   Recommend getting the COVID-19 vaccine - J&J  Your heart condition should not interfere with the J&J vaccine but you can check with your cardiologist.  She stated that she would still like a letter of exemption. She has been advised that she will need to get this from her PCP because the allergist has stated that she can still get the J&J and that from a cardiac standpoint she was okay to get the vaccine.   She would like for Dr. Erin Hearing recommendations on the J&J vaccine.

## 2020-05-29 NOTE — Assessment & Plan Note (Addendum)
Reactions to penicillin in the past and possibly to doxycycline. No issues with past vaccinations. Concerned about getting the COVID-19 vaccine. Had COVID-19 infection.  Today's skin prick and intradermal testing was negative to Miralax (source of PEG 3350), and triamcinolone acetonide (source of polysorbate-80).  Positive to methylprednisolone acetate (source of PEG 3350).  Based on the above results, recommended that patient get the J&J COVID-19 vaccine as that does not contain any PEG product. Patient is hesitant due to her heart condition. Informed patient that the J&J vaccine should not interfere with her heart condition but she will check with her cardiologist.    Start to avoid methylprednisolone acetate for now as well.    Continue to avoid penicillin.  Consider penicillin allergy skin testing and in office drug challenge in the future.  Over 90% of people with history of penicillin allergy which occurred over 10 years ago are found to be non-allergic.  You must be off antihistamines for 3-5 days before. Plan on being in the office for 2-3 hours. You must call to schedule an appointment and specify it's for a drug challenge.  A few days prior to the appointment, I will send in a prescription for amoxicillin liquid which you must bring to the appointment as well.

## 2020-06-01 ENCOUNTER — Encounter: Payer: Self-pay | Admitting: Family Medicine

## 2020-06-01 ENCOUNTER — Ambulatory Visit: Payer: 59 | Admitting: Family Medicine

## 2020-06-01 ENCOUNTER — Other Ambulatory Visit: Payer: Self-pay

## 2020-06-01 VITALS — BP 120/68 | HR 116 | Temp 97.9°F | Ht 68.0 in | Wt 153.4 lb

## 2020-06-01 DIAGNOSIS — J432 Centrilobular emphysema: Secondary | ICD-10-CM

## 2020-06-01 DIAGNOSIS — Z888 Allergy status to other drugs, medicaments and biological substances status: Secondary | ICD-10-CM | POA: Insufficient documentation

## 2020-06-01 DIAGNOSIS — Z23 Encounter for immunization: Secondary | ICD-10-CM | POA: Diagnosis not present

## 2020-06-01 DIAGNOSIS — Z2821 Immunization not carried out because of patient refusal: Secondary | ICD-10-CM

## 2020-06-01 DIAGNOSIS — R000 Tachycardia, unspecified: Secondary | ICD-10-CM | POA: Diagnosis not present

## 2020-06-01 NOTE — Assessment & Plan Note (Addendum)
Doing well with addition of spiriva respimat.  Will return for pneumovax.

## 2020-06-01 NOTE — Progress Notes (Signed)
This visit was conducted in person.  BP 120/68 (BP Location: Left Arm, Patient Position: Sitting, Cuff Size: Normal)   Pulse (!) 116   Temp 97.9 F (36.6 C) (Temporal)   Ht 5\' 8"  (1.727 m)   Wt 153 lb 6 oz (69.6 kg)   LMP  (Within Years) Comment: About 14 yrs ago  SpO2 98%   BMI 23.32 kg/m    CC: allergy f/u visit  Subjective:    Patient ID: , female    DOB: 1971-06-20, 49 y.o.   MRN: [id number]  HPI: Makayla Green is a 49 y.o. female presenting on 06/01/2020 for Follow-up (Here for allergy visit f/u.)   Saw Dr 06/03/2020 2 wks ago - dx acute bronchitis treated with prednisone taper and 2nd zpack course with benefit.   Seen by allergist earlier this week - per their note:   Today's skin prick and intradermal testing was negative toMiralax (source of PEG 3350), and triamcinolone acetonide(source of polysorbate-80).  Positive to methylprednisolone acetate(source of PEG 3350).  Based on the above results, recommended that patient get the J&J COVID-19 vaccine as that does not contain any PEG product. Patient is hesitant due to her heart condition. Informed patient that the J&J vaccine should not interfere with her heart condition but she will check with her cardiologist.   Start to avoid methylprednisolone acetate for now as well.    Continue to avoid penicillin.   I have added methylprednisolone acetate to allergy list.   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 Started on spiriva respimat in addition to symbicort. Currently using albuterol inhaler BID PRN - none in the last 2 days.  Planned 2 mo allergist f/u visit. Planning to redo PFTs.   COVID-19 infection 07/2019 - with residual tachycardia with exertion.   Had flu shot today.  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      Relevant past medical, surgical, family and social history reviewed and updated as indicated. Interim medical history since our last visit reviewed. Allergies and medications reviewed and updated. Outpatient Medications Prior to Visit  Medication Sig Dispense Refill  . albuterol (PROVENTIL HFA;VENTOLIN HFA) 108 (90 Base) MCG/ACT inhaler Inhale into the lungs. Every 6 hours as needed    . aspirin EC 81 MG tablet Take 1 tablet (81 mg total) by mouth daily. 90 tablet 3  . fexofenadine (ALLEGRA ALLERGY) 180 MG tablet Take 1 tablet (180 mg total) by mouth daily as needed for allergies or rhinitis.    . fluticasone (FLONASE) 50 MCG/ACT nasal spray SPRAY 2 SPRAYS INTO EACH NOSTRIL EVERY DAY 16 mL 2  . isosorbide mononitrate (IMDUR) 30 MG 24 hr tablet TAKE 1/2 TABLET BY MOUTH DAILY 15 tablet 4  . montelukast (SINGULAIR) 10 MG tablet Take 1 tablet (10 mg total) by mouth at bedtime. 30 tablet 11  . rosuvastatin (CRESTOR) 5 MG tablet TAKE 1 TABLET (5 MG TOTAL) BY MOUTH DAILY AT 6 PM. 30 tablet 5  . SYMBICORT 160-4.5 MCG/ACT inhaler INHALE 2 PUFFS INTO THE LUNGS DAILY 10.2 g 2  . Tiotropium Bromide Monohydrate (SPIRIVA RESPIMAT) 2.5 MCG/ACT AERS Inhale 2 puffs into the lungs daily. 4 g 2   No facility-administered medications prior to visit.     Per  HPI unless specifically indicated in ROS section below Review of Systems Objective:  BP 120/68 (BP Location: Left Arm, Patient Position: Sitting, Cuff Size: Normal)   Pulse (!) 116   Temp 97.9 F (36.6 C) (Temporal)   Ht 5\' 8"  (1.727 m)   Wt 153 lb 6 oz (69.6 kg)   LMP  (Within Years) Comment: About 14 yrs ago  SpO2 98%   BMI 23.32 kg/m   Wt Readings from Last 3 Encounters:  06/01/20 153 lb 6 oz (69.6 kg)  05/29/20 153 lb 12.8 oz (69.8 kg)  10/28/19 152 lb 4 oz (69.1 kg)      Physical Exam Vitals and nursing note reviewed.  Constitutional:       Appearance: Normal appearance. She is not ill-appearing.  Cardiovascular:     Rate and Rhythm: Normal rate and regular rhythm.     Pulses: Normal pulses.     Heart sounds: Normal heart sounds. No murmur heard.   Pulmonary:     Effort: Pulmonary effort is normal. No respiratory distress.     Breath sounds: Normal breath sounds. No wheezing, rhonchi or rales.  Skin:    General: Skin is warm and dry.     Findings: No rash.  Neurological:     Mental Status: She is alert.  Psychiatric:        Mood and Affect: Mood normal.        Behavior: Behavior normal.       Results for orders placed or performed in visit on 10/26/19  TSH  Result Value Ref Range   TSH 0.72 0.35 - 4.50 uIU/mL  T4, free  Result Value Ref Range   Free T4 1.08 0.60 - 1.60 ng/dL   Assessment & Plan:  This visit occurred during the SARS-CoV-2 public health emergency.  Safety protocols were in place, including screening questions prior to the visit, additional usage of staff PPE, and extensive cleaning of exam room while observing appropriate contact time as indicated for disinfecting solutions.   Problem List Items Addressed This Visit    Tachycardia    Has post-COVID inappropriate sinus tachycardia sequelae. Sees cards.       COVID-19 vaccine dose declined - Primary    Reviewed indications and hesitancy for COVID vaccine. She should avoid 10/28/19. 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 Reviewed risks of COVID infection including sequelae in my opinion outweigh risk of vaccine side effect but she remains hesitant. Will provide letter for work discussing need to avoid mRNA vaccines at this time.       Centrilobular emphysema (HCC)    Doing well with addition of spiriva respimat.  Will return for pneumovax.       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         Other Visit Diagnoses    Need for influenza vaccination       Relevant Orders   Flu Vaccine QUAD 36+ mos IM (Completed)       No orders of the defined types were placed in this encounter.  Orders Placed This Encounter  Procedures  . Flu Vaccine QUAD 36+ mos IM    Patient Instructions  Flu shot today.  Letter provided today.  Continue watching heart rate - ensure staying well hydrated.  Schedule nurse visit in 1 month for pneumonia shot (pneuvmoax 23).   Follow up plan: Return  in about 3 months (around 09/01/2020) for annual exam, prior fasting for blood work.  Eustaquio Boyden, MD

## 2020-06-01 NOTE — Patient Instructions (Addendum)
Flu shot today.  Letter provided today.  Continue watching heart rate - ensure staying well hydrated.  Schedule nurse visit in 1 month for pneumonia shot (pneuvmoax 23).

## 2020-06-01 NOTE — Assessment & Plan Note (Signed)
Has post-COVID inappropriate sinus tachycardia sequelae. Sees cards.

## 2020-06-01 NOTE — Assessment & Plan Note (Signed)
Reviewed indications and hesitancy for COVID vaccine. 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 Reviewed risks of COVID infection including sequelae in my opinion outweigh risk of vaccine side effect but she remains hesitant. Will provide letter for work discussing need to avoid mRNA vaccines at this time.

## 2020-06-01 NOTE — Assessment & Plan Note (Signed)
Tested positive at recent allergist eval.

## 2020-06-05 NOTE — Telephone Encounter (Signed)
Patient received a letter from PCP. Please see epic for details.

## 2020-06-19 ENCOUNTER — Telehealth: Payer: Self-pay | Admitting: Cardiovascular Disease

## 2020-06-19 NOTE — Telephone Encounter (Signed)
Addressed via MyChart message

## 2020-06-19 NOTE — Telephone Encounter (Signed)
New Message:      Pt said her priary doctor wants her to see Dr C before her January appt. He wants hher to be seen, so it can be determined whether she should tke the [employer] Vaccine for COVID 19. I offered pt a Video with Wynema Birch on 06-26-20. She said if Dr C could do a Video Visit with her or squueze her in one of his office days she would appreciate it.

## 2020-07-12 ENCOUNTER — Ambulatory Visit: Payer: 59

## 2020-07-21 ENCOUNTER — Other Ambulatory Visit: Payer: Self-pay | Admitting: Family Medicine

## 2020-07-23 NOTE — Telephone Encounter (Signed)
Pharmacy requests refill on: Symbicort 160-4.5 mcg inhaler  LAST REFILL: 04/04/2020 (Q-10.2 g, R-2) LAST OV: 06/01/2020 NEXT OV: Not Scheduled  PHARMACY: [id number] [place], [place]

## 2020-07-31 ENCOUNTER — Encounter: Payer: Self-pay | Admitting: Allergy

## 2020-07-31 ENCOUNTER — Other Ambulatory Visit: Payer: Self-pay

## 2020-07-31 ENCOUNTER — Ambulatory Visit (INDEPENDENT_AMBULATORY_CARE_PROVIDER_SITE_OTHER): Payer: 59 | Admitting: Allergy

## 2020-07-31 VITALS — BP 102/60 | HR 103 | Temp 97.7°F | Resp 16

## 2020-07-31 DIAGNOSIS — J3089 Other allergic rhinitis: Secondary | ICD-10-CM | POA: Diagnosis not present

## 2020-07-31 DIAGNOSIS — J019 Acute sinusitis, unspecified: Secondary | ICD-10-CM | POA: Diagnosis not present

## 2020-07-31 DIAGNOSIS — J4550 Severe persistent asthma, uncomplicated: Secondary | ICD-10-CM

## 2020-07-31 DIAGNOSIS — T50905D Adverse effect of unspecified drugs, medicaments and biological substances, subsequent encounter: Secondary | ICD-10-CM | POA: Diagnosis not present

## 2020-07-31 MED ORDER — PREDNISONE 20 MG PO TABS: ORAL_TABLET | ORAL | 0 refills | Status: DC

## 2020-07-31 MED ORDER — MOXIFLOXACIN HCL 400 MG PO TABS: 400.0000 mg | ORAL_TABLET | Freq: Every day | ORAL | 0 refills | Status: DC

## 2020-07-31 NOTE — Assessment & Plan Note (Signed)
A few weeks history of increased nasal congestion with mucous.  Start prednisone 20mg  tablets:  Take 3 pills once daily by mouth for 3 days, then 2 pills once daily for 3 days, then 1 pill once daily for 3 days and then stop  If not felling better, start Avelox 400mg  once a day for 10 days. Side effects discussed.  Patient can't take penicillins, doxycycline and not sure if ever had cephalosporins.

## 2020-07-31 NOTE — Progress Notes (Signed)
Follow Up Note  RE: Makayla Green MRN: [id number] DOB: 1970-11-04 Date of Office Visit: 07/31/2020  Referring provider: Eustaquio Boyden, MD Primary care provider: Eustaquio Boyden, MD  Chief Complaint: Cough (For a few weeks )  History of Present Illness: I had the pleasure of seeing Makayla Green for a follow up visit at the [facility] on 07/31/2020. She is a 49 y.o. female, who is being followed for adverse drug reactions, asthma, allergic rhinitis. Her previous allergy office visit was on 05/29/2020 with Dr. Selena Batten. Today is a regular follow up visit.  Drug reaction Patient did not get COVID-19 vaccination.   Asthma/sinus: A few weeks ago patient started noticing worsening sinus symptoms and breathing issues. She had another dog brought to her house during this time which she thinks flared her symptoms. She has 9 dogs currently.   Currently taking Mucinex, Flonase 2 sprays per nostril twice a day - no nosebleeds, Singulair, Allegra daily. Blowing out clear/yellowish mucous discharge and her drainage has a "sulfur" like taste when she gets sinus infections.   Coughing with some mucous. Currently on Symbicort 2 puffs twice a day and Spiriva 2.30mcg 2 puffs once a day with good benefit. No albuterol/nebulizer machine use. Smoking about 1/2 pack per day which is less than before.   Last course of prednisone/zpak was a few months ago.  Not sure if she ever had cephalosporins.   Assessment and Plan: Makayla Green is a 49 y.o. female with: Severe persistent asthma without complication Past history - asthma for 40+ years and required hospitalizations as a child. Noted worsening symptoms since COVID-19 infection. Currently on Symbicort 2 puffs twice a day for 4-5 years and using albuterol 3-5 times per week. Patient is a current smoker 1 ppd x 25 years. 2021 spirometry showed: mixed obstructive and restrictive disease with 7% improvement in FEV1 post  bronchodilator treatment. Clinically feeling better. Interim history - Spiriva is helping. Decreased smoking to 1/2pack per day. 2 weeks of worsening coughing/sinus issues. Not using albuterol at all.   Daily controller medication(s):continue with Symbicort 2 puffs twice a day with spacer and rinse mouth afterwards.  Continue Spiriva 2.57mcg 2 puffs daily.  May use albuterol rescue inhaler 2 puffs every 4 to 6 hours as needed for shortness of breath, chest tightness, coughing, and wheezing. May use albuterol rescue inhaler 2 puffs 5 to 15 minutes prior to strenuous physical activities. Monitor frequency of use.   Recommend smoking cessation.  Get spirometry at next visit.   Acute sinusitis A few weeks history of increased nasal congestion with mucous.  Start prednisone 20mg  tablets:  Take 3 pills once daily by mouth for 3 days, then 2 pills once daily for 3 days, then 1 pill once daily for 3 days and then stop  If not felling better, start Avelox 400mg  once a day for 10 days. Side effects discussed.  Patient can't take penicillins, doxycycline and not sure if ever had cephalosporins.   Other allergic rhinitis Past history - Rhinitis symptoms in the spring and fall and takes Allegra-D and Singulair.   May use over the counter antihistamines such as Zyrtec (cetirizine), Claritin (loratadine), Allegra (fexofenadine), or Xyzal (levocetirizine) daily as needed.  Continue Singulair 10mg  daily.   May use Flonase (fluticasone) nasal spray 1 spray per nostril twice a day as needed for nasal congestion.   Nasal saline spray (i.e., Simply Saline) or nasal saline lavage (i.e., NeilMed) is recommended as needed and  prior to medicated nasal sprays.  Recommend testing in the future.   Drug reaction Past history - Reactions to penicillin in the past and possibly to doxycycline. No issues with past vaccinations. Concerned about getting the COVID-19 vaccine. Had COVID-19 infection. 2021  skin prick and intradermal testing was negative to Miralax (source of PEG 3350), and triamcinolone acetonide (source of polysorbate-80). Positive to methylprednisolone acetate (source of PEG 3350). Interim history - patient still hesitant about getting the COVID-19 vaccine.  Continue to methylprednisolone acetate.  Okay with oral prednisone.   Continue to avoid penicillin.   Consider penicillin allergy skin testing and in office drug challenge in the future.   Return in about 2 months (around 10/01/2020).  Meds ordered this encounter  Medications  . predniSONE (DELTASONE) 20 MG tablet    Sig: Take 3 pills once daily by mouth for 3 days, then 2 pills once daily for 3 days, then 1 pill once daily for 3 days and then stop    Dispense:  18 tablet    Refill:  0  . moxifloxacin (AVELOX) 400 MG tablet    Sig: Take 1 tablet (400 mg total) by mouth daily at 8 pm.    Dispense:  10 tablet    Refill:  0   Diagnostics: None.  Medication List:  Current Outpatient Medications  Medication Sig Dispense Refill  . albuterol (PROVENTIL HFA;VENTOLIN HFA) 108 (90 Base) MCG/ACT inhaler Inhale into the lungs. Every 6 hours as needed    . aspirin EC 81 MG tablet Take 1 tablet (81 mg total) by mouth daily. 90 tablet 3  . fexofenadine (ALLEGRA ALLERGY) 180 MG tablet Take 1 tablet (180 mg total) by mouth daily as needed for allergies or rhinitis.    . fluticasone (FLONASE) 50 MCG/ACT nasal spray SPRAY 2 SPRAYS INTO EACH NOSTRIL EVERY DAY 16 mL 2  . isosorbide mononitrate (IMDUR) 30 MG 24 hr tablet TAKE 1/2 TABLET BY MOUTH DAILY 15 tablet 4  . montelukast (SINGULAIR) 10 MG tablet Take 1 tablet (10 mg total) by mouth at bedtime. 30 tablet 11  . moxifloxacin (AVELOX) 400 MG tablet Take 1 tablet (400 mg total) by mouth daily at 8 pm. 10 tablet 0  . predniSONE (DELTASONE) 20 MG tablet Take 3 pills once daily by mouth for 3 days, then 2 pills once daily for 3 days, then 1 pill once daily for 3 days and then stop  18 tablet 0  . SYMBICORT 160-4.5 MCG/ACT inhaler INHALE 2 PUFFS INTO THE LUNG EVERY DAY 10.2 each 1  . Tiotropium Bromide Monohydrate (SPIRIVA RESPIMAT) 2.5 MCG/ACT AERS Inhale 2 puffs into the lungs daily. 4 g 2  . rosuvastatin (CRESTOR) 5 MG tablet TAKE 1 TABLET (5 MG TOTAL) BY MOUTH DAILY AT 6 PM. (Patient not taking: Reported on 07/31/2020) 30 tablet 5   No current facility-administered medications for this visit.   Allergies: Allergies  Allergen Reactions  . Penicillins Shortness Of Breath and Swelling    Has patient had a PCN reaction causing immediate rash, facial/tongue/throat swelling, SOB or lightheadedness with hypotension: Yes Has patient had a PCN reaction causing severe rash involving mucus membranes or skin necrosis: No Has patient had a PCN reaction that required hospitalization No Has patient had a PCN reaction occurring within the last 10 years: No If all of the above answers are "NO", then may proceed with Cephalosporin use.   . Amitriptyline     Hallucinations  . Doxycycline   . Methylprednisolone Acetate Other (  See Comments)    Positive reaction to skin prick/intradermal testing by allergist  . Shellfish Allergy     Patient has not eaten any shellfish and does not know if she is allergic. She has had radiology images using dye with no reactions noted.    I reviewed her past medical history, social history, family history, and environmental history and no significant changes have been reported from her previous visit.  Review of Systems  Constitutional: Negative for appetite change, chills, fever and unexpected weight change.  HENT: Positive for congestion and rhinorrhea.   Eyes: Negative for itching.  Respiratory: Positive for cough, chest tightness and shortness of breath. Negative for wheezing.   Cardiovascular: Negative for chest pain.  Gastrointestinal: Negative for abdominal pain.  Genitourinary: Negative for difficulty urinating.  Skin: Negative for rash.   Neurological: Negative for headaches.   Objective: BP 102/60   Pulse (!) 103   Temp 97.7 F (36.5 C) (Temporal)   Resp 16   SpO2 97%  There is no height or weight on file to calculate BMI. Physical Exam Vitals and nursing note reviewed.  Constitutional:      Appearance: Normal appearance. She is well-developed.  HENT:     Head: Normocephalic and atraumatic.     Right Ear: Tympanic membrane and external ear normal.     Left Ear: Tympanic membrane and external ear normal.     Nose: Congestion and rhinorrhea present.     Comments: On right side    Mouth/Throat:     Mouth: Mucous membranes are moist.     Pharynx: Oropharynx is clear.     Comments: Upper dentures present. Eyes:     Conjunctiva/sclera: Conjunctivae normal.  Cardiovascular:     Rate and Rhythm: Normal rate and regular rhythm.     Heart sounds: Normal heart sounds. No murmur heard. No friction rub. No gallop.   Pulmonary:     Effort: Pulmonary effort is normal.     Breath sounds: Normal breath sounds. No wheezing, rhonchi or rales.  Musculoskeletal:     Cervical back: Neck supple.  Skin:    General: Skin is warm.     Findings: No rash.  Neurological:     Mental Status: She is alert and oriented to person, place, and time.  Psychiatric:        Behavior: Behavior normal.    Previous notes and tests were reviewed. The plan was reviewed with the patient/family, and all questions/concerned were addressed.  It was my pleasure to see Brandey today and participate in her care. Please feel free to contact me with any questions or concerns.  Sincerely,  Wyline Mood, DO Allergy & Immunology  [facility] office: [phone] [facility] office: [phone]

## 2020-07-31 NOTE — Assessment & Plan Note (Addendum)
Past history - Reactions to penicillin in the past and possibly to doxycycline. No issues with past vaccinations. Concerned about getting the COVID-19 vaccine. Had COVID-19 infection. 2021 skin prick and intradermal testing was negative to Miralax (source of PEG 3350), and triamcinolone acetonide (source of polysorbate-80). Positive to methylprednisolone acetate (source of PEG 3350). Interim history - patient still hesitant about getting the COVID-19 vaccine.  Continue to methylprednisolone acetate.  Okay with oral prednisone.   Continue to avoid penicillin.   Consider penicillin allergy skin testing and in office drug challenge in the future.

## 2020-07-31 NOTE — Assessment & Plan Note (Signed)
Past history - Rhinitis symptoms in the spring and fall and takes Allegra-D and Singulair.   May use over the counter antihistamines such as Zyrtec (cetirizine), Claritin (loratadine), Allegra (fexofenadine), or Xyzal (levocetirizine) daily as needed.  Continue Singulair 10mg  daily.   May use Flonase (fluticasone) nasal spray 1 spray per nostril twice a day as needed for nasal congestion.   Nasal saline spray (i.e., Simply Saline) or nasal saline lavage (i.e., NeilMed) is recommended as needed and prior to medicated nasal sprays.  Recommend testing in the future.

## 2020-07-31 NOTE — Assessment & Plan Note (Signed)
Past history - asthma for 40+ years and required hospitalizations as a child. Noted worsening symptoms since COVID-19 infection. Currently on Symbicort 2 puffs twice a day for 4-5 years and using albuterol 3-5 times per week. Patient is a current smoker 1 ppd x 25 years. 2021 spirometry showed: mixed obstructive and restrictive disease with 7% improvement in FEV1 post bronchodilator treatment. Clinically feeling better. Interim history - Spiriva is helping. Decreased smoking to 1/2pack per day. 2 weeks of worsening coughing/sinus issues. Not using albuterol at all.   Daily controller medication(s):continue with Symbicort 2 puffs twice a day with spacer and rinse mouth afterwards.  Continue Spiriva 2.58mcg 2 puffs daily.  May use albuterol rescue inhaler 2 puffs every 4 to 6 hours as needed for shortness of breath, chest tightness, coughing, and wheezing. May use albuterol rescue inhaler 2 puffs 5 to 15 minutes prior to strenuous physical activities. Monitor frequency of use.   Recommend smoking cessation.  Get spirometry at next visit.

## 2020-07-31 NOTE — Patient Instructions (Addendum)
Asthma:   Start prednisone 20mg  tablets:  Take 3 pills once daily by mouth for 3 days, then 2 pills once daily for 3 days, then 1 pill once daily for 3 days and then stop  If not felling better, start Avelox 400mg  once a day for 10 days - side effect to look out for is tendonitis  Daily controller medication(s):continue with Symbicort 2 puffs twice a day with spacer and rinse mouth afterwards.  Continue Spiriva 2.55mcg 2 puffs daily.  May use albuterol rescue inhaler 2 puffs every 4 to 6 hours as needed for shortness of breath, chest tightness, coughing, and wheezing. May use albuterol rescue inhaler 2 puffs 5 to 15 minutes prior to strenuous physical activities. Monitor frequency of use.   Recommend smoking cessation.  Other allergic rhinitis  May use over the counter antihistamines such as Zyrtec (cetirizine), Claritin (loratadine), Allegra (fexofenadine), or Xyzal (levocetirizine) daily as needed.  Continue Singulair 10mg  daily.   May use Flonase (fluticasone) nasal spray 1 spray per nostril twice a day as needed for nasal congestion.   Nasal saline spray (i.e., Simply Saline) or nasal saline lavage (i.e., NeilMed) is recommended as needed and prior to medicated nasal sprays.  Recommend testing in the future.   Drug reaction  Continue to avoid penicillin.   Consider penicillin allergy skin testing and in office drug challenge in the future.   Over 90% of people with history of penicillin allergy which occurred over 10 years ago are found to be non-allergic.   You must be off antihistamines for 3-5 days before. Plan on being in the office for 2-3 hours. You must call to schedule an appointment and specify it's for a drug challenge.   A few days prior to the appointment, I will send in a prescription for amoxicillin liquid which you must bring to the appointment as well.  Follow up in 2 months or sooner if needed.  Pet Allergen Avoidance: . Contrary to popular  opinion, there are no "hypoallergenic" breeds of dogs or cats. That is because people are not allergic to an animal's hair, but to an allergen found in the animal's saliva, dander (dead skin flakes) or urine. Pet allergy symptoms typically occur within minutes. For some people, symptoms can build up and become most severe 8 to 12 hours after contact with the animal. People with severe allergies can experience reactions in public places if dander has been transported on the pet owners' clothing. Keeping an animal outdoors is only a partial solution, since homes with pets in the yard still have higher concentrations of animal allergens. . Before getting a pet, ask your allergist to determine if you are allergic to animals. If your pet is already considered part of your family, try to minimize contact and keep the pet out of the bedroom and other rooms where you spend a great deal of time. . As with dust mites, vacuum carpets often or replace carpet with a hardwood floor, tile or linoleum. . High-efficiency particulate air (HEPA) cleaners can reduce allergen levels over time. . While dander and saliva are the source of cat and dog allergens, urine is the source of allergens from rabbits, hamsters, mice and 4m pigs; so ask a non-allergic family member to clean the animal's cage. . If you have a pet allergy, talk to your allergist about the potential for allergy immunotherapy (allergy shots). This strategy can often provide long-term relief.

## 2020-08-15 ENCOUNTER — Encounter: Payer: Self-pay | Admitting: Cardiovascular Disease

## 2020-08-15 ENCOUNTER — Ambulatory Visit: Payer: 59 | Admitting: Cardiovascular Disease

## 2020-08-15 ENCOUNTER — Other Ambulatory Visit: Payer: Self-pay

## 2020-08-15 VITALS — BP 96/72 | HR 89 | Ht 68.0 in | Wt 157.0 lb

## 2020-08-15 DIAGNOSIS — I25118 Atherosclerotic heart disease of native coronary artery with other forms of angina pectoris: Secondary | ICD-10-CM

## 2020-08-15 DIAGNOSIS — F17200 Nicotine dependence, unspecified, uncomplicated: Secondary | ICD-10-CM | POA: Diagnosis not present

## 2020-08-15 DIAGNOSIS — I25111 Atherosclerotic heart disease of native coronary artery with angina pectoris with documented spasm: Secondary | ICD-10-CM | POA: Diagnosis not present

## 2020-08-15 NOTE — Progress Notes (Signed)
Cardiology Office Note   Date:  08/15/2020   ID:  Makayla Green, DOB 1971-05-04, MRN [id number]  PCP:  Eustaquio Boyden, MD  Cardiologist:  Davone Shinault Electrophysiologist:  None   Evaluation Performed:  Follow-Up Visit  Chief Complaint: Chest pain  History of Present Illness:    Makayla Green is a 50 y.o. female with history of premature onset CAD (prominent coronary calcification on CT but only minor stenoses on coronary angiography April 2018), ongoing tobacco abuse, nitrate responsive chest pain possibly due to coronary spasm.  She has not been troubled by coronary spasm this year.  She has had a good response to a very low-dose of long-acting nitrates.  She has not taken sublingual nitroglycerin in a long time.  Her previous angina pattern was pain in her left shoulder radiating towards the scapular area.    She continues to smoke between half a pack and a pack of cigarettes a day.  She has tried multiple times to quit, including using Chantix, Wellbutrin, nicotine supplements, each time unsuccessfully due to severe mood swings.  She is allergic to the nicotine patches and is thinking of using the nicotine inhaler.  She tried vaping and developed bronchitis that was very hard to cure.  From COVID last December 2020.  She did have a protracted period of tachycardia following that infection, now finally back to normal.  The patient specifically denies any chest pain at rest exertion, dyspnea at rest or with exertion, orthopnea, paroxysmal nocturnal dyspnea, syncope, palpitations, focal neurological deficits, intermittent claudication, lower extremity edema, unexplained weight gain, cough, hemoptysis or wheezing.    Past Medical History:  Diagnosis Date   Abnormal pap    LEEP 2005   Asthma    Coronary artery disease    History of Bell's palsy 1990s   per pt   Recurrent upper respiratory infection (URI)    Solitary pulmonary nodule 07/06/2014    4 mm pulmonary  nodule seen on November 2015 CT chest    Tobacco use disorder    Past Surgical History:  Procedure Laterality Date   CARDIAC CATHETERIZATION N/A 11/14/2015   Procedure: Left Heart Cath and Coronary Angiography;  Surgeon: Iran Ouch, MD;  Location: [facility];  Service: Cardiovascular;  Laterality: N/A;     Current Meds  Medication Sig   albuterol (PROVENTIL HFA;VENTOLIN HFA) 108 (90 Base) MCG/ACT inhaler Inhale into the lungs. Every 6 hours as needed   aspirin EC 81 MG tablet Take 1 tablet (81 mg total) by mouth daily.   fexofenadine (ALLEGRA ALLERGY) 180 MG tablet Take 1 tablet (180 mg total) by mouth daily as needed for allergies or rhinitis.   fluticasone (FLONASE) 50 MCG/ACT nasal spray SPRAY 2 SPRAYS INTO EACH NOSTRIL EVERY DAY   isosorbide mononitrate (IMDUR) 30 MG 24 hr tablet TAKE 1/2 TABLET BY MOUTH DAILY   montelukast (SINGULAIR) 10 MG tablet Take 1 tablet (10 mg total) by mouth at bedtime.   rosuvastatin (CRESTOR) 5 MG tablet TAKE 1 TABLET (5 MG TOTAL) BY MOUTH DAILY AT 6 PM.   SYMBICORT 160-4.5 MCG/ACT inhaler INHALE 2 PUFFS INTO THE LUNG EVERY DAY   Tiotropium Bromide Monohydrate (SPIRIVA RESPIMAT) 2.5 MCG/ACT AERS Inhale 2 puffs into the lungs daily.   [DISCONTINUED] moxifloxacin (AVELOX) 400 MG tablet Take 1 tablet (400 mg total) by mouth daily at 8 pm.   [DISCONTINUED] predniSONE (DELTASONE) 20 MG tablet Take 3 pills once daily by mouth for 3 days, then 2 pills once  daily for 3 days, then 1 pill once daily for 3 days and then stop     Allergies:   Penicillins, Amitriptyline, Doxycycline, Methylprednisolone acetate, and Shellfish allergy   Social History   Tobacco Use   Smoking status: Current Every Day Smoker    Packs/day: 1.00    Years: 25.00    Pack years: 25.00    Types: Cigarettes   Smokeless tobacco: Never Used  Vaping Use   Vaping Use: Never used  Substance Use Topics   Alcohol use: No    Alcohol/week: 0.0 standard drinks    Drug use: No     Family Hx: The patient's family history includes COPD in her father and mother; Cancer in her mother; Heart disease in her mother; Lung cancer in her father; Neuropathy in her father. There is no history of Thyroid disease.  ROS:   Please see the history of present illness.    All other systems reviewed and are negative.  Prior CV studies:   The following studies were reviewed today:  2017 coronary angiography  Labs/Other Tests and Data Reviewed:    EKG:   ECG is ordered today and shows sinus rhythm with incomplete right bundle branch block, no ischemic repolarization abnormalities. Recent Labs: 10/26/2019: TSH 0.72   Recent Lipid Panel Lab Results  Component Value Date/Time   CHOL 167 04/01/2019 03:01 PM   TRIG 149 04/01/2019 03:01 PM   HDL 59 04/01/2019 03:01 PM   CHOLHDL 2.8 04/01/2019 03:01 PM   LDLCALC 83 04/01/2019 03:01 PM    Wt Readings from Last 3 Encounters:  08/15/20 157 lb (71.2 kg)  06/01/20 153 lb 6 oz (69.6 kg)  05/29/20 153 lb 12.8 oz (69.8 kg)     Objective:    Vital Signs:  BP 96/72 (BP Location: Left Arm, Patient Position: Sitting, Cuff Size: Normal)    Pulse 89    Ht 5\' 8"  (1.727 m)    Wt 157 lb (71.2 kg)    BMI 23.87 kg/m     General: Alert, oriented x3, no distress, lean Head: no evidence of trauma, PERRL, EOMI, no exophtalmos or lid lag, no myxedema, no xanthelasma; normal ears, nose and oropharynx Neck: normal jugular venous pulsations and no hepatojugular reflux; brisk carotid pulses without delay and no carotid bruits Chest: clear to auscultation, no signs of consolidation by percussion or palpation, normal fremitus, symmetrical and full respiratory excursions Cardiovascular: normal position and quality of the apical impulse, regular rhythm, normal first and second heart sounds, no murmurs, rubs or gallops Abdomen: no tenderness or distention, no masses by palpation, no abnormal pulsatility or arterial bruits, normal bowel  sounds, no hepatosplenomegaly Extremities: no clubbing, cyanosis or edema; 2+ radial, ulnar and brachial pulses bilaterally; 2+ right femoral, posterior tibial and dorsalis pedis pulses; 2+ left femoral, posterior tibial and dorsalis pedis pulses; no subclavian or femoral bruits Neurological: grossly nonfocal Psych: Normal mood and affect   ASSESSMENT & PLAN:    1. Angina pectoris: Very well controlled on low-dose isosorbide mononitrate. 2. Tachycardia: Resolved 3. HLP: Ideally would have LDL less than 70.  Due for recheck. 4. Tobacco abuse: Still important to continue trying to reduce smoking to minimum possible and eventually stop.  This is likely to be the driver for coronary vasospasm.   Patient Instructions  Medication Instructions:  No changes *If you need a refill on your cardiac medications before your next appointment, please call your pharmacy*   Lab Work: None ordered If you have labs (  blood work) drawn today and your tests are completely normal, you will receive your results only by:  MyChart Message (if you have MyChart) OR  A paper copy in the mail If you have any lab test that is abnormal or we need to change your treatment, we will call you to review the results.   Testing/Procedures: None ordered   Follow-Up: At [facility], you and your health needs are our priority.  As part of our continuing mission to provide you with exceptional heart care, we have created designated Provider Care Teams.  These Care Teams include your primary Cardiologist (physician) and Advanced Practice Providers (APPs -  Physician Assistants and Nurse Practitioners) who all work together to provide you with the care you need, when you need it.  We recommend signing up for the patient portal called "MyChart".  Sign up information is provided on this After Visit Summary.  MyChart is used to connect with patients for Virtual Visits (Telemedicine).  Patients are able to view lab/test  results, encounter notes, upcoming appointments, etc.  Non-urgent messages can be sent to your provider as well.   To learn more about what you can do with MyChart, go to [email or web address].    Your next appointment:   12 month(s)  The format for your next appointment:   In Person  Provider:   You may see Thurmon Fair, MD or one of the following Advanced Practice Providers on your designated Care Team:    Azalee Course, PA-C  Micah Flesher, [place] or   Judy Pimple, PA-C      Signed, Thurmon Fair, MD  08/15/2020 4:29 PM    [facility]

## 2020-08-15 NOTE — Patient Instructions (Signed)

## 2020-08-17 ENCOUNTER — Encounter: Payer: Self-pay | Admitting: Cardiovascular Disease

## 2020-08-23 ENCOUNTER — Telehealth: Payer: Self-pay

## 2020-08-23 ENCOUNTER — Other Ambulatory Visit: Payer: Self-pay

## 2020-08-23 MED ORDER — SPIRIVA RESPIMAT 2.5 MCG/ACT IN AERS: 2.0000 | INHALATION_SPRAY | Freq: Every day | RESPIRATORY_TRACT | 1 refills | Status: DC

## 2020-08-23 NOTE — Telephone Encounter (Signed)
Sent in refill for Spiriva Respimat

## 2020-08-28 MED ORDER — BUDESONIDE 0.5 MG/2ML IN SUSP: 0.5000 mg | Freq: Two times a day (BID) | RESPIRATORY_TRACT | 1 refills | Status: DC | PRN

## 2020-09-13 ENCOUNTER — Other Ambulatory Visit: Payer: Self-pay | Admitting: Cardiovascular Disease

## 2020-09-23 ENCOUNTER — Other Ambulatory Visit: Payer: Self-pay | Admitting: Family Medicine

## 2020-09-24 NOTE — Telephone Encounter (Signed)
E-scribed refill.  Plz schedule lab and cpe visits.  

## 2020-10-04 ENCOUNTER — Ambulatory Visit: Payer: 59 | Admitting: Allergy

## 2020-10-11 ENCOUNTER — Other Ambulatory Visit: Payer: Self-pay | Admitting: Cardiovascular Disease

## 2020-10-17 ENCOUNTER — Encounter: Payer: Self-pay | Admitting: Allergy

## 2020-10-18 ENCOUNTER — Telehealth: Payer: 59 | Admitting: Orthopedic Surgery

## 2020-10-18 DIAGNOSIS — R059 Cough, unspecified: Secondary | ICD-10-CM | POA: Diagnosis not present

## 2020-10-18 MED ORDER — AZITHROMYCIN 250 MG PO TABS: ORAL_TABLET | ORAL | 0 refills | Status: DC

## 2020-10-18 MED ORDER — PREDNISONE 20 MG PO TABS: 60.0000 mg | ORAL_TABLET | Freq: Every day | ORAL | 0 refills | Status: AC

## 2020-10-18 MED ORDER — BENZONATATE 100 MG PO CAPS: 100.0000 mg | ORAL_CAPSULE | Freq: Three times a day (TID) | ORAL | 0 refills | Status: DC | PRN

## 2020-10-18 NOTE — Progress Notes (Signed)
We are sorry that you are not feeling well.  Here is how we plan to help!  Based on your presentation I believe you most likely have A cough due to bacteria.  When patients have a fever and a productive cough with a change in color or increased sputum production, we are concerned about bacterial bronchitis.  If left untreated it can progress to pneumonia.  If your symptoms do not improve with your treatment plan it is important that you contact your provider.   I have prescribed Azithromyin 250 mg: two tablets now and then one tablet daily for 4 additonal days    In addition you may use A prescription cough medication called Tessalon Perles 100mg . You may take 1-2 capsules every 8 hours as needed for your cough.  And Prednisone 60mg  daily for 5 days.  From your responses in the eVisit questionnaire you describe inflammation in the upper respiratory tract which is causing a significant cough.  This is commonly called Bronchitis and has four common causes:    Allergies  Viral Infections  Acid Reflux  Bacterial Infection Allergies, viruses and acid reflux are treated by controlling symptoms or eliminating the cause. An example might be a cough caused by taking certain blood pressure medications. You stop the cough by changing the medication. Another example might be a cough caused by acid reflux. Controlling the reflux helps control the cough.  USE OF BRONCHODILATOR ("RESCUE") INHALERS: There is a risk from using your bronchodilator too frequently.  The risk is that over-reliance on a medication which only relaxes the muscles surrounding the breathing tubes can reduce the effectiveness of medications prescribed to reduce swelling and congestion of the tubes themselves.  Although you feel brief relief from the bronchodilator inhaler, your asthma may actually be worsening with the tubes becoming more swollen and filled with mucus.  This can delay other crucial treatments, such as oral steroid  medications. If you need to use a bronchodilator inhaler daily, several times per day, you should discuss this with your provider.  There are probably better treatments that could be used to keep your asthma under control.     HOME CARE . Only take medications as instructed by your medical team. . Complete the entire course of an antibiotic. . Drink plenty of fluids and get plenty of rest. . Avoid close contacts especially the very young and the elderly . Cover your mouth if you cough or cough into your sleeve. . Always remember to wash your hands . A steam or ultrasonic humidifier can help congestion.   GET HELP RIGHT AWAY IF: . You develop worsening fever. . You become short of breath . You cough up blood. . Your symptoms persist after you have completed your treatment plan MAKE SURE YOU   Understand these instructions.  Will watch your condition.  Will get help right away if you are not doing well or get worse.  Your e-visit answers were reviewed by a board certified advanced clinical practitioner to complete your personal care plan.  Depending on the condition, your plan could have included both over the counter or prescription medications. If there is a problem please reply  once you have received a response from your provider. Your safety is important to .  If you have drug allergies check your prescription carefully.    You can use MyChart to ask questions about today's visit, request a non-urgent call back, or ask for a work or school excuse for 24 hours related  to this e-Visit. If it has been greater than 24 hours you will need to follow up with your provider, or enter a new e-Visit to address those concerns. You will get an e-mail in the next two days asking about your experience.  I hope that your e-visit has been valuable and will speed your recovery. Thank you for using e-visits.  Greater than 5 minutes, yet less than 10 minutes of time have been spent researching,  coordinating and implementing care for this patient today.

## 2020-10-22 ENCOUNTER — Other Ambulatory Visit: Payer: Self-pay | Admitting: Family Medicine

## 2020-10-23 NOTE — Telephone Encounter (Signed)
Refill request Symbicort Last office visit 06/01/20 Last refill 09/24/20  10.2 each Upcoming appointment 12/04/20 See allergy/contraindication

## 2020-10-24 ENCOUNTER — Other Ambulatory Visit: Payer: Self-pay | Admitting: Family Medicine

## 2020-10-24 ENCOUNTER — Other Ambulatory Visit: Payer: Self-pay | Admitting: Allergy

## 2020-10-24 ENCOUNTER — Encounter: Payer: Self-pay | Admitting: Family Medicine

## 2020-10-24 NOTE — Telephone Encounter (Signed)
Pharmacy requests refill on: Budesonide-formoterol 160-4.5 mcg/act inhaler   LAST REFILL: 02/211/2022 (Q-10.2, R-0) LAST OV: 06/01/2020 NEXT OV: 12/04/2020 PHARMACY: [id number] [place], [place]

## 2020-10-25 NOTE — Telephone Encounter (Signed)
Ok to fill 

## 2020-11-01 ENCOUNTER — Ambulatory Visit: Payer: 59 | Admitting: Allergy

## 2020-11-01 DIAGNOSIS — J309 Allergic rhinitis, unspecified: Secondary | ICD-10-CM

## 2020-11-01 NOTE — Progress Notes (Deleted)
Follow Up Note  RE: Makayla Green MRN: [id number] DOB: July 21, 1971 Date of Office Visit: 11/01/2020  Referring provider: Eustaquio Boyden, MD Primary care provider: Eustaquio Boyden, MD  Chief Complaint: No chief complaint on file.  History of Present Illness: I had the pleasure of seeing Makayla Green for a follow up visit at the [facility] on 11/01/2020. She is a 50 y.o. female, who is being followed for asthma, rhinitis, adverse drug reaction. Her previous allergy office visit was on 07/31/2020 with Dr. Selena Batten. Today is a regular follow up visit.    During upper respiratory infections/asthma flares: start pulmicort nebulizer 0.5mg  twice a day for 1-2 weeks until your breathing symptoms return to baseline.     Severe persistent asthma without complication Past history - asthma for 40+ years and required hospitalizations as a child. Noted worsening symptoms since COVID-19 infection. Currently on Symbicort 2 puffs twice a day for 4-5 years and using albuterol 3-5 times per week. Patient is a current smoker 1 ppd x 25 years. 2021 spirometry showed: mixed obstructive and restrictive disease with 7% improvement in FEV1 post bronchodilator treatment. Clinically feeling better. Interim history - Spiriva is helping. Decreased smoking to 1/2pack per day. 2 weeks of worsening coughing/sinus issues. Not using albuterol at all.   Daily controller medication(s):continue with Symbicort 2 puffs twice a day with spacer and rinse mouth afterwards.  Continue Spiriva 2.23mcg 2 puffs daily.  May use albuterol rescue inhaler 2 puffs every 4 to 6 hours as needed for shortness of breath, chest tightness, coughing, and wheezing. May use albuterol rescue inhaler 2 puffs 5 to 15 minutes prior to strenuous physical activities. Monitor frequency of use.   Recommendsmoking cessation.  Get spirometry at next visit.   Acute sinusitis A few weeks history of increased  nasal congestion with mucous.  Start prednisone 20mg  tablets: ? Take 3 pills once daily by mouth for 3 days, then 2 pills once daily for 3 days, then 1 pill once daily for 3 days and then stop ? If not felling better, start Avelox 400mg  once a day for 10 days. Side effects discussed.  Patient can't take penicillins, doxycycline and not sure if ever had cephalosporins.   Other allergic rhinitis Past history - Rhinitis symptoms in the spring and fall and takes Allegra-D and Singulair.   May use over the counter antihistamines such as Zyrtec (cetirizine), Claritin (loratadine), Allegra (fexofenadine), or Xyzal (levocetirizine) daily as needed.  Continue Singulair 10mg  daily.   May use Flonase (fluticasone) nasal spray 1 spray per nostril twice a day as needed for nasal congestion.   Nasal saline spray (i.e., Simply Saline) or nasal saline lavage (i.e., NeilMed) is recommended as needed and prior to medicated nasal sprays.  Recommend testing in the future.  Drug reaction Past history - Reactions to penicillin in the past and possibly to doxycycline. No issues with past vaccinations.Concerned about getting the COVID-19 vaccine. Had COVID-19 infection. 2021 skin prick and intradermal testing was negative toMiralax (source of PEG 3350), and triamcinolone acetonide(source of polysorbate-80). Positive to methylprednisolone acetate(source of PEG 3350). Interim history - patient still hesitant about getting the COVID-19 vaccine.  Continue to methylprednisolone acetate. ? Okay with oral prednisone.   Continue to avoid penicillin.   Consider penicillin allergy skin testing and in office drug challenge in the future.   Return in about 2 months (around 10/01/2020).  Assessment and Plan: Makayla Green is a 50 y.o. female with: No problem-specific Assessment &  Plan notes found for this encounter.  No follow-ups on file.  No orders of the defined types were placed in this encounter.  Lab  Orders  No laboratory test(s) ordered today    Diagnostics: Spirometry:  Tracings reviewed. Her effort: {Blank single:19197::"Good reproducible efforts.","It was hard to get consistent efforts and there is a question as to whether this reflects a maximal maneuver.","Poor effort, data can not be interpreted."} FVC: ***L FEV1: ***L, ***% predicted FEV1/FVC ratio: ***% Interpretation: {Blank single:19197::"Spirometry consistent with mild obstructive disease","Spirometry consistent with moderate obstructive disease","Spirometry consistent with severe obstructive disease","Spirometry consistent with possible restrictive disease","Spirometry consistent with mixed obstructive and restrictive disease","Spirometry uninterpretable due to technique","Spirometry consistent with normal pattern","No overt abnormalities noted given today's efforts"}.  Please see scanned spirometry results for details.  Skin Testing: {Blank single:19197::"Select foods","Environmental allergy panel","Environmental allergy panel and select foods","Food allergy panel","None","Deferred due to recent antihistamines use"}. Positive test to: ***. Negative test to: ***.  Results discussed with patient/family.   Medication List:  Current Outpatient Medications  Medication Sig Dispense Refill  . albuterol (PROVENTIL HFA;VENTOLIN HFA) 108 (90 Base) MCG/ACT inhaler Inhale into the lungs. Every 6 hours as needed    . aspirin EC 81 MG tablet Take 1 tablet (81 mg total) by mouth daily. 90 tablet 3  . azithromycin (ZITHROMAX Z-PAK) 250 MG tablet 500mg  PO on day 1 then 250mg  PO daily until gone 6 each 0  . benzonatate (TESSALON) 100 MG capsule Take 1 capsule (100 mg total) by mouth 3 (three) times daily as needed for cough. 20 capsule 0  . budesonide (PULMICORT) 0.5 MG/2ML nebulizer solution Take 2 mLs (0.5 mg total) by nebulization 2 (two) times daily as needed (during asthma flares/URIs for 1-2 weeks.). 60 mL 1  . fexofenadine (ALLEGRA  ALLERGY) 180 MG tablet Take 1 tablet (180 mg total) by mouth daily as needed for allergies or rhinitis.    . fluticasone (FLONASE) 50 MCG/ACT nasal spray SPRAY 2 SPRAYS INTO EACH NOSTRIL EVERY DAY 16 mL 2  . isosorbide mononitrate (IMDUR) 30 MG 24 hr tablet TAKE 1/2 TABLET BY MOUTH EVERY DAY 15 tablet 4  . montelukast (SINGULAIR) 10 MG tablet Take 1 tablet (10 mg total) by mouth at bedtime. 30 tablet 11  . rosuvastatin (CRESTOR) 5 MG tablet TAKE 1 TABLET (5 MG TOTAL) BY MOUTH DAILY AT 6 PM. 30 tablet 5  . SYMBICORT 160-4.5 MCG/ACT inhaler INHALE 2 PUFFS BY MOUTH EVERY DAY 10.2 each 3  . Tiotropium Bromide Monohydrate (SPIRIVA RESPIMAT) 2.5 MCG/ACT AERS Inhale 2 puffs into the lungs daily. 4 g 1   No current facility-administered medications for this visit.   Allergies: Allergies  Allergen Reactions  . Penicillins Shortness Of Breath and Swelling    Has patient had a PCN reaction causing immediate rash, facial/tongue/throat swelling, SOB or lightheadedness with hypotension: Yes Has patient had a PCN reaction causing severe rash involving mucus membranes or skin necrosis: No Has patient had a PCN reaction that required hospitalization No Has patient had a PCN reaction occurring within the last 10 years: No If all of the above answers are "NO", then may proceed with Cephalosporin use.   . Amitriptyline     Hallucinations  . Doxycycline   . Methylprednisolone Acetate Other (See Comments)    Positive reaction to skin prick/intradermal testing by allergist  . Shellfish Allergy     Patient has not eaten any shellfish and does not know if she is allergic. She has had radiology images using dye with  no reactions noted.    I reviewed her past medical history, social history, family history, and environmental history and no significant changes have been reported from her previous visit.  Review of Systems  Constitutional: Negative for appetite change, chills, fever and unexpected weight change.   HENT: Positive for congestion and rhinorrhea.   Eyes: Negative for itching.  Respiratory: Positive for cough, chest tightness and shortness of breath. Negative for wheezing.   Cardiovascular: Negative for chest pain.  Gastrointestinal: Negative for abdominal pain.  Genitourinary: Negative for difficulty urinating.  Skin: Negative for rash.  Neurological: Negative for headaches.   Objective: There were no vitals taken for this visit. There is no height or weight on file to calculate BMI. Physical Exam Vitals and nursing note reviewed.  Constitutional:      Appearance: Normal appearance. She is well-developed.  HENT:     Head: Normocephalic and atraumatic.     Right Ear: Tympanic membrane and external ear normal.     Left Ear: Tympanic membrane and external ear normal.     Nose: Congestion and rhinorrhea present.     Comments: On right side    Mouth/Throat:     Mouth: Mucous membranes are moist.     Pharynx: Oropharynx is clear.     Comments: Upper dentures present. Eyes:     Conjunctiva/sclera: Conjunctivae normal.  Cardiovascular:     Rate and Rhythm: Normal rate and regular rhythm.     Heart sounds: Normal heart sounds. No murmur heard. No friction rub. No gallop.   Pulmonary:     Effort: Pulmonary effort is normal.     Breath sounds: Normal breath sounds. No wheezing, rhonchi or rales.  Musculoskeletal:     Cervical back: Neck supple.  Skin:    General: Skin is warm.     Findings: No rash.  Neurological:     Mental Status: She is alert and oriented to person, place, and time.  Psychiatric:        Behavior: Behavior normal.    Previous notes and tests were reviewed. The plan was reviewed with the patient/family, and all questions/concerned were addressed.  It was my pleasure to see Makayla Green today and participate in her care. Please feel free to contact me with any questions or concerns.  Sincerely,  Wyline Mood, DO Allergy & Immunology  [facility] office: [phone] [facility] office: [phone]

## 2020-11-29 ENCOUNTER — Other Ambulatory Visit: Payer: Self-pay

## 2020-11-29 ENCOUNTER — Other Ambulatory Visit (INDEPENDENT_AMBULATORY_CARE_PROVIDER_SITE_OTHER): Payer: 59

## 2020-11-29 ENCOUNTER — Other Ambulatory Visit: Payer: Self-pay | Admitting: Family Medicine

## 2020-11-29 DIAGNOSIS — R7303 Prediabetes: Secondary | ICD-10-CM | POA: Diagnosis not present

## 2020-11-29 DIAGNOSIS — Z1159 Encounter for screening for other viral diseases: Secondary | ICD-10-CM

## 2020-11-29 DIAGNOSIS — E0590 Thyrotoxicosis, unspecified without thyrotoxic crisis or storm: Secondary | ICD-10-CM | POA: Diagnosis not present

## 2020-11-29 LAB — CBC WITH DIFFERENTIAL/PLATELET
Basophils Absolute: 0.1 10*3/uL (ref 0.0–0.1)
Basophils Relative: 0.9 % (ref 0.0–3.0)
Eosinophils Absolute: 0.2 10*3/uL (ref 0.0–0.7)
Eosinophils Relative: 1.7 % (ref 0.0–5.0)
HCT: 44.9 % (ref 36.0–46.0)
Hemoglobin: 15.1 g/dL — ABNORMAL HIGH (ref 12.0–15.0)
Lymphocytes Relative: 27.8 % (ref 12.0–46.0)
Lymphs Abs: 3.5 10*3/uL (ref 0.7–4.0)
MCHC: 33.6 g/dL (ref 30.0–36.0)
MCV: 96.3 fl (ref 78.0–100.0)
Monocytes Absolute: 1 10*3/uL (ref 0.1–1.0)
Monocytes Relative: 7.7 % (ref 3.0–12.0)
Neutro Abs: 7.7 10*3/uL (ref 1.4–7.7)
Neutrophils Relative %: 61.9 % (ref 43.0–77.0)
Platelets: 334 10*3/uL (ref 150.0–400.0)
RBC: 4.66 Mil/uL (ref 3.87–5.11)
RDW: 13.6 % (ref 11.5–15.5)
WBC: 12.4 10*3/uL — ABNORMAL HIGH (ref 4.0–10.5)

## 2020-11-29 LAB — BASIC METABOLIC PANEL
BUN: 14 mg/dL (ref 6–23)
CO2: 28 mEq/L (ref 19–32)
Calcium: 10.4 mg/dL (ref 8.4–10.5)
Chloride: 105 mEq/L (ref 96–112)
Creatinine, Ser: 0.69 mg/dL (ref 0.40–1.20)
GFR: 102.56 mL/min (ref >60.00)
Glucose, Bld: 105 mg/dL — ABNORMAL HIGH (ref 70–99)
Potassium: 4.9 mEq/L (ref 3.5–5.1)
Sodium: 141 mEq/L (ref 135–145)

## 2020-11-29 LAB — TSH: TSH: 0.68 u[IU]/mL (ref 0.35–4.50)

## 2020-11-29 LAB — HEMOGLOBIN A1C: Hgb A1c MFr Bld: 6.5 % (ref 4.6–6.5)

## 2020-11-30 LAB — HEPATITIS C ANTIBODY
Hepatitis C Ab: NONREACTIVE
SIGNAL TO CUT-OFF: 0 (ref <1.00)

## 2020-12-04 ENCOUNTER — Encounter: Payer: Self-pay | Admitting: Family Medicine

## 2020-12-04 ENCOUNTER — Ambulatory Visit (INDEPENDENT_AMBULATORY_CARE_PROVIDER_SITE_OTHER): Payer: 59 | Admitting: Family Medicine

## 2020-12-04 ENCOUNTER — Other Ambulatory Visit: Payer: Self-pay

## 2020-12-04 VITALS — BP 118/78 | HR 109 | Temp 98.1°F | Ht 67.0 in | Wt 159.4 lb

## 2020-12-04 DIAGNOSIS — E118 Type 2 diabetes mellitus with unspecified complications: Secondary | ICD-10-CM

## 2020-12-04 DIAGNOSIS — J209 Acute bronchitis, unspecified: Secondary | ICD-10-CM

## 2020-12-04 DIAGNOSIS — J432 Centrilobular emphysema: Secondary | ICD-10-CM

## 2020-12-04 DIAGNOSIS — F17200 Nicotine dependence, unspecified, uncomplicated: Secondary | ICD-10-CM

## 2020-12-04 DIAGNOSIS — E0590 Thyrotoxicosis, unspecified without thyrotoxic crisis or storm: Secondary | ICD-10-CM | POA: Diagnosis not present

## 2020-12-04 DIAGNOSIS — Z0001 Encounter for general adult medical examination with abnormal findings: Secondary | ICD-10-CM

## 2020-12-04 DIAGNOSIS — Z1211 Encounter for screening for malignant neoplasm of colon: Secondary | ICD-10-CM

## 2020-12-04 DIAGNOSIS — M25522 Pain in left elbow: Secondary | ICD-10-CM

## 2020-12-04 DIAGNOSIS — I25111 Atherosclerotic heart disease of native coronary artery with angina pectoris with documented spasm: Secondary | ICD-10-CM

## 2020-12-04 DIAGNOSIS — J4550 Severe persistent asthma, uncomplicated: Secondary | ICD-10-CM

## 2020-12-04 MED ORDER — DOXYCYCLINE HYCLATE 100 MG PO TABS: 100.0000 mg | ORAL_TABLET | Freq: Two times a day (BID) | ORAL | 0 refills | Status: DC

## 2020-12-04 MED ORDER — PREDNISONE 20 MG PO TABS: ORAL_TABLET | ORAL | 0 refills | Status: DC

## 2020-12-04 MED ORDER — SPIRIVA RESPIMAT 2.5 MCG/ACT IN AERS: 2.0000 | INHALATION_SPRAY | Freq: Every day | RESPIRATORY_TRACT | 11 refills | Status: DC

## 2020-12-04 MED ORDER — BUDESONIDE-FORMOTEROL FUMARATE 160-4.5 MCG/ACT IN AERO: 2.0000 | INHALATION_SPRAY | Freq: Two times a day (BID) | RESPIRATORY_TRACT | 11 refills | Status: DC

## 2020-12-04 NOTE — Progress Notes (Signed)
Patient ID: Makayla Green, female    DOB: [birth year], 50 y.o.   MRN: [id number]  This visit was conducted in person.  BP 118/78   Pulse (!) 109   Temp 98.1 F (36.7 C) (Temporal)   Ht 5' 7"  (1.702 m)   Wt 159 lb 6 oz (72.3 kg)   SpO2 97%   BMI 24.96 kg/m    CC: CPE  Subjective:   HPI: Makayla Green is a 50 y.o. female presenting on 12/04/2020 for Annual Exam and Cough (Dx in 05/2020 with bronchitis and treated with abx and prednisone.  Pt still c/o sxs and requests another abx and more prednisone. )   E visit 10/2020 - dx bronchitis treated with zpack and prednisone and tessalon perls. She was previously treated by allergist with avelox and prednisone taper with benefit. Ongoing cough somewhat productive of colored mucous.   Asthma/COPD - on spiriva and symbicort as well as singulair, flonase, allegra, albuterol inhaler PRN and pulmicort neg PRN flares. Worse since COVID infection 07/2019.   Worsening L elbow pain without inciting trauma/injury or falls. 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 Sleeve on elbow helps.   Smoking - 1/2 ppd.   Preventative: Colon cancer screening - discussed. Would like iFOB.  Breast cancer screening - no mammo in the past - will refer and provide # Well woman exam - last pap normal 2015.  LMP years ago DEXA scan - not due Lung cancer screening - not due Flu shot yearly COVID vaccine - declined Tetanus shot - due Due for pneumonia shot  Shingrix - not due Seat belt use discussed  Sunscreen use discussed. No changing moles on skin.  Smoking - 1/2ppd, started age 22yo, about 30 PY hx Alcohol - none Dentist - q6 mo Eye exam - monitoring borderline glaucoma with [facility] eye doctor   Married. Lives with husband, 2 sons and FIL 3 children, 1 grandchild. Occ: works in Personal assistant.      Relevant past medical, surgical, family and social history reviewed and updated as indicated. Interim medical history since our last visit  reviewed. Allergies and medications reviewed and updated. Outpatient Medications Prior to Visit  Medication Sig Dispense Refill  . albuterol (PROVENTIL HFA;VENTOLIN HFA) 108 (90 Base) MCG/ACT inhaler Inhale into the lungs. Every 6 hours as needed    . aspirin EC 81 MG tablet Take 1 tablet (81 mg total) by mouth daily. 90 tablet 3  . budesonide (PULMICORT) 0.5 MG/2ML nebulizer solution Take 2 mLs (0.5 mg total) by nebulization 2 (two) times daily as needed (during asthma flares/URIs for 1-2 weeks.). 60 mL 1  . fexofenadine (ALLEGRA ALLERGY) 180 MG tablet Take 1 tablet (180 mg total) by mouth daily as needed for allergies or rhinitis.    . fluticasone (FLONASE) 50 MCG/ACT nasal spray SPRAY 2 SPRAYS INTO EACH NOSTRIL EVERY DAY 16 mL 2  . isosorbide mononitrate (IMDUR) 30 MG 24 hr tablet TAKE 1/2 TABLET BY MOUTH EVERY DAY 15 tablet 4  . montelukast (SINGULAIR) 10 MG tablet Take 1 tablet (10 mg total) by mouth at bedtime. 30 tablet 11  . rosuvastatin (CRESTOR) 5 MG tablet TAKE 1 TABLET (5 MG TOTAL) BY MOUTH DAILY AT 6 PM. 30 tablet 5  . SYMBICORT 160-4.5 MCG/ACT inhaler INHALE 2 PUFFS BY MOUTH EVERY DAY 10.2 each 3  . Tiotropium Bromide Monohydrate (SPIRIVA RESPIMAT) 2.5 MCG/ACT AERS Inhale 2 puffs into the lungs daily. 4 g 1  . azithromycin (ZITHROMAX Z-PAK) 250  MG tablet 577m PO on day 1 then 2567mPO daily until gone 6 each 0  . benzonatate (TESSALON) 100 MG capsule Take 1 capsule (100 mg total) by mouth 3 (three) times daily as needed for cough. 20 capsule 0   No facility-administered medications prior to visit.     Per HPI unless specifically indicated in ROS section below Review of Systems  Constitutional: Negative for activity change, appetite change, chills, fatigue, fever and unexpected weight change.  HENT: Negative for hearing loss.   Eyes: Negative for visual disturbance.  Respiratory: Positive for cough (proudctive), shortness of breath and wheezing. Negative for chest tightness.         Taking mucinex regularly  Cardiovascular: Negative for chest pain, palpitations and leg swelling.  Gastrointestinal: Negative for abdominal distention, abdominal pain, blood in stool, constipation, diarrhea, nausea and vomiting.  Genitourinary: Negative for difficulty urinating and hematuria.  Musculoskeletal: Negative for arthralgias, myalgias and neck pain.  Skin: Negative for rash.  Neurological: Negative for dizziness, seizures, syncope and headaches.  Hematological: Negative for adenopathy. Does not bruise/bleed easily.  Psychiatric/Behavioral: Negative for dysphoric mood. The patient is not nervous/anxious.    Objective:  BP 118/78   Pulse (!) 109   Temp 98.1 F (36.7 C) (Temporal)   Ht 5' 7"  (1.702 m)   Wt 159 lb 6 oz (72.3 kg)   SpO2 97%   BMI 24.96 kg/m   Wt Readings from Last 3 Encounters:  12/04/20 159 lb 6 oz (72.3 kg)  08/15/20 157 lb (71.2 kg)  06/01/20 153 lb 6 oz (69.6 kg)      Physical Exam Vitals and nursing note reviewed.  Constitutional:      General: She is not in acute distress.    Appearance: Normal appearance. She is well-developed. She is not ill-appearing.  HENT:     Head: Normocephalic and atraumatic.     Right Ear: Hearing, tympanic membrane, ear canal and external ear normal.     Left Ear: Hearing, tympanic membrane, ear canal and external ear normal.  Eyes:     General: No scleral icterus.    Extraocular Movements: Extraocular movements intact.     Conjunctiva/sclera: Conjunctivae normal.     Pupils: Pupils are equal, round, and reactive to light.  Neck:     Thyroid: No thyroid mass or thyromegaly.  Cardiovascular:     Rate and Rhythm: Normal rate and regular rhythm.     Pulses: Normal pulses.          Radial pulses are 2+ on the right side and 2+ on the left side.     Heart sounds: Normal heart sounds. No murmur heard.   Pulmonary:     Effort: Pulmonary effort is normal. No respiratory distress.     Breath sounds: Normal breath  sounds. No wheezing, rhonchi or rales.     Comments: Lungs largely clear Abdominal:     General: Bowel sounds are normal. There is no distension.     Palpations: Abdomen is soft. There is no mass.     Tenderness: There is no abdominal tenderness. There is no guarding or rebound.     Hernia: No hernia is present.  Musculoskeletal:        General: Tenderness present. No swelling. Normal range of motion.     Cervical back: Normal range of motion and neck supple.     Right lower leg: No edema.     Left lower leg: No edema.     Comments:  FROM bilateral elbows No pain at lateral or medial epicondyle  Discomfort to palpation posterior left elbow proximal to olecranon without swelling or erythema or warmth  Lymphadenopathy:     Cervical: No cervical adenopathy.  Skin:    General: Skin is warm and dry.     Findings: No rash.  Neurological:     General: No focal deficit present.     Mental Status: She is alert and oriented to person, place, and time.     Comments: CN grossly intact, station and gait intact  Psychiatric:        Mood and Affect: Mood normal.        Behavior: Behavior normal.        Thought Content: Thought content normal.        Judgment: Judgment normal.       Results for orders placed or performed in visit on 11/29/20  Hepatitis C antibody  Result Value Ref Range   Hepatitis C Ab NON-REACTIVE NON-REACTIVE   SIGNAL TO CUT-OFF 0.00 <0.34  Basic metabolic panel  Result Value Ref Range   Sodium 141 135 - 145 mEq/L   Potassium 4.9 3.5 - 5.1 mEq/L   Chloride 105 96 - 112 mEq/L   CO2 28 19 - 32 mEq/L   Glucose, Bld 105 (H) 70 - 99 mg/dL   BUN 14 6 - 23 mg/dL   Creatinine, Ser 0.69 0.40 - 1.20 mg/dL   GFR 102.56 >60.00 mL/min   Calcium 10.4 8.4 - 10.5 mg/dL  CBC with Differential/Platelet  Result Value Ref Range   WBC 12.4 (H) 4.0 - 10.5 K/uL   RBC 4.66 3.87 - 5.11 Mil/uL   Hemoglobin 15.1 (H) 12.0 - 15.0 g/dL   HCT 44.9 36.0 - 46.0 %   MCV 96.3 78.0 - 100.0 fl    MCHC 33.6 30.0 - 36.0 g/dL   RDW 13.6 11.5 - 15.5 %   Platelets 334.0 150.0 - 400.0 K/uL   Neutrophils Relative % 61.9 43.0 - 77.0 %   Lymphocytes Relative 27.8 12.0 - 46.0 %   Monocytes Relative 7.7 3.0 - 12.0 %   Eosinophils Relative 1.7 0.0 - 5.0 %   Basophils Relative 0.9 0.0 - 3.0 %   Neutro Abs 7.7 1.4 - 7.7 K/uL   Lymphs Abs 3.5 0.7 - 4.0 K/uL   Monocytes Absolute 1.0 0.1 - 1.0 K/uL   Eosinophils Absolute 0.2 0.0 - 0.7 K/uL   Basophils Absolute 0.1 0.0 - 0.1 K/uL  Hemoglobin A1c  Result Value Ref Range   Hgb A1c MFr Bld 6.5 4.6 - 6.5 %  TSH  Result Value Ref Range   TSH 0.68 0.35 - 4.50 uIU/mL   Assessment & Plan:  This visit occurred during the SARS-CoV-2 public health emergency.  Safety protocols were in place, including screening questions prior to the visit, additional usage of staff PPE, and extensive cleaning of exam room while observing appropriate contact time as indicated for disinfecting solutions.   Problem List Items Addressed This Visit    TOBACCO ABUSE    Continued 1/2 ppd smoker - and total 30+ PY hx.  Continue to encourage full cessation. Discussed nicotine replacement therapies.  Will be eligible for lung cancer screening at age 37yo       Hyperthyroidism    Chronic, TSH adequate off medication.  H/o hyperthyroidism previously saw endo.       Centrilobular emphysema (HCC)    Previously noted on imaging.  Reviewed current regimen - will  increase symbicort to 125m 2 puffs BID (was using once daily).  Eligible for pneumonia shot - will defer in current illness See below re acute bronchitis.       Relevant Medications   budesonide-formoterol (SYMBICORT) 160-4.5 MCG/ACT inhaler   Tiotropium Bromide Monohydrate (SPIRIVA RESPIMAT) 2.5 MCG/ACT AERS   predniSONE (DELTASONE) 20 MG tablet   Coronary artery disease involving native coronary artery of native heart    Followed by cardiology, on statin, aspirin, imdur.       Acute bronchitis    Ongoing  productive cough in known COPD, asthma and continued smoker.  Will Rx short prednisone taper, doxycycline.  Has doxy listed as allergy but she does not remember if this is true allergy. Agrees to retrial - discussed photosensitivity precautions, GI side effects, update with effect. She will closely monitor for any reaction.       Severe persistent asthma without complication    Has seen allergist, on singulair, symbicort, spiriva, flonase, antihistamine, and albuterol PRN. Encouraged smoking cessation.      Relevant Medications   budesonide-formoterol (SYMBICORT) 160-4.5 MCG/ACT inhaler   Tiotropium Bromide Monohydrate (SPIRIVA RESPIMAT) 2.5 MCG/ACT AERS   predniSONE (DELTASONE) 20 MG tablet   Encounter for general adult medical examination with abnormal findings - Primary    Preventative protocols reviewed and updated unless pt declined. Discussed healthy diet and lifestyle.  Sent home today with iFOB  Mammogram ordered, # provided to schedule  She declines well woman exam - will reschedule.       Left elbow pain    Exam not consistent with epicondylitis or olecranon bursitis, FROM at elbow. rec voltaren gel, continued sleeve use, monitor effect on prednisone.       Controlled diabetes mellitus type 2 with complications ([facility]    New diagnosis, previously in prediabetes range. Attributes to poor high sugar diet over the last several months.  Will renew efforts for healthy diet chances.  Recommend RTC 6 mo DM f/u visit.        Other Visit Diagnoses    Special screening for malignant neoplasms, colon       Relevant Orders   Fecal occult blood, imunochemical   MM 3D SCREEN BREAST BILATERAL       Meds ordered this encounter  Medications  . budesonide-formoterol (SYMBICORT) 160-4.5 MCG/ACT inhaler    Sig: Inhale 2 puffs into the lungs in the morning and at bedtime.    Dispense:  10.2 each    Refill:  11  . Tiotropium Bromide Monohydrate (SPIRIVA RESPIMAT) 2.5 MCG/ACT AERS     Sig: Inhale 2 puffs into the lungs daily.    Dispense:  4 g    Refill:  11  . predniSONE (DELTASONE) 20 MG tablet    Sig: Take two tablets daily for 3 days followed by one tablet daily for 4 days    Dispense:  10 tablet    Refill:  0  . doxycycline (VIBRA-TABS) 100 MG tablet    Sig: Take 1 tablet (100 mg total) by mouth 2 (two) times daily.    Dispense:  14 tablet    Refill:  0   Orders Placed This Encounter  Procedures  . Fecal occult blood, imunochemical    Standing Status:   Future    Standing Expiration Date:   12/04/2021  . MM 3D SCREEN BREAST BILATERAL    Standing Status:   Future    Standing Expiration Date:   12/04/2021    Order Specific Question:  Reason for Exam (SYMPTOM  OR DIAGNOSIS REQUIRED)    Answer:   breast cancer screening    Order Specific Question:   Preferred imaging location?    Answer:   [facility]    Order Specific Question:   Is the patient pregnant?    Answer:   No    Patient instructions: Pass by lab to pick up stool kit.  Call to schedule mammogram at your convenience: [facility] [phone] You are overdue for well woman exam - schedule at your convenience and return for this.  Increase symbicort to 2 puffs twice daily, continue spiriva nightly.  Take prednisone taper as well as antibiotic course sent to pharmacy.   Follow up plan: Return if symptoms worsen or fail to improve.  Ria Bush, MD

## 2020-12-04 NOTE — Patient Instructions (Addendum)
Pass by lab to pick up stool kit.  Call to schedule mammogram at your convenience: [facility] [phone] You are overdue for well woman exam - schedule at your convenience and return for this.  Increase symbicort to 2 puffs twice daily, continue spiriva nightly.  Take prednisone taper as well as antibiotic course sent to pharmacy.   Health Maintenance for Postmenopausal Women Menopause is a normal process in which your ability to get pregnant comes to an end. This process happens slowly over many months or years, usually between the ages of 20 and 31. Menopause is complete when you have missed your menstrual periods for 12 months. It is important to talk with your health care provider about some of the most common conditions that affect women after menopause (postmenopausal women). These include heart disease, cancer, and bone loss (osteoporosis). Adopting a healthy lifestyle and getting preventive care can help to promote your health and wellness. The actions you take can also lower your chances of developing some of these common conditions. What should I know about menopause? During menopause, you may get a number of symptoms, such as:  Hot flashes. These can be moderate or severe.  Night sweats.  Decrease in sex drive.  Mood swings.  Headaches.  Tiredness.  Irritability.  Memory problems.  Insomnia. Choosing to treat or not to treat these symptoms is a decision that you make with your health care provider. Do I need hormone replacement therapy?  Hormone replacement therapy is effective in treating symptoms that are caused by menopause, such as hot flashes and night sweats.  Hormone replacement carries certain risks, especially as you become older. If you are thinking about using estrogen or estrogen with progestin, discuss the benefits and risks with your health care provider. What is my risk for heart disease and stroke? The risk of heart disease, heart  attack, and stroke increases as you age. One of the causes may be a change in the body's hormones during menopause. This can affect how your body uses dietary fats, triglycerides, and cholesterol. Heart attack and stroke are medical emergencies. There are many things that you can do to help prevent heart disease and stroke. Watch your blood pressure  High blood pressure causes heart disease and increases the risk of stroke. This is more likely to develop in people who have high blood pressure readings, are of African descent, or are overweight.  Have your blood pressure checked: ? Every 3-5 years if you are 39-77 years of age. ? Every year if you are 52 years old or older. Eat a healthy diet  Eat a diet that includes plenty of vegetables, fruits, low-fat dairy products, and lean protein.  Do not eat a lot of foods that are high in solid fats, added sugars, or sodium.   Get regular exercise Get regular exercise. This is one of the most important things you can do for your health. Most adults should:  Try to exercise for at least 150 minutes each week. The exercise should increase your heart rate and make you sweat (moderate-intensity exercise).  Try to do strengthening exercises at least twice each week. Do these in addition to the moderate-intensity exercise.  Spend less time sitting. Even light physical activity can be beneficial. Other tips  Work with your health care provider to achieve or maintain a healthy weight.  Do not use any products that contain nicotine or tobacco, such as cigarettes, e-cigarettes, and chewing tobacco. If you need help  quitting, ask your health care provider.  Know your numbers. Ask your health care provider to check your cholesterol and your blood sugar (glucose). Continue to have your blood tested as directed by your health care provider. Do I need screening for cancer? Depending on your health history and family history, you may need to have cancer  screening at different stages of your life. This may include screening for:  Breast cancer.  Cervical cancer.  Lung cancer.  Colorectal cancer. What is my risk for osteoporosis? After menopause, you may be at increased risk for osteoporosis. Osteoporosis is a condition in which bone destruction happens more quickly than new bone creation. To help prevent osteoporosis or the bone fractures that can happen because of osteoporosis, you may take the following actions:  If you are 69-62 years old, get at least 1,000 mg of calcium and at least 600 mg of vitamin D per day.  If you are older than age 61 but younger than age 95, get at least 1,200 mg of calcium and at least 600 mg of vitamin D per day.  If you are older than age 50, get at least 1,200 mg of calcium and at least 800 mg of vitamin D per day. Smoking and drinking excessive alcohol increase the risk of osteoporosis. Eat foods that are rich in calcium and vitamin D, and do weight-bearing exercises several times each week as directed by your health care provider. How does menopause affect my mental health? Depression may occur at any age, but it is more common as you become older. Common symptoms of depression include:  Low or sad mood.  Changes in sleep patterns.  Changes in appetite or eating patterns.  Feeling an overall lack of motivation or enjoyment of activities that you previously enjoyed.  Frequent crying spells. Talk with your health care provider if you think that you are experiencing depression. General instructions See your health care provider for regular wellness exams and vaccines. This may include:  Scheduling regular health, dental, and eye exams.  Getting and maintaining your vaccines. These include: ? Influenza vaccine. Get this vaccine each year before the flu season begins. ? Pneumonia vaccine. ? Shingles vaccine. ? Tetanus, diphtheria, and pertussis (Tdap) booster vaccine. Your health care provider  may also recommend other immunizations. Tell your health care provider if you have ever been abused or do not feel safe at home. Summary  Menopause is a normal process in which your ability to get pregnant comes to an end.  This condition causes hot flashes, night sweats, decreased interest in sex, mood swings, headaches, or lack of sleep.  Treatment for this condition may include hormone replacement therapy.  Take actions to keep yourself healthy, including exercising regularly, eating a healthy diet, watching your weight, and checking your blood pressure and blood sugar levels.  Get screened for cancer and depression. Make sure that you are up to date with all your vaccines. This information is not intended to replace advice given to you by your health care provider. Make sure you discuss any questions you have with your health care provider. Document Revised: 07/14/2018 Document Reviewed: 07/14/2018 Elsevier Patient Education  2021 [employer].

## 2020-12-05 DIAGNOSIS — Z0001 Encounter for general adult medical examination with abnormal findings: Secondary | ICD-10-CM | POA: Insufficient documentation

## 2020-12-05 DIAGNOSIS — E1169 Type 2 diabetes mellitus with other specified complication: Secondary | ICD-10-CM | POA: Insufficient documentation

## 2020-12-05 DIAGNOSIS — Z0000 Encounter for general adult medical examination without abnormal findings: Secondary | ICD-10-CM | POA: Insufficient documentation

## 2020-12-05 DIAGNOSIS — M25522 Pain in left elbow: Secondary | ICD-10-CM | POA: Insufficient documentation

## 2020-12-05 DIAGNOSIS — E118 Type 2 diabetes mellitus with unspecified complications: Secondary | ICD-10-CM | POA: Insufficient documentation

## 2020-12-05 NOTE — Assessment & Plan Note (Signed)
Ongoing productive cough in known COPD, asthma and continued smoker.  Will Rx short prednisone taper, doxycycline.  Has doxy listed as allergy but she does not remember if this is true allergy. Agrees to retrial - discussed photosensitivity precautions, GI side effects, update with effect. She will closely monitor for any reaction.

## 2020-12-05 NOTE — Assessment & Plan Note (Signed)
New diagnosis, previously in prediabetes range. Attributes to poor high sugar diet over the last several months.  Will renew efforts for healthy diet chances.  Recommend RTC 6 mo DM f/u visit.

## 2020-12-05 NOTE — Assessment & Plan Note (Addendum)
Chronic, TSH adequate off medication.  H/o hyperthyroidism previously saw endo.

## 2020-12-05 NOTE — Assessment & Plan Note (Signed)
Continued 1/2 ppd smoker - and total 30+ PY hx.  Continue to encourage full cessation. Discussed nicotine replacement therapies.  Will be eligible for lung cancer screening at age 50yo

## 2020-12-05 NOTE — Assessment & Plan Note (Signed)
Followed by cardiology, on statin, aspirin, imdur.  

## 2020-12-05 NOTE — Assessment & Plan Note (Addendum)
Preventative protocols reviewed and updated unless pt declined. Discussed healthy diet and lifestyle.  Sent home today with iFOB  Mammogram ordered, # provided to schedule  She declines well woman exam - will reschedule.

## 2020-12-05 NOTE — Assessment & Plan Note (Signed)
Has seen allergist, on singulair, symbicort, spiriva, flonase, antihistamine, and albuterol PRN. Encouraged smoking cessation.

## 2020-12-05 NOTE — Assessment & Plan Note (Addendum)
Exam not consistent with epicondylitis or olecranon bursitis, FROM at elbow. rec voltaren gel, continued sleeve use, monitor effect on prednisone.

## 2020-12-05 NOTE — Assessment & Plan Note (Deleted)
Followed by cardiology, on statin, aspirin, imdur.

## 2020-12-05 NOTE — Assessment & Plan Note (Addendum)
Previously noted on imaging.  Reviewed current regimen - will increase symbicort to 160mg  2 puffs BID (was using once daily).  Eligible for pneumonia shot - will defer in current illness See below re acute bronchitis.

## 2021-01-18 ENCOUNTER — Other Ambulatory Visit: Payer: Self-pay | Admitting: Family Medicine

## 2021-01-18 DIAGNOSIS — J302 Other seasonal allergic rhinitis: Secondary | ICD-10-CM

## 2021-02-13 ENCOUNTER — Inpatient Hospital Stay: Admission: RE | Admit: 2021-02-13 | Payer: 59 | Source: Ambulatory Visit

## 2021-02-13 ENCOUNTER — Other Ambulatory Visit: Payer: Self-pay | Admitting: Cardiovascular Disease

## 2021-02-14 ENCOUNTER — Other Ambulatory Visit: Payer: Self-pay | Admitting: Family Medicine

## 2021-02-20 ENCOUNTER — Telehealth: Payer: 59 | Admitting: Physician Assistant

## 2021-02-20 DIAGNOSIS — B9689 Other specified bacterial agents as the cause of diseases classified elsewhere: Secondary | ICD-10-CM

## 2021-02-20 DIAGNOSIS — J019 Acute sinusitis, unspecified: Secondary | ICD-10-CM

## 2021-02-20 MED ORDER — PREDNISONE 20 MG PO TABS: 40.0000 mg | ORAL_TABLET | Freq: Every day | ORAL | 0 refills | Status: DC

## 2021-02-20 MED ORDER — DOXYCYCLINE HYCLATE 100 MG PO CAPS: 100.0000 mg | ORAL_CAPSULE | Freq: Two times a day (BID) | ORAL | 0 refills | Status: DC

## 2021-02-20 NOTE — Progress Notes (Signed)
I have spent 5 minutes in review of e-visit questionnaire, review and updating patient chart, medical decision making and response to patient.   Lydiann Bonifas Cody Daven Montz, PA-C    

## 2021-02-20 NOTE — Addendum Note (Signed)
Addended by: Waldon Merl on: 02/20/2021 11:50 AM   Modules accepted: Orders

## 2021-02-20 NOTE — Progress Notes (Signed)

## 2021-03-13 ENCOUNTER — Telehealth: Payer: Self-pay | Admitting: Family Medicine

## 2021-03-13 NOTE — Telephone Encounter (Signed)
Mrs. Makayla Green called in wanted to know about getting the tdap and the booster.

## 2021-03-13 NOTE — Telephone Encounter (Signed)
Spoke with pt about Tdap vaccine.  States her son is expecting a child and she needs the shot.  Pt wants to get shot at other son's [facility]) OV with Dr. Alphonsus Sias on 03/25/21 at 3:00.

## 2021-03-25 ENCOUNTER — Ambulatory Visit (INDEPENDENT_AMBULATORY_CARE_PROVIDER_SITE_OTHER): Payer: 59

## 2021-03-25 ENCOUNTER — Other Ambulatory Visit: Payer: Self-pay

## 2021-03-25 DIAGNOSIS — Z23 Encounter for immunization: Secondary | ICD-10-CM

## 2021-04-10 ENCOUNTER — Telehealth: Payer: Self-pay | Admitting: Family Medicine

## 2021-04-10 NOTE — Telephone Encounter (Signed)
Pt wanted to know if she was up to date on all of her vaccines

## 2021-04-11 NOTE — Telephone Encounter (Signed)
Spoke with pt notifying her, per 12/04/20 OV note, pt was due for Td and pneumonia vaccines.  Pt came in for Tdap on 03/25/21.  Says she will try to get pneumonia shot done.

## 2021-04-13 ENCOUNTER — Other Ambulatory Visit: Payer: Self-pay | Admitting: Cardiovascular Disease

## 2021-04-26 ENCOUNTER — Ambulatory Visit: Payer: 59

## 2021-05-03 ENCOUNTER — Ambulatory Visit: Payer: 59

## 2021-05-08 ENCOUNTER — Telehealth: Payer: 59 | Admitting: Family

## 2021-05-08 DIAGNOSIS — J441 Chronic obstructive pulmonary disease with (acute) exacerbation: Secondary | ICD-10-CM

## 2021-05-08 MED ORDER — PREDNISONE 10 MG (21) PO TBPK: ORAL_TABLET | ORAL | 0 refills | Status: DC

## 2021-05-08 MED ORDER — BENZONATATE 100 MG PO CAPS: 100.0000 mg | ORAL_CAPSULE | Freq: Three times a day (TID) | ORAL | 0 refills | Status: DC | PRN

## 2021-05-08 NOTE — Progress Notes (Signed)

## 2021-06-18 ENCOUNTER — Telehealth: Payer: Self-pay | Admitting: Cardiovascular Disease

## 2021-06-18 MED ORDER — ISOSORBIDE MONONITRATE ER 30 MG PO TB24: 15.0000 mg | ORAL_TABLET | Freq: Every day | ORAL | 4 refills | Status: DC

## 2021-06-18 NOTE — Telephone Encounter (Signed)
Refill sent 06/18/21

## 2021-06-18 NOTE — Telephone Encounter (Signed)
*  STAT* If patient is at the pharmacy, call can be transferred to refill team.   1. Which medications need to be refilled? (please list name of each medication and dose if known) isosorbide mononitrate (IMDUR) 30 MG 24 hr tablet  2. Which pharmacy/location (including street and city if local pharmacy) is medication to be sent to?[id number] - [place], [place] - [address]  3. Do they need a 30 day or 90 day supply? 30 day  Refuses to see APP.

## 2021-06-25 ENCOUNTER — Telehealth: Payer: 59 | Admitting: Family Medicine

## 2021-06-25 DIAGNOSIS — B9789 Other viral agents as the cause of diseases classified elsewhere: Secondary | ICD-10-CM

## 2021-06-25 DIAGNOSIS — J329 Chronic sinusitis, unspecified: Secondary | ICD-10-CM | POA: Diagnosis not present

## 2021-06-25 MED ORDER — IPRATROPIUM BROMIDE 0.03 % NA SOLN: 2.0000 | Freq: Two times a day (BID) | NASAL | 0 refills | Status: DC

## 2021-06-25 MED ORDER — PREDNISONE 20 MG PO TABS: 40.0000 mg | ORAL_TABLET | Freq: Every day | ORAL | 0 refills | Status: AC

## 2021-06-25 NOTE — Progress Notes (Signed)
E-Visit for Sinus Problems  We are sorry that you are not feeling well.  Here is how we plan to help!  Based on what you have shared with me it looks like you have sinusitis.  Sinusitis is inflammation and infection in the sinus cavities of the head.  Based on your presentation (less than 7 days) I believe you most likely have Acute Viral Sinusitis.This is an infection most likely caused by a virus. There is not specific treatment for viral sinusitis other than to help you with the symptoms until the infection runs its course.  You may use an oral decongestant such as Mucinex D or if you have glaucoma or high blood pressure use plain Mucinex. Saline nasal spray help and can safely be used as often as needed for congestion, I have prescribed: Ipratropium Bromide nasal spray 0.03% 2 sprays in eah nostril 2-3 times a day  Some authorities believe that zinc sprays or the use of Echinacea may shorten the course of your symptoms.  Sinus infections are not as easily transmitted as other respiratory infection, however we still recommend that you avoid close contact with loved ones, especially the very young and elderly.  Remember to wash your hands thoroughly throughout the day as this is the number one way to prevent the spread of infection!  Home Care: Only take medications as instructed by your medical team. Do not take these medications with alcohol. A steam or ultrasonic humidifier can help congestion.  You can place a towel over your head and breathe in the steam from hot water coming from a faucet. Avoid close contacts especially the very young and the elderly. Cover your mouth when you cough or sneeze. Always remember to wash your hands. Use tylenol or ibuprofen if able for aches and pains  Get Help Right Away If: You develop worsening fever or sinus pain. You develop a severe head ache or visual changes. Your symptoms persist after you have completed your treatment plan.  Make sure  you Understand these instructions. Will watch your condition. Will get help right away if you are not doing well or get worse.   Thank you for choosing an e-visit.  Your e-visit answers were reviewed by a board certified advanced clinical practitioner to complete your personal care plan. Depending upon the condition, your plan could have included both over the counter or prescription medications.  Please review your pharmacy choice. Make sure the pharmacy is open so you can pick up prescription now. If there is a problem, you may contact your provider through [employer] and have the prescription routed to another pharmacy.  Your safety is important to [place]. If you have drug allergies check your prescription carefully.   For the next 24 hours you can use MyChart to ask questions about today's visit, request a non-urgent call back, or ask for a work or school excuse. You will get an email in the next two days asking about your experience. I hope that your e-visit has been valuable and will speed your recovery.  I provided 5 minutes of non face-to-face time during this encounter for chart review, medication and order placement, as well as and documentation.

## 2021-06-25 NOTE — Addendum Note (Signed)
Addended by: Freddy Finner on: 06/25/2021 03:15 PM   Modules accepted: Orders

## 2021-07-09 ENCOUNTER — Other Ambulatory Visit: Payer: Self-pay | Admitting: Cardiovascular Disease

## 2021-07-15 ENCOUNTER — Telehealth: Payer: 59 | Admitting: Physician Assistant

## 2021-07-15 DIAGNOSIS — J019 Acute sinusitis, unspecified: Secondary | ICD-10-CM

## 2021-07-15 MED ORDER — PREDNISONE 50 MG PO TABS: 50.0000 mg | ORAL_TABLET | Freq: Every day | ORAL | 0 refills | Status: AC

## 2021-07-15 MED ORDER — DOXYCYCLINE HYCLATE 100 MG PO CAPS
100.0000 mg | ORAL_CAPSULE | Freq: Two times a day (BID) | ORAL | 0 refills | Status: DC
Start: 2021-07-15 — End: 2021-07-15

## 2021-07-15 MED ORDER — DOXYCYCLINE HYCLATE 100 MG PO TABS: 100.0000 mg | ORAL_TABLET | Freq: Two times a day (BID) | ORAL | 0 refills | Status: DC

## 2021-07-15 NOTE — Addendum Note (Signed)
Addended by: Jannifer Rodney A on: 07/15/2021 04:47 PM   Modules accepted: Orders

## 2021-07-15 NOTE — Progress Notes (Signed)
E-Visit for Sinus Problems  We are sorry that you are not feeling well.  Here is how we plan to help!  Based on what you have shared with me it looks like you have sinusitis.  Sinusitis is inflammation and infection in the sinus cavities of the head.  Based on your presentation I believe you most likely have Acute Bacterial Sinusitis.  This is an infection caused by bacteria and is treated with antibiotics. I have prescribed Doxycycline 100mg  by mouth twice a day for 10 days. I have prescribed a course of Prednisone as well. You may use an oral decongestant such as Mucinex D or if you have glaucoma or high blood pressure use plain Mucinex. Saline nasal spray help and can safely be used as often as needed for congestion.  If you develop worsening sinus pain, fever or notice severe headache and vision changes, or if symptoms are not better after completion of antibiotic, please schedule an appointment with a health care provider.    Sinus infections are not as easily transmitted as other respiratory infection, however we still recommend that you avoid close contact with loved ones, especially the very young and elderly.  Remember to wash your hands thoroughly throughout the day as this is the number one way to prevent the spread of infection!  Home Care: Only take medications as instructed by your medical team. Complete the entire course of an antibiotic. Do not take these medications with alcohol. A steam or ultrasonic humidifier can help congestion.  You can place a towel over your head and breathe in the steam from hot water coming from a faucet. Avoid close contacts especially the very young and the elderly. Cover your mouth when you cough or sneeze. Always remember to wash your hands.  Get Help Right Away If: You develop worsening fever or sinus pain. You develop a severe head ache or visual changes. Your symptoms persist after you have completed your treatment plan.  Make sure  you Understand these instructions. Will watch your condition. Will get help right away if you are not doing well or get worse.  Thank you for choosing an e-visit.  Your e-visit answers were reviewed by a board certified advanced clinical practitioner to complete your personal care plan. Depending upon the condition, your plan could have included both over the counter or prescription medications.  Please review your pharmacy choice. Make sure the pharmacy is open so you can pick up prescription now. If there is a problem, you may contact your provider through and have the prescription routed to another pharmacy.  Your safety is important to [employer]. If you have drug allergies check your prescription carefully.   For the next 24 hours you can use MyChart to ask questions about today's visit, request a non-urgent call back, or ask for a work or school excuse. You will get an email in the next two days asking about your experience. I hope that your e-visit has been valuable and will speed your recovery.  Approximately 5 minutes was spent documenting and reviewing patient's chart.

## 2021-08-27 NOTE — Progress Notes (Signed)
Office Visit    Patient Name: Makayla Green Date of Encounter: 08/28/2021  PCP:  Eustaquio Boyden, MD   [facility]  Cardiologist:  Thurmon Fair, MD  Advanced Practice Provider:  No care team member to display Electrophysiologist:  None    HPI    Makayla Green is a 51 y.o. female with a hx of premature onset CAD with prominent coronary calcification on CT but only minor stenosis on coronary angiography April 2018, ongoing tobacco abuse, and nitrate responsive chest pain possibly due to coronary spasm presents today for 1 year follow-up appointment.   At her last appointment she had not been troubled by coronary spasm in a year.  She had good response to very low-dose, long-acting nitrates.  She is not taking sublingual nitroglycerin a long time.  Her previous angina pattern was pain in her left shoulder radiating towards her scapular area.  She did however continue to smoke half a pack to a pack of cigarettes a day.  She has tried multiple times to quit including Chantix, Wellbutrin, nicotine supplements, each time unsuccessfully due to severe mood swings.  She is allergic to the nicotine patches and was thinking of using a nicotine inhaler.  She has tried vaping and developed bronchitis.  Today, she feel okay without any cardiac symptoms. She very occasionally has the left shoulder pain which subsides quickly. She has not had any shortness of breath, lower extremity edema, or any other cardiac symptoms over the last year. She has 7 rescue dogs at home and she looks after her three grandchildren on the weekends. BP is well controlled today and EKG shows NSR, rate is in the 90s. This is her usual rate and it was higher after her COVID infection back in 2021. Over the last year her HR has progressively decreased. She was never symptomatic from her sinus tachycardia. She did have a monitor placed back in January of 2021 which showed rare PVCs and PACs. She  otherwise has been feeling well.   Reports no shortness of breath nor dyspnea on exertion. Reports no chest pain, pressure, or tightness. No edema, orthopnea, PND. Reports no palpitations.    Past Medical History    Past Medical History:  Diagnosis Date   Abnormal pap    LEEP 2005   Asthma    Coronary artery disease    History of Bell's palsy 1990s   per pt   Recurrent upper respiratory infection (URI)    Solitary pulmonary nodule 07/06/2014    4 mm pulmonary nodule seen on November 2015 CT chest    Tobacco use disorder    Past Surgical History:  Procedure Laterality Date   CARDIAC CATHETERIZATION N/A 11/14/2015   Procedure: Left Heart Cath and Coronary Angiography;  Surgeon: Iran Ouch, MD;  Location: [facility] INVAS[facility];  Service: Cardiovascular;  Laterality: N/A;    Allergies  Allergies  Allergen Reactions   Penicillins Shortness Of Breath and Swelling    Has patient had a PCN reaction causing immediate rash, facial/tongue/throat swelling, SOB or lightheadedness with hypotension: Yes Has patient had a PCN reaction causing severe rash involving mucus membranes or skin necrosis: No Has patient had a PCN reaction that required hospitalization No Has patient had a PCN reaction occurring within the last 10 years: No If all of the above answers are "NO", then may proceed with Cephalosporin use.    Amitriptyline     Hallucinations   Methylprednisolone Acetate Other (See Comments)  Positive reaction to skin prick/intradermal testing by allergist   Shellfish Allergy     Patient has not eaten any shellfish and does not know if she is allergic. She has had radiology images using dye with no reactions noted.     EKGs/Labs/Other Studies Reviewed:   The following studies were reviewed today:  Long-term monitor results 08/26/19  The recordings showed normal sinus rhythm with normal circadian variation. Very rare PACs and very rare PVCs were seen. There is no evidence of  atrial fibrillation or complex ventricular arrhythmia.   Normal arrhythmia monitor.  Very rare PACs and very rare PVCs, likely physiological amount, are seen.   EKG:  EKG is  ordered today.  The ekg ordered today demonstrates NSR.   Recent Labs: 11/29/2020: BUN 14; Creatinine, Ser 0.69; Hemoglobin 15.1; Platelets 334.0; Potassium 4.9; Sodium 141; TSH 0.68  Recent Lipid Panel    Component Value Date/Time   CHOL 167 04/01/2019 1501   TRIG 149 04/01/2019 1501   HDL 59 04/01/2019 1501   CHOLHDL 2.8 04/01/2019 1501   VLDL 15.2 09/22/2017 0846   LDLCALC 83 04/01/2019 1501     Home Medications   Current Meds  Medication Sig   albuterol (PROVENTIL HFA;VENTOLIN HFA) 108 (90 Base) MCG/ACT inhaler Inhale into the lungs. Every 6 hours as needed   aspirin EC 81 MG tablet Take 1 tablet (81 mg total) by mouth daily.   benzonatate (TESSALON PERLES) 100 MG capsule Take 1 capsule (100 mg total) by mouth 3 (three) times daily as needed.   budesonide (PULMICORT) 0.5 MG/2ML nebulizer solution Take 2 mLs (0.5 mg total) by nebulization 2 (two) times daily as needed (during asthma flares/URIs for 1-2 weeks.).   budesonide-formoterol (SYMBICORT) 160-4.5 MCG/ACT inhaler Inhale 2 puffs into the lungs in the morning and at bedtime.   fexofenadine (ALLEGRA ALLERGY) 180 MG tablet Take 1 tablet (180 mg total) by mouth daily as needed for allergies or rhinitis.   fluticasone (FLONASE) 50 MCG/ACT nasal spray SPRAY 2 SPRAYS INTO EACH NOSTRIL EVERY DAY   isosorbide mononitrate (IMDUR) 30 MG 24 hr tablet TAKE 1/2 TABLET BY MOUTH EVERY DAY   rosuvastatin (CRESTOR) 5 MG tablet Take 1 tablet (5 mg total) by mouth daily at 6 PM. Pt needs to make appt with provider for further refills   Tiotropium Bromide Monohydrate (SPIRIVA RESPIMAT) 2.5 MCG/ACT AERS Inhale 2 puffs into the lungs daily.     Review of Systems      All other systems reviewed and are otherwise negative except as noted above.  Physical Exam    VS:   BP 118/72    Pulse 94    Ht 5\' 8"  (1.727 m)    Wt 163 lb 6.4 oz (74.1 kg)    SpO2 97%    BMI 24.84 kg/m  , BMI Body mass index is 24.84 kg/m.  Wt Readings from Last 3 Encounters:  08/28/21 163 lb 6.4 oz (74.1 kg)  12/04/20 159 lb 6 oz (72.3 kg)  08/15/20 157 lb (71.2 kg)     GEN: Well nourished, well developed, in no acute distress. HEENT: normal. Neck: Supple, no JVD, carotid bruits, or masses. Cardiac: RRR, no murmurs, rubs, or gallops. No clubbing, cyanosis, edema.  Radials/PT 2+ and equal bilaterally.  Respiratory:  Respirations regular and unlabored, clear to auscultation bilaterally. GI: Soft, nontender, nondistended. MS: No deformity or atrophy. Skin: Warm and dry, no rash. Neuro:  Strength and sensation are intact. Psych: Normal affect.  Assessment & Plan  Angina pectoralis -manifested as shoulder and back pain -very rarely occurs, has affected her much over the last year  Tachycardia -this started with her COVID infection back in 2021. She states it increased to 150s bpm and she was asymptomatic. Since then it has slowly decreased and she is usually in the 90s. She does not have symptoms.  -BP well controlled today 118/72  Hyperlipidemia -Last lipid panel was back in 2020. She is having blood work done by her PCP in a few weeks. I reminded her to add on a Lipid panel at that time.   Tobacco abuse -We discussed cessation and it seems like she has tried about everything.  -She is down to less than 1/2 pack per day since she no longer smokes when she is in the house    Disposition: Follow up 1 year with Thurmon Fair, MD or APP.  Signed, Sharlene Dory, PA-C 08/28/2021, 11:09 AM [facility]

## 2021-08-28 ENCOUNTER — Ambulatory Visit: Payer: 59 | Admitting: Physician Assistant

## 2021-08-28 ENCOUNTER — Other Ambulatory Visit: Payer: Self-pay

## 2021-08-28 VITALS — BP 118/72 | HR 94 | Ht 68.0 in | Wt 163.4 lb

## 2021-08-28 DIAGNOSIS — F17200 Nicotine dependence, unspecified, uncomplicated: Secondary | ICD-10-CM | POA: Diagnosis not present

## 2021-08-28 DIAGNOSIS — R000 Tachycardia, unspecified: Secondary | ICD-10-CM

## 2021-08-28 DIAGNOSIS — R079 Chest pain, unspecified: Secondary | ICD-10-CM | POA: Diagnosis not present

## 2021-08-28 DIAGNOSIS — E785 Hyperlipidemia, unspecified: Secondary | ICD-10-CM | POA: Diagnosis not present

## 2021-08-28 NOTE — Patient Instructions (Signed)
Medication Instructions:  Your physician recommends that you continue on your current medications as directed. Please refer to the Current Medication list given to you today.   *If you need a refill on your cardiac medications before your next appointment, please call your pharmacy*   Lab Work: NONE ordered at this time of appointment   If you have labs (blood work) drawn today and your tests are completely normal, you will receive your results only by: MyChart Message (if you have MyChart) OR A paper copy in the mail If you have any lab test that is abnormal or we need to change your treatment, we will call you to review the results.   Testing/Procedures: NONE ordered at this time of appointment     Follow-Up: At [facility], you and your health needs are our priority.  As part of our continuing mission to provide you with exceptional heart care, we have created designated Provider Care Teams.  These Care Teams include your primary Cardiologist (physician) and Advanced Practice Providers (APPs -  Physician Assistants and Nurse Practitioners) who all work together to provide you with the care you need, when you need it.  We recommend signing up for the patient portal called "MyChart".  Sign up information is provided on this After Visit Summary.  MyChart is used to connect with patients for Virtual Visits (Telemedicine).  Patients are able to view lab/test results, encounter notes, upcoming appointments, etc.  Non-urgent messages can be sent to your provider as well.   To learn more about what you can do with MyChart, go to [email or web address].    Your next appointment:   1 year(s)  The format for your next appointment:   In Person  Provider:   Thurmon Fair, MD     Other Instructions Reminder-When you have your office visit with your Primary Care Doctor, please have them add a lipid panel to your other scheduled lab work.

## 2021-09-17 ENCOUNTER — Other Ambulatory Visit: Payer: Self-pay | Admitting: Cardiovascular Disease

## 2021-10-09 ENCOUNTER — Telehealth: Payer: 59 | Admitting: Physician Assistant

## 2021-10-09 DIAGNOSIS — J209 Acute bronchitis, unspecified: Secondary | ICD-10-CM | POA: Diagnosis not present

## 2021-10-09 DIAGNOSIS — J432 Centrilobular emphysema: Secondary | ICD-10-CM

## 2021-10-09 MED ORDER — BENZONATATE 100 MG PO CAPS: 100.0000 mg | ORAL_CAPSULE | Freq: Three times a day (TID) | ORAL | 0 refills | Status: DC | PRN

## 2021-10-09 MED ORDER — PREDNISONE 20 MG PO TABS: 40.0000 mg | ORAL_TABLET | Freq: Every day | ORAL | 0 refills | Status: DC

## 2021-10-09 NOTE — Progress Notes (Signed)
We are sorry that you are not feeling well.  Here is how we plan to help! ? ?Based on your presentation I believe you most likely have A cough due to a virus.  This is called viral bronchitis and is best treated by rest, plenty of fluids and control of the cough.  You may use Ibuprofen or Tylenol as directed to help your symptoms.   ?  ?In addition you may use A prescription cough medication called Tessalon Perles 100mg . You may take 1-2 capsules every 8 hours as needed for your cough. ? ?Prednisone 20mg  Take 2 tablets (40mg ) every morning with breakfast for 7 days. ? ?From your responses in the eVisit questionnaire you describe inflammation in the upper respiratory tract which is causing a significant cough.  This is commonly called Bronchitis and has four common causes:   ?Allergies ?Viral Infections ?Acid Reflux ?Bacterial Infection ?Allergies, viruses and acid reflux are treated by controlling symptoms or eliminating the cause. An example might be a cough caused by taking certain blood pressure medications. You stop the cough by changing the medication. Another example might be a cough caused by acid reflux. Controlling the reflux helps control the cough. ? ?USE OF BRONCHODILATOR ("RESCUE") INHALERS: ?There is a risk from using your bronchodilator too frequently.  The risk is that over-reliance on a medication which only relaxes the muscles surrounding the breathing tubes can reduce the effectiveness of medications prescribed to reduce swelling and congestion of the tubes themselves.  Although you feel brief relief from the bronchodilator inhaler, your asthma may actually be worsening with the tubes becoming more swollen and filled with mucus.  This can delay other crucial treatments, such as oral steroid medications. If you need to use a bronchodilator inhaler daily, several times per day, you should discuss this with your provider.  There are probably better treatments that could be used to keep your asthma  under control.  ?   ?HOME CARE ?Only take medications as instructed by your medical team. ?Complete the entire course of an antibiotic. ?Drink plenty of fluids and get plenty of rest. ?Avoid close contacts especially the very young and the elderly ?Cover your mouth if you cough or cough into your sleeve. ?Always remember to wash your hands ?A steam or ultrasonic humidifier can help congestion.  ? ?GET HELP RIGHT AWAY IF: ?You develop worsening fever. ?You become short of breath ?You cough up blood. ?Your symptoms persist after you have completed your treatment plan ?MAKE SURE YOU  ?Understand these instructions. ?Will watch your condition. ?Will get help right away if you are not doing well or get worse. ?  ? ?Thank you for choosing an e-visit. ? ?Your e-visit answers were reviewed by a board certified advanced clinical practitioner to complete your personal care plan. Depending upon the condition, your plan could have included both over the counter or prescription medications. ? ?Please review your pharmacy choice. Make sure the pharmacy is open so you can pick up prescription now. If there is a problem, you may contact your provider through and have the prescription routed to another pharmacy.  Your safety is important to . If you have drug allergies check your prescription carefully.  ? ?For the next 24 hours you can use MyChart to ask questions about today's visit, request a non-urgent call back, or ask for a work or school excuse. ?You will get an email in the next two days asking about your experience. I hope that your e-visit  has been valuable and will speed your recovery. ? ?I provided 5 minutes of non face-to-face time during this encounter for chart review and documentation.  ? ?

## 2021-11-08 ENCOUNTER — Other Ambulatory Visit: Payer: Self-pay | Admitting: Cardiovascular Disease

## 2021-11-08 ENCOUNTER — Other Ambulatory Visit: Payer: Self-pay | Admitting: Family Medicine

## 2021-11-08 MED ORDER — IPRATROPIUM BROMIDE 0.03 % NA SOLN: 2.0000 | Freq: Two times a day (BID) | NASAL | 0 refills | Status: DC

## 2021-11-08 MED ORDER — ISOSORBIDE MONONITRATE ER 30 MG PO TB24: 15.0000 mg | ORAL_TABLET | Freq: Every day | ORAL | 0 refills | Status: DC

## 2021-11-18 ENCOUNTER — Telehealth: Payer: Self-pay | Admitting: Cardiovascular Disease

## 2021-11-18 NOTE — Telephone Encounter (Signed)
Pt c/o medication issue: ? ?1. Name of Medication: isosorbide mononitrate (IMDUR) 30 MG 24 hr tablet ? ?2. How are you currently taking this medication (dosage and times per day)? Take 0.5 tablets (15 mg total) by mouth daily. ? ?3. Are you having a reaction (difficulty breathing--STAT)? no ? ?4. What is your medication issue? Patient calling in because she being told that this medication is being discontinue. Please advise  ? ?

## 2021-11-18 NOTE — Telephone Encounter (Signed)
Try taking half a white pill twice a day. ?If that does not work we can prescribe her isosorbide dinitrate 10 mg 3 times daily. ?

## 2021-11-18 NOTE — Telephone Encounter (Signed)
Returned the call to the patient. She stated that the current manufacturer (pink one) of the Imdur has stopped making the medication. She stated that the "white pill" does not work as well. She has been asked if she tried a whole tablet but stated that her blood pressure gets low.  ? ?She wants to know if there is something else she can take or if she should try taking half in the morning and half a night.  ? ? ?

## 2021-11-19 MED ORDER — ISOSORBIDE MONONITRATE ER 30 MG PO TB24: 30.0000 mg | ORAL_TABLET | Freq: Every day | ORAL | 0 refills | Status: DC

## 2021-11-19 NOTE — Telephone Encounter (Signed)
The patient will try the half a tablet twice a day and monitor her blood pressure. She will keep [place] updated through MyChart.  ?

## 2021-11-26 ENCOUNTER — Other Ambulatory Visit: Payer: Self-pay | Admitting: Cardiovascular Disease

## 2021-12-09 ENCOUNTER — Other Ambulatory Visit: Payer: Self-pay | Admitting: Cardiovascular Disease

## 2021-12-10 ENCOUNTER — Other Ambulatory Visit: Payer: Self-pay | Admitting: Cardiovascular Disease

## 2021-12-10 ENCOUNTER — Other Ambulatory Visit: Payer: Self-pay | Admitting: Family Medicine

## 2021-12-14 ENCOUNTER — Encounter (INDEPENDENT_AMBULATORY_CARE_PROVIDER_SITE_OTHER): Payer: Self-pay | Admitting: Urgent Care

## 2021-12-14 ENCOUNTER — Telehealth: Payer: 59 | Admitting: Urgent Care

## 2021-12-14 DIAGNOSIS — J0101 Acute recurrent maxillary sinusitis: Secondary | ICD-10-CM | POA: Diagnosis not present

## 2021-12-14 MED ORDER — LEVOFLOXACIN 500 MG PO TABS: 500.0000 mg | ORAL_TABLET | Freq: Every day | ORAL | 0 refills | Status: AC

## 2021-12-14 MED ORDER — PREDNISONE 50 MG PO TABS: 50.0000 mg | ORAL_TABLET | Freq: Every day | ORAL | 0 refills | Status: AC

## 2021-12-14 NOTE — Progress Notes (Signed)
E-Visit for Sinus Problems ? ?We are sorry that you are not feeling well.  Here is how we plan to help! ? ?Based on what you have shared with me it looks like you have sinusitis.  Sinusitis is inflammation and infection in the sinus cavities of the head.  Based on your presentation I believe you most likely have Acute Bacterial Sinusitis.  This is an infection caused by bacteria and is treated with antibiotics. I have prescribed Levofloxicin 500mg  by mouth once daily for 7 days. You may use an oral decongestant such as Mucinex D or if you have glaucoma or high blood pressure use plain Mucinex. Saline nasal spray help and can safely be used as often as needed for congestion.  If you develop worsening sinus pain, fever or notice severe headache and vision changes, or if symptoms are not better after completion of antibiotic, please schedule an appointment with a health care provider.   ? ?Please note, it appears that your have been on doxycycline numerous times over the past year. In order to prevent resistance to this antibiotic, an alternative has been chosen. This is a very potent respiratory antibiotic. Unfortunately, you cannot take steroids while on this antibiotic, it is contraindicated. Also, there are many adverse events that can occur in patients that take high dose steroids often, including osteoporosis, glaucoma, diabetes to name a few. Avoid excessive sunlight and drink plenty of water on this antibiotic. If you develop any pain in your heel, or pins and needles in your extremities, please stop the medication and follow up with your PCP. ? ?Your biggest risk factor for recurrence of sinus infections is cigarette smoking. Please discuss cessation techniques with your PCP as this will certainly help prevent recurrence.  ? ?Please continue all your home medications, including flonase and atrovent nasal sprays ? ?Sinus infections are not as easily transmitted as other respiratory infection, however we still  recommend that you avoid close contact with loved ones, especially the very young and elderly.  Remember to wash your hands thoroughly throughout the day as this is the number one way to prevent the spread of infection! ? ?Home Care: ?Only take medications as instructed by your medical team. ?Complete the entire course of an antibiotic. ?Do not take these medications with alcohol. ?A steam or ultrasonic humidifier can help congestion.  You can place a towel over your head and breathe in the steam from hot water coming from a faucet. ?Avoid close contacts especially the very young and the elderly. ?Cover your mouth when you cough or sneeze. ?Always remember to wash your hands. ? ?Get Help Right Away If: ?You develop worsening fever or sinus pain. ?You develop a severe head ache or visual changes. ?Your symptoms persist after you have completed your treatment plan. ? ?Make sure you ?Understand these instructions. ?Will watch your condition. ?Will get help right away if you are not doing well or get worse. ? ?Thank you for choosing an e-visit. ? ?Your e-visit answers were reviewed by a board certified advanced clinical practitioner to complete your personal care plan. Depending upon the condition, your plan could have included both over the counter or prescription medications. ? ?Please review your pharmacy choice. Make sure the pharmacy is open so you can pick up prescription now. If there is a problem, you may contact your provider through and have the prescription routed to another pharmacy.  Your safety is important to [employer]. If you have drug allergies check your prescription  carefully.  ? ?For the next 24 hours you can use MyChart to ask questions about today's visit, request a non-urgent call back, or ask for a work or school excuse. ?You will get an email in the next two days asking about your experience. I hope that your e-visit has been valuable and will speed your recovery.  ? ?I have spent 9  minutes in review of e-visit questionnaire, review and updating patient chart, medical decision making and response to patient.  ? ?Damario Gillie L Khole Arterburn, PA ? ?  ?

## 2021-12-14 NOTE — Addendum Note (Signed)
Addended by: Guy Sandifer on: 12/14/2021 01:14 PM ? ? Modules accepted: Orders ? ?

## 2021-12-20 ENCOUNTER — Encounter: Payer: Self-pay | Admitting: Cardiovascular Disease

## 2021-12-20 NOTE — Telephone Encounter (Signed)
OK to switch to isosorbide mononitrate 20 mg twice daily (ismo). If not covered, can do isosorbide dinitrate 20 mg twice daily (isordil)

## 2021-12-23 MED ORDER — ISOSORBIDE MONONITRATE 20 MG PO TABS: 20.0000 mg | ORAL_TABLET | Freq: Two times a day (BID) | ORAL | 1 refills | Status: DC

## 2022-01-21 ENCOUNTER — Encounter: Payer: Self-pay | Admitting: Family Medicine

## 2022-01-21 ENCOUNTER — Telehealth (INDEPENDENT_AMBULATORY_CARE_PROVIDER_SITE_OTHER): Payer: 59 | Admitting: Family Medicine

## 2022-01-21 VITALS — Wt 155.0 lb

## 2022-01-21 DIAGNOSIS — R519 Headache, unspecified: Secondary | ICD-10-CM

## 2022-01-21 DIAGNOSIS — R0981 Nasal congestion: Secondary | ICD-10-CM | POA: Diagnosis not present

## 2022-01-21 MED ORDER — DOXYCYCLINE HYCLATE 100 MG PO TABS: 100.0000 mg | ORAL_TABLET | Freq: Two times a day (BID) | ORAL | 0 refills | Status: DC

## 2022-01-21 NOTE — Progress Notes (Signed)
Virtual Visit via Video Note  I connected with Makayla Green  on 01/21/22 at 11:00 AM EDT by a video enabled telemedicine application and verified that I am speaking with the correct person using two identifiers.  Location patient: [place] Location provider:work or home office Persons participating in the virtual visit: patient, provider  I discussed the limitations and requested verbal permission for telemedicine visit. The patient expressed understanding and agreed to proceed.   HPI:  Acute telemedicine visit for "sinus issues": -Onset: last week after visit to the beach, chronic congestion but worse the last 4-5 days with sinus pain -Symptoms include: nasal congestion, thick - white, sinus discomfort, headache - was bad but now better and more sinus, sinus pressure R maxillary, worse with leaning forward (tylenol and ibuprofen help) -Denies: fever, malaise, nausea, vomiting, vision disturbance, weakness, numbness -reports had sinusitis recently and instead of giving abx they gave steroids, about 1 month ago, reports doesn't think it cleared it up -has appointment with ENT coming up for her recurrent sinus issues -she wants to do and abx -Pertinent past medical history: see below, history of recurrent sinus infections in the past -Pertinent medication allergies: Allergies  Allergen Reactions   Penicillins Shortness Of Breath and Swelling    Has patient had a PCN reaction causing immediate rash, facial/tongue/throat swelling, SOB or lightheadedness with hypotension: Yes Has patient had a PCN reaction causing severe rash involving mucus membranes or skin necrosis: No Has patient had a PCN reaction that required hospitalization No Has patient had a PCN reaction occurring within the last 10 years: No If all of the above answers are "NO", then may proceed with Cephalosporin use.    Amitriptyline     Hallucinations   Methylprednisolone Acetate Other (See Comments)    Positive reaction to skin  prick/intradermal testing by allergist   Shellfish Allergy     Patient has not eaten any shellfish and does not know if she is allergic. She has had radiology images using dye with no reactions noted.   -COVID-19 vaccine status:  Immunization History  Administered Date(s) Administered   Influenza, Seasonal, Injecte, Preservative Fre 06/28/2012   Influenza,inj,Quad PF,6+ Mos 05/11/2014, 05/12/2019, 06/01/2020   PPD Test 04/03/2015   Tdap 03/25/2021     ROS: See pertinent positives and negatives per HPI.  Past Medical History:  Diagnosis Date   Abnormal pap    LEEP 2005   Asthma    Coronary artery disease    History of Bell's palsy 1990s   per pt   Recurrent upper respiratory infection (URI)    Solitary pulmonary nodule 07/06/2014    4 mm pulmonary nodule seen on November 2015 CT chest    Tobacco use disorder     Past Surgical History:  Procedure Laterality Date   CARDIAC CATHETERIZATION N/A 11/14/2015   Procedure: Left Heart Cath and Coronary Angiography;  Surgeon: Iran Ouch, MD;  Location: [facility];  Service: Cardiovascular;  Laterality: N/A;     Current Outpatient Medications:    albuterol (PROVENTIL HFA;VENTOLIN HFA) 108 (90 Base) MCG/ACT inhaler, Inhale into the lungs. Every 6 hours as needed, Disp: , Rfl:    aspirin EC 81 MG tablet, Take 1 tablet (81 mg total) by mouth daily., Disp: 90 tablet, Rfl: 3   budesonide (PULMICORT) 0.5 MG/2ML nebulizer solution, Take 2 mLs (0.5 mg total) by nebulization 2 (two) times daily as needed (during asthma flares/URIs for 1-2 weeks.)., Disp: 60 mL, Rfl: 1   doxycycline (VIBRA-TABS) 100 MG  tablet, Take 1 tablet (100 mg total) by mouth 2 (two) times daily., Disp: 20 tablet, Rfl: 0   fexofenadine (ALLEGRA ALLERGY) 180 MG tablet, Take 1 tablet (180 mg total) by mouth daily as needed for allergies or rhinitis., Disp: , Rfl:    fluticasone (FLONASE) 50 MCG/ACT nasal spray, SPRAY 2 SPRAYS INTO EACH NOSTRIL EVERY DAY, Disp: 16  mL, Rfl: 3   ipratropium (ATROVENT) 0.03 % nasal spray, Place 2 sprays into both nostrils every 12 (twelve) hours., Disp: 30 mL, Rfl: 0   isosorbide mononitrate (ISMO) 20 MG tablet, Take 1 tablet (20 mg total) by mouth 2 (two) times daily at 10 AM and 5 PM., Disp: 60 tablet, Rfl: 1   rosuvastatin (CRESTOR) 5 MG tablet, TAKE 1 TABLET BY MOUTH DAILY AT 6PM NEEDS APPT FOR REFILLS, Disp: 30 tablet, Rfl: 11   SPIRIVA RESPIMAT 2.5 MCG/ACT AERS, INHALE 2 PUFFS BY MOUTH INTO THE LUNGS DAILY, Disp: 4 g, Rfl: 2   SYMBICORT 160-4.5 MCG/ACT inhaler, INHALE 2 PUFFS INTO THE LUNGS IN THE MORNING AND AT BEDTIME., Disp: 10.2 each, Rfl: 2  EXAM:  VITALS per patient if applicable:denies fever  GENERAL: alert, oriented, appears well and in no acute distress  HEENT: atraumatic, conjunttiva clear, no obvious abnormalities on inspection of external nose and ears  NECK: normal movements of the head and neck  LUNGS: on inspection no signs of respiratory distress, breathing rate appears normal, no obvious gross SOB, gasping or wheezing  CV: no obvious cyanosis  MS: moves all visible extremities without noticeable abnormality  PSYCH/NEURO: pleasant and cooperative, no obvious depression or anxiety, speech and thought processing grossly intact  ASSESSMENT AND PLAN:  Discussed the following assessment and plan:  Nasal congestion  Facial pain  -we discussed possible serious and likely etiologies, options for evaluation and workup, limitations of telemedicine visit vs in person visit, treatment, treatment risks and precautions. Pt is agreeable to treatment via telemedicine at this moment. Because she reported having some pretty severe head/facial pain advised inperson evaluation. She is improved now and declines going for inperson care and instead prefers to try abx, understand risk - explained at length along with other potential etiologies. Also suggested covid testing and discussed can contact [facility] if positive and in 1st 5 days. She agrees to  seek prompt  in person care if worsening, recurrent pain, new symptoms arise, or if is not improving with treatment as expected per our conversation of expected course. Discussed options for follow up care. Did let this patient know that I do telemedicine on Tuesdays and Thursdays for [facility] and those are the days I am logged into the system. Advised to schedule follow up visit with PCP, [place] virtual visits or [facility] if any further questions or concerns to avoid delays in care.   I discussed the assessment and treatment plan with the patient. The patient was provided an opportunity to ask questions and all were answered. The patient agreed with the plan and demonstrated an understanding of the instructions.     Terressa Koyanagi, DO

## 2022-01-21 NOTE — Patient Instructions (Addendum)
-  I sent the medication(s) we discussed to your pharmacy: Meds ordered this encounter  Medications   doxycycline (VIBRA-TABS) 100 MG tablet    Sig: Take 1 tablet (100 mg total) by mouth 2 (two) times daily.    Dispense:  20 tablet    Refill:  0   Covid testing. Can contact [facility] if positive, in 1st 5 days of symptoms and you wish to take antiviral.   I hope you are feeling better soon!  Seek in person care promptly if your symptoms worsen, severe or worst headache or pain, new concerns arise or you are not improving with treatment.  It was nice to meet you today. I help [facility] out with telemedicine visits on Tuesdays and Thursdays and am happy to help if you need a virtual follow up visit on those days. Otherwise, if you have any concerns or questions following this visit please schedule a follow up visit with your Primary Care office or seek care at a local urgent care clinic to avoid delays in care. If you are having severe or life threatening symptoms please call 911 and/or go to the nearest emergency room.

## 2022-01-22 ENCOUNTER — Telehealth: Payer: 59 | Admitting: Nurse Practitioner

## 2022-01-22 DIAGNOSIS — J0140 Acute pansinusitis, unspecified: Secondary | ICD-10-CM

## 2022-01-22 MED ORDER — PREDNISONE 10 MG (21) PO TBPK: ORAL_TABLET | ORAL | 0 refills | Status: DC

## 2022-01-22 NOTE — Progress Notes (Signed)
E-Visit for Sinus Problems  We are sorry that you are not feeling well.  Here is how we plan to help!  Based on what you have shared with me it looks like you have sinusitis.  Sinusitis is inflammation and infection in the sinus cavities of the head.  Based on your presentation I believe you most likely have Acute Bacterial Sinusitis.  This is an infection caused by bacteria and is treated with antibiotics, please continue the doxycyline your primary care doctor started you on. I have prescribed  Meds ordered this encounter  Medications   predniSONE (STERAPRED UNI-PAK 21 TAB) 10 MG (21) TBPK tablet    Sig: Take 6 tablets on day one, 5 on day two, 4 on day three, 3 on day four, 2 on day five, and 1 on day six. Take with food.    Dispense:  21 tablet    Refill:  0   You may use an oral decongestant such as Mucinex D or if you have glaucoma or high blood pressure use plain Mucinex. Saline nasal spray help and can safely be used as often as needed for congestion.  If you develop worsening sinus pain, fever or notice severe headache and vision changes, or if symptoms are not better after completion of antibiotic, please schedule an appointment with a health care provider.    Sinus infections are not as easily transmitted as other respiratory infection, however we still recommend that you avoid close contact with loved ones, especially the very young and elderly.  Remember to wash your hands thoroughly throughout the day as this is the number one way to prevent the spread of infection!  Home Care: Only take medications as instructed by your medical team. Complete the entire course of an antibiotic. Do not take these medications with alcohol. A steam or ultrasonic humidifier can help congestion.  You can place a towel over your head and breathe in the steam from hot water coming from a faucet. Avoid close contacts especially the very young and the elderly. Cover your mouth when you cough or  sneeze. Always remember to wash your hands.  Get Help Right Away If: You develop worsening fever or sinus pain. You develop a severe head ache or visual changes. Your symptoms persist after you have completed your treatment plan.  Make sure you Understand these instructions. Will watch your condition. Will get help right away if you are not doing well or get worse.  Thank you for choosing an e-visit.  Your e-visit answers were reviewed by a board certified advanced clinical practitioner to complete your personal care plan. Depending upon the condition, your plan could have included both over the counter or prescription medications.  Please review your pharmacy choice. Make sure the pharmacy is open so you can pick up prescription now. If there is a problem, you may contact your provider through [employer] and have the prescription routed to another pharmacy.  Your safety is important to [place]. If you have drug allergies check your prescription carefully.   For the next 24 hours you can use MyChart to ask questions about today's visit, request a non-urgent call back, or ask for a work or school excuse. You will get an email in the next two days asking about your experience. I hope that your e-visit has been valuable and will speed your recovery.   I spent approximately 7 minutes reviewing the patient's history, current symptoms and coordinating their plan of care today.

## 2022-02-03 ENCOUNTER — Ambulatory Visit: Payer: 59 | Admitting: Family Medicine

## 2022-02-03 ENCOUNTER — Encounter: Payer: Self-pay | Admitting: Family Medicine

## 2022-02-03 VITALS — BP 90/60 | HR 106 | Temp 98.5°F | Ht 68.0 in | Wt 161.5 lb

## 2022-02-03 DIAGNOSIS — R519 Headache, unspecified: Secondary | ICD-10-CM | POA: Diagnosis not present

## 2022-02-03 DIAGNOSIS — E0590 Thyrotoxicosis, unspecified without thyrotoxic crisis or storm: Secondary | ICD-10-CM | POA: Diagnosis not present

## 2022-02-03 DIAGNOSIS — J432 Centrilobular emphysema: Secondary | ICD-10-CM | POA: Diagnosis not present

## 2022-02-03 LAB — TSH: TSH: 0.81 u[IU]/mL (ref 0.35–5.50)

## 2022-02-03 MED ORDER — SUMATRIPTAN SUCCINATE 100 MG PO TABS
100.0000 mg | ORAL_TABLET | Freq: Once | ORAL | 0 refills | Status: DC
Start: 2022-02-03 — End: 2022-08-08

## 2022-02-03 MED ORDER — PREDNISONE 10 MG PO TABS: ORAL_TABLET | ORAL | 0 refills | Status: DC

## 2022-02-03 MED ORDER — IPRATROPIUM BROMIDE 0.03 % NA SOLN: 2.0000 | Freq: Two times a day (BID) | NASAL | 0 refills | Status: DC

## 2022-02-03 NOTE — Progress Notes (Signed)
Patient ID: Makayla Green, female    DOB: [birth year], 51 y.o.   MRN: [id number]  This visit was conducted in person.  BP 90/60   Pulse (!) 106   Temp 98.5 F (36.9 C) (Oral)   Ht 5\' 8"  (1.727 m)   Wt 161 lb 8 oz (73.3 kg)   SpO2 95%   BMI 24.56 kg/m    CC:  Chief Complaint  Patient presents with   Follow-up    ER visit at [facility] on Saturday-Sharp Pain in Head and Facial Swelling-Finished Prednisone and Doxy for Sinus infection last Thursday but has pain about 2 hours after laying down at night. Elevated WBC at [facility].  Also wants thyroid labs checked    Subjective:   HPI: Makayla Green is a 51 y.o. female presenting on 02/03/2022 for Follow-up (ER visit at [facility] on Saturday-Sharp Pain in Head and Facial Swelling-Finished Prednisone and Doxy for Sinus infection last Thursday but has pain about 2 hours after laying down at night./Elevated WBC at [facility].  Also wants thyroid labs checked)  Reviewed  [facility] ED visit note from February 01, 2022.  She was seen with right-sided facial pain and headache.  She had been treated prior to this with 10 days of prednisone and doxycycline (also reviewed 6/20 video visit and 6/21 ED visit when this was prescribed) He went to the ED as her facial pressure and headache had recurred after completing antibiotics 24 hours prior.  She stated at that time that pain was worsened if she was lying flat. Wbc 18.7 Hg 15.9 CT face/sinuses:  1. No evidence of acute facial fracture.  2. No mass lesion or abnormal enhancement identified.  3. No substantial paranasal sinus disease or orbital abnormality, per clinical query.  CT venography: no dural sinus thrombosis Elevated white blood cells were felt to be due to recent prednisone.  No additional antibiotics were prescribed given no acute sinusitis seen on CT, no fever or other infectious symptoms. Recommended Flonase.  Today she reports she feels puffiness in face, pressure in face... when  lies down 2-4 hours later she has sharp pain in head and right face.  Feel like gums and nose are swollen shut.  No further   Pain in right ear.  She has tried nasocort, vick, nasal gel, saline, mucinex, allegra, sudafed. Ibuprofen and tylenol has helped.   History of hyperthyroidism previously saw Endo.  Patient requesting repeat  thyroid labs today Lab Results  Component Value Date   TSH 0.68 11/29/2020   History of sinus issue in last few years after  COVID. Hx of allergies.     Has appt with ENT  end of July.  She has history of remote migraines.  Relevant past medical, surgical, family and social history reviewed and updated as indicated. Interim medical history since our last visit reviewed. Allergies and medications reviewed and updated. Outpatient Medications Prior to Visit  Medication Sig Dispense Refill   albuterol (PROVENTIL HFA;VENTOLIN HFA) 108 (90 Base) MCG/ACT inhaler Inhale into the lungs. Every 6 hours as needed     aspirin EC 81 MG tablet Take 1 tablet (81 mg total) by mouth daily. 90 tablet 3   budesonide (PULMICORT) 0.5 MG/2ML nebulizer solution Take 2 mLs (0.5 mg total) by nebulization 2 (two) times daily as needed (during asthma flares/URIs for 1-2 weeks.). 60 mL 1   fexofenadine (ALLEGRA ALLERGY) 180 MG tablet Take 1 tablet (180 mg total) by mouth daily as needed for allergies  or rhinitis.     fluticasone (FLONASE) 50 MCG/ACT nasal spray SPRAY 2 SPRAYS INTO EACH NOSTRIL EVERY DAY 16 mL 3   ipratropium (ATROVENT) 0.03 % nasal spray Place 2 sprays into both nostrils every 12 (twelve) hours. 30 mL 0   isosorbide mononitrate (ISMO) 20 MG tablet Take 1 tablet (20 mg total) by mouth 2 (two) times daily at 10 AM and 5 PM. 60 tablet 1   promethazine-dextromethorphan (PROMETHAZINE-DM) 6.25-15 MG/5ML syrup Take 5 mLs by mouth every 4 (four) hours as needed.     pseudoephedrine-guaifenesin (MUCINEX D) 60-600 MG 12 hr tablet Take 1 tablet by mouth every 12 (twelve) hours.      rosuvastatin (CRESTOR) 5 MG tablet TAKE 1 TABLET BY MOUTH DAILY AT 6PM NEEDS APPT FOR REFILLS 30 tablet 11   SPIRIVA RESPIMAT 2.5 MCG/ACT AERS INHALE 2 PUFFS BY MOUTH INTO THE LUNGS DAILY 4 g 2   SYMBICORT 160-4.5 MCG/ACT inhaler INHALE 2 PUFFS INTO THE LUNGS IN THE MORNING AND AT BEDTIME. 10.2 each 2   doxycycline (VIBRA-TABS) 100 MG tablet Take 1 tablet (100 mg total) by mouth 2 (two) times daily. 20 tablet 0   predniSONE (STERAPRED UNI-PAK 21 TAB) 10 MG (21) TBPK tablet Take 6 tablets on day one, 5 on day two, 4 on day three, 3 on day four, 2 on day five, and 1 on day six. Take with food. 21 tablet 0   No facility-administered medications prior to visit.     Per HPI unless specifically indicated in ROS section below Review of Systems  Constitutional:  Negative for fatigue and fever.  HENT:  Negative for congestion.   Eyes:  Negative for pain.  Respiratory:  Negative for cough and shortness of breath.   Cardiovascular:  Negative for chest pain, palpitations and leg swelling.  Gastrointestinal:  Negative for abdominal pain.  Genitourinary:  Negative for dysuria and vaginal bleeding.  Musculoskeletal:  Negative for back pain.  Neurological:  Positive for headaches. Negative for syncope and light-headedness.  Psychiatric/Behavioral:  Negative for dysphoric mood.    Objective:  BP 90/60   Pulse (!) 106   Temp 98.5 F (36.9 C) (Oral)   Ht 5\' 8"  (1.727 m)   Wt 161 lb 8 oz (73.3 kg)   SpO2 95%   BMI 24.56 kg/m   Wt Readings from Last 3 Encounters:  02/03/22 161 lb 8 oz (73.3 kg)  01/21/22 155 lb (70.3 kg)  08/28/21 163 lb 6.4 oz (74.1 kg)      Physical Exam Constitutional:      General: She is not in acute distress.    Appearance: Normal appearance. She is well-developed. She is not ill-appearing or toxic-appearing.  HENT:     Head: Normocephalic.     Right Ear: Hearing, tympanic membrane, ear canal and external ear normal. Tympanic membrane is not erythematous, retracted or  bulging.     Left Ear: Hearing, tympanic membrane, ear canal and external ear normal. Tympanic membrane is not erythematous, retracted or bulging.     Nose: No mucosal edema or rhinorrhea.     Right Sinus: No maxillary sinus tenderness or frontal sinus tenderness.     Left Sinus: No maxillary sinus tenderness or frontal sinus tenderness.     Mouth/Throat:     Pharynx: Uvula midline.  Eyes:     General: Lids are normal. Lids are everted, no foreign bodies appreciated.     Conjunctiva/sclera: Conjunctivae normal.     Pupils: Pupils are equal, round,  and reactive to light.  Neck:     Thyroid: No thyroid mass or thyromegaly.     Vascular: No carotid bruit.     Trachea: Trachea normal.  Cardiovascular:     Rate and Rhythm: Normal rate and regular rhythm.     Pulses: Normal pulses.     Heart sounds: Normal heart sounds, S1 normal and S2 normal. No murmur heard.    No friction rub. No gallop.  Pulmonary:     Effort: Pulmonary effort is normal. No tachypnea or respiratory distress.     Breath sounds: Normal breath sounds. No decreased breath sounds, wheezing, rhonchi or rales.  Abdominal:     General: Bowel sounds are normal.     Palpations: Abdomen is soft.     Tenderness: There is no abdominal tenderness.  Musculoskeletal:     Cervical back: Normal range of motion and neck supple.  Skin:    General: Skin is warm and dry.     Findings: No rash.  Neurological:     Mental Status: She is alert.  Psychiatric:        Mood and Affect: Mood is not anxious or depressed.        Speech: Speech normal.        Behavior: Behavior normal. Behavior is cooperative.        Thought Content: Thought content normal.        Judgment: Judgment normal.       Results for orders placed or performed in visit on 11/29/20  Hepatitis C antibody  Result Value Ref Range   Hepatitis C Ab NON-REACTIVE NON-REACTIVE   SIGNAL TO CUT-OFF 0.00 <1.00  Basic metabolic panel  Result Value Ref Range   Sodium 141  135 - 145 mEq/L   Potassium 4.9 3.5 - 5.1 mEq/L   Chloride 105 96 - 112 mEq/L   CO2 28 19 - 32 mEq/L   Glucose, Bld 105 (H) 70 - 99 mg/dL   BUN 14 6 - 23 mg/dL   Creatinine, Ser 8.78 0.40 - 1.20 mg/dL   GFR 676.72 >09.47 mL/min   Calcium 10.4 8.4 - 10.5 mg/dL  CBC with Differential/Platelet  Result Value Ref Range   WBC 12.4 (H) 4.0 - 10.5 K/uL   RBC 4.66 3.87 - 5.11 Mil/uL   Hemoglobin 15.1 (H) 12.0 - 15.0 g/dL   HCT 09.6 28.3 - 66.2 %   MCV 96.3 78.0 - 100.0 fl   MCHC 33.6 30.0 - 36.0 g/dL   RDW 94.7 65.4 - 65.0 %   Platelets 334.0 150.0 - 400.0 K/uL   Neutrophils Relative % 61.9 43.0 - 77.0 %   Lymphocytes Relative 27.8 12.0 - 46.0 %   Monocytes Relative 7.7 3.0 - 12.0 %   Eosinophils Relative 1.7 0.0 - 5.0 %   Basophils Relative 0.9 0.0 - 3.0 %   Neutro Abs 7.7 1.4 - 7.7 K/uL   Lymphs Abs 3.5 0.7 - 4.0 K/uL   Monocytes Absolute 1.0 0.1 - 1.0 K/uL   Eosinophils Absolute 0.2 0.0 - 0.7 K/uL   Basophils Absolute 0.1 0.0 - 0.1 K/uL  Hemoglobin A1c  Result Value Ref Range   Hgb A1c MFr Bld 6.5 4.6 - 6.5 %  TSH  Result Value Ref Range   TSH 0.68 0.35 - 4.50 uIU/mL     COVID 19 screen:  No recent travel or known exposure to COVID19 The patient denies respiratory symptoms of COVID 19 at this time. The importance of social distancing  was discussed today.   Assessment and Plan    Problem List Items Addressed This Visit     Centrilobular emphysema (HCC)    Quit smoking!      Relevant Medications   promethazine-dextromethorphan (PROMETHAZINE-DM) 6.25-15 MG/5ML syrup   pseudoephedrine-guaifenesin (MUCINEX D) 60-600 MG 12 hr tablet   predniSONE (DELTASONE) 10 MG tablet   Hyperthyroidism - Primary    Hx of hyperthyroidism. Repeat labs today.      Relevant Orders   TSH (Completed)   Recurrent headache    Recent ER visit within the normal range.  No clear etiology at this point.  Seem to have improved with prednisone. Repeat prednisone  course but lower and slow  taper. Possible migraine variant.  Can try imitrex as needed if severe  MIGRAINE LIKE headache returns. Keep scheduled appointment with ENT.        Kerby Nora, MD

## 2022-02-03 NOTE — Patient Instructions (Addendum)
Quit smoking!  Keep appt with ENT in [place].  Please stop at the lab to have labs drawn. Do not use more than 800 mg ibuprofen three times daily.  Repeat prednisone  course but lower and slow taper.    Can try imitrex as needed if severe  MIGRAINE LIKE headache returns.

## 2022-02-13 ENCOUNTER — Other Ambulatory Visit: Payer: Self-pay | Admitting: Family Medicine

## 2022-02-13 DIAGNOSIS — J432 Centrilobular emphysema: Secondary | ICD-10-CM

## 2022-02-13 NOTE — Telephone Encounter (Signed)
E-scribed refills.  Needs CPE and lab visits.

## 2022-02-14 NOTE — Telephone Encounter (Signed)
Patient has been scheduled

## 2022-02-20 ENCOUNTER — Other Ambulatory Visit: Payer: Self-pay | Admitting: Family Medicine

## 2022-02-20 MED ORDER — ALBUTEROL SULFATE HFA 108 (90 BASE) MCG/ACT IN AERS: 1.0000 | INHALATION_SPRAY | Freq: Four times a day (QID) | RESPIRATORY_TRACT | 2 refills | Status: DC | PRN

## 2022-02-20 MED ORDER — IPRATROPIUM BROMIDE 0.03 % NA SOLN: 2.0000 | Freq: Two times a day (BID) | NASAL | 2 refills | Status: DC

## 2022-02-25 ENCOUNTER — Encounter: Payer: Self-pay | Admitting: Family Medicine

## 2022-02-25 DIAGNOSIS — R519 Headache, unspecified: Secondary | ICD-10-CM | POA: Insufficient documentation

## 2022-02-26 ENCOUNTER — Ambulatory Visit: Payer: 59 | Admitting: Physician Assistant

## 2022-02-28 ENCOUNTER — Ambulatory Visit: Payer: 59 | Admitting: Adult Health

## 2022-03-08 NOTE — Assessment & Plan Note (Signed)
Recent ER visit within the normal range.  No clear etiology at this point.  Seem to have improved with prednisone. Repeat prednisone  course but lower and slow taper. Possible migraine variant.  Can try imitrex as needed if severe  MIGRAINE LIKE headache returns. Keep scheduled appointment with ENT.

## 2022-03-08 NOTE — Assessment & Plan Note (Signed)
Hx of hyperthyroidism. Repeat labs today.

## 2022-03-08 NOTE — Assessment & Plan Note (Signed)
Quit smoking. 

## 2022-03-17 ENCOUNTER — Encounter: Payer: Self-pay | Admitting: Family Medicine

## 2022-03-18 ENCOUNTER — Encounter: Payer: Self-pay | Admitting: Family Medicine

## 2022-03-18 ENCOUNTER — Telehealth (INDEPENDENT_AMBULATORY_CARE_PROVIDER_SITE_OTHER): Payer: 59 | Admitting: Family Medicine

## 2022-03-18 VITALS — BP 110/75 | HR 94 | Temp 98.8°F | Ht 68.0 in | Wt 160.0 lb

## 2022-03-18 DIAGNOSIS — F17200 Nicotine dependence, unspecified, uncomplicated: Secondary | ICD-10-CM

## 2022-03-18 DIAGNOSIS — E118 Type 2 diabetes mellitus with unspecified complications: Secondary | ICD-10-CM | POA: Diagnosis not present

## 2022-03-18 DIAGNOSIS — U071 COVID-19: Secondary | ICD-10-CM | POA: Diagnosis not present

## 2022-03-18 DIAGNOSIS — J432 Centrilobular emphysema: Secondary | ICD-10-CM

## 2022-03-18 HISTORY — DX: COVID-19: U07.1

## 2022-03-18 MED ORDER — NIRMATRELVIR/RITONAVIR (PAXLOVID)TABLET: 3.0000 | ORAL_TABLET | Freq: Two times a day (BID) | ORAL | 0 refills | Status: DC

## 2022-03-18 MED ORDER — NIRMATRELVIR/RITONAVIR (PAXLOVID)TABLET: 3.0000 | ORAL_TABLET | Freq: Two times a day (BID) | ORAL | 0 refills | Status: AC

## 2022-03-18 NOTE — Progress Notes (Signed)
Patient ID: Makayla Green, female    DOB: 08-27-1970, 51 y.o.   MRN: [id number]  Virtual visit completed through [place], a video enabled telemedicine application. Due to national recommendations of social distancing due to COVID-19, a virtual visit is felt to be most appropriate for this patient at this time. Reviewed limitations, risks, security and privacy concerns of performing a virtual visit and the availability of in person appointments. I also reviewed that there may be a patient responsible charge related to this service. The patient agreed to proceed.   Patient location: home Provider location: [place] at [facility], office Persons participating in this virtual visit: patient, provider   If any vitals were documented, they were collected by patient at home unless specified below.    BP 110/75   Pulse 94   Temp 98.8 F (37.1 C)   Ht 5' 8"  (1.727 m)   Wt 160 lb (72.6 kg)   BMI 24.33 kg/m    CC: COVID positive Subjective:   HPI: Makayla Green is a 51 y.o. female presenting on 03/18/2022 for Covid Positive (C/o sinus congestion, HA, chills and wheezing.  Sxs started 03/17/22.  Pos home COVID test- 03/17/22. )   First day of symptoms: 03/17/2022 Tested COVID positive: 03/17/2022  Current symptoms: headache, chills, sinus congestion and wheezing, mild cough.  No: fever, ST, PNdrainage, abd pain, nausea, diarrhea, loss of taste or smell.  Treatments to date: tylenol, motrin. She's also taken phenergan cough syrup.  Risk factors include: smoker, COPD, CAD (nonobstructive), diabetes FIL sick with COVID as well.   Regularly takes symbicort and spiriva with allegra and flonase. Has recently taken some albuterol as well.   Smoking - 1/2 ppd.   Seeing allergist, just had allergy testing with many positive results. Planning to start allergy shots 3 times a week.   COVID vaccination status: did not receive       Relevant past medical, surgical, family and social  history reviewed and updated as indicated. Interim medical history since our last visit reviewed. Allergies and medications reviewed and updated. Outpatient Medications Prior to Visit  Medication Sig Dispense Refill   albuterol (VENTOLIN HFA) 108 (90 Base) MCG/ACT inhaler Inhale 1-2 puffs into the lungs every 6 (six) hours as needed for wheezing or shortness of breath. Every 6 hours as needed 8 g 2   aspirin EC 81 MG tablet Take 1 tablet (81 mg total) by mouth daily. 90 tablet 3   budesonide (PULMICORT) 0.5 MG/2ML nebulizer solution Take 2 mLs (0.5 mg total) by nebulization 2 (two) times daily as needed (during asthma flares/URIs for 1-2 weeks.). 60 mL 1   budesonide-formoterol (SYMBICORT) 160-4.5 MCG/ACT inhaler INHALE 2 PUFFS INTO THE LUNGS IN THE MORNING AND AT BEDTIME DAILY. 10.2 g 2   fexofenadine (ALLEGRA ALLERGY) 180 MG tablet Take 1 tablet (180 mg total) by mouth daily as needed for allergies or rhinitis.     fluticasone (FLONASE) 50 MCG/ACT nasal spray SPRAY 2 SPRAYS INTO EACH NOSTRIL EVERY DAY 16 mL 3   ipratropium (ATROVENT) 0.03 % nasal spray Place 2 sprays into both nostrils every 12 (twelve) hours. 30 mL 2   isosorbide mononitrate (ISMO) 20 MG tablet Take 1 tablet (20 mg total) by mouth 2 (two) times daily at 10 AM and 5 PM. 60 tablet 1   promethazine-dextromethorphan (PROMETHAZINE-DM) 6.25-15 MG/5ML syrup Take 5 mLs by mouth every 4 (four) hours as needed.     pseudoephedrine-guaifenesin (MUCINEX D) 60-600 MG 12 hr  tablet Take 1 tablet by mouth every 12 (twelve) hours.     rosuvastatin (CRESTOR) 5 MG tablet TAKE 1 TABLET BY MOUTH DAILY AT 6PM NEEDS APPT FOR REFILLS 30 tablet 11   SPIRIVA RESPIMAT 2.5 MCG/ACT AERS INHALE TWO PUFFS BY MOUTH INTO THE LUNGSDAILY. 4 g 2   predniSONE (DELTASONE) 10 MG tablet 1 tablet daily x 5 days, then 1/2 tablet daily x 5 days 8 tablet 0   SUMAtriptan (IMITREX) 100 MG tablet Take 1 tablet (100 mg total) by mouth once for 1 dose. May repeat in 2 hours if  headache persists or recurs. 10 tablet 0   No facility-administered medications prior to visit.     Per HPI unless specifically indicated in ROS section below Review of Systems Objective:  BP 110/75   Pulse 94   Temp 98.8 F (37.1 C)   Ht 5' 8"  (1.727 m)   Wt 160 lb (72.6 kg)   BMI 24.33 kg/m   Wt Readings from Last 3 Encounters:  03/18/22 160 lb (72.6 kg)  02/03/22 161 lb 8 oz (73.3 kg)  01/21/22 155 lb (70.3 kg)       Physical exam: Gen: alert, NAD, not ill appearing Pulm: speaks in complete sentences without increased work of breathing Psych: normal mood, normal thought content      Results for orders placed or performed in visit on 02/03/22  TSH  Result Value Ref Range   TSH 0.81 0.35 - 5.50 uIU/mL   Lab Results  Component Value Date   CREATININE 0.69 11/29/2020   BUN 14 11/29/2020   NA 141 11/29/2020   K 4.9 11/29/2020   CL 105 11/29/2020   CO2 28 11/29/2020   eGFR >60   Assessment & Plan:   Problem List Items Addressed This Visit     TOBACCO ABUSE   Centrilobular emphysema ([place])   Controlled diabetes mellitus type 2 with complications ([place])   TIWPY-09 virus infection - Primary    Reviewed currently approved antiviral treatments.  Reviewed expected course of illness, anticipated course of recovery, as well as red flags to suggest COVID pneumonia and/or to seek urgent in-person care. Reviewed latest CDC isolation/quarantine guidelines.  Encouraged fluids and rest. Reviewed further supportive care measures at home including vit C 536m bid, vit D 2000 IU daily, zinc 1093mdaily, tylenol PRN, pepcid 2040mID PRN.   Recommend:  Full dose paxlovid. Paxlovid drug interactions:  Crestor - advised hold statin while on paxlovid. Inhaled and intranasal steroids - mild      Relevant Medications   nirmatrelvir/ritonavir EUA (PAXLOVID) 20 x 150 MG & 10 x 100MG TABS     Meds ordered this encounter  Medications   DISCONTD: nirmatrelvir/ritonavir EUA  (PAXLOVID) 20 x 150 MG & 10 x 100MG TABS    Sig: Take 3 tablets by mouth 2 (two) times daily for 5 days. (Take nirmatrelvir 150 mg two tablets twice daily for 5 days and ritonavir 100 mg one tablet twice daily for 5 days) Patient GFR is >60    Dispense:  30 tablet    Refill:  0   nirmatrelvir/ritonavir EUA (PAXLOVID) 20 x 150 MG & 10 x 100MG TABS    Sig: Take 3 tablets by mouth 2 (two) times daily for 5 days. (Take nirmatrelvir 150 mg two tablets twice daily for 5 days and ritonavir 100 mg one tablet twice daily for 5 days) Patient GFR is >60    Dispense:  30 tablet    Refill:  0   No orders of the defined types were placed in this encounter.   I discussed the assessment and treatment plan with the patient. The patient was provided an opportunity to ask questions and all were answered. The patient agreed with the plan and demonstrated an understanding of the instructions. The patient was advised to call back or seek an in-person evaluation if the symptoms worsen or if the condition fails to improve as anticipated.  Follow up plan: No follow-ups on file.  Ria Bush, MD

## 2022-03-18 NOTE — Telephone Encounter (Signed)
Spoke with pt scheduling MyChart visit at 9:00 today.

## 2022-03-18 NOTE — Assessment & Plan Note (Addendum)
Reviewed currently approved antiviral treatments.  Reviewed expected course of illness, anticipated course of recovery, as well as red flags to suggest COVID pneumonia and/or to seek urgent in-person care. Reviewed latest CDC isolation/quarantine guidelines.  Encouraged fluids and rest. Reviewed further supportive care measures at home including vit C 500mg  bid, vit D 2000 IU daily, zinc 100mg  daily, tylenol PRN, pepcid 20mg  BID PRN.   Recommend:  Full dose paxlovid. Paxlovid drug interactions:  Crestor - advised hold statin while on paxlovid. Inhaled and intranasal steroids - mild

## 2022-03-20 ENCOUNTER — Encounter: Payer: Self-pay | Admitting: Family Medicine

## 2022-03-20 MED ORDER — PREDNISONE 20 MG PO TABS: 40.0000 mg | ORAL_TABLET | Freq: Every day | ORAL | 0 refills | Status: DC

## 2022-03-20 NOTE — Telephone Encounter (Signed)
Prednisone course sent to [facility] ([place] pharmacy

## 2022-03-20 NOTE — Telephone Encounter (Signed)
Patient called and asked if there is something can help her breath easy. Call back number [phone].

## 2022-03-21 ENCOUNTER — Telehealth: Payer: 59 | Admitting: Family Medicine

## 2022-03-21 DIAGNOSIS — F419 Anxiety disorder, unspecified: Secondary | ICD-10-CM

## 2022-03-21 MED ORDER — HYDROXYZINE PAMOATE 25 MG PO CAPS: 25.0000 mg | ORAL_CAPSULE | Freq: Three times a day (TID) | ORAL | 0 refills | Status: AC | PRN

## 2022-03-21 NOTE — Progress Notes (Signed)
Virtual Visit Consent   Makayla Green, you are scheduled for a virtual visit with a [facility] provider today. Just as with appointments in the office, your consent must be obtained to participate. Your consent will be active for this visit and any virtual visit you may have with one of our providers in the next 365 days. If you have a MyChart account, a copy of this consent can be sent to you electronically.  As this is a virtual visit, video technology does not allow for your provider to perform a traditional examination. This may limit your provider's ability to fully assess your condition. If your provider identifies any concerns that need to be evaluated in person or the need to arrange testing (such as labs, EKG, etc.), we will make arrangements to do so. Although advances in technology are sophisticated, we cannot ensure that it will always work on either your end or our end. If the connection with a video visit is poor, the visit may have to be switched to a telephone visit. With either a video or telephone visit, we are not always able to ensure that we have a secure connection.  By engaging in this virtual visit, you consent to the provision of healthcare and authorize for your insurance to be billed (if applicable) for the services provided during this visit. Depending on your insurance coverage, you may receive a charge related to this service.  I need to obtain your verbal consent now. Are you willing to proceed with your visit today? Makayla Green has provided verbal consent on 03/21/2022 for a virtual visit (video or telephone). Georgana Curio, FNP  Date: 03/21/2022 5:34 PM  Virtual Visit via Video Note   I, Georgana Curio, connected with  Makayla Green  ([id number], 01/31/1971) on 03/21/22 at  5:30 PM EDT by a video-enabled telemedicine application and verified that I am speaking with the correct person using two identifiers.  Location: Patient: Virtual Visit Location  Patient: Home Provider: Virtual Visit Location Provider: Home Office   I discussed the limitations of evaluation and management by telemedicine and the availability of in person appointments. The patient expressed understanding and agreed to proceed.    History of Present Illness: Makayla Green is a 51 y.o. who identifies as a female who was assigned female at birth, and is being seen today for covid. She was put on paxlovid Tuesday. Will finish tomorrow. History of asthma and copD. Started prednisone 40 mg yesterday daily. Anxious since yesterday, Panic attacks. Pulse ox 95%.   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 06/01/2020   Drug reaction 05/29/2020   Acute bronchitis 05/22/2020   Tachycardia 10/29/2019   Other allergic rhinitis 11/21/2016   Coronary artery disease involving native coronary artery of native heart    Coronary arteriosclerosis in native artery 10/23/2015   Thyroid nodule 10/23/2015   Centrilobular emphysema (HCC) 07/06/2014   Anxiety and depression 05/11/2014   Hyperthyroidism 04/20/2014   TOBACCO ABUSE 10/11/2010    Allergies:  Allergies  Allergen Reactions   Penicillins Shortness Of Breath and Swelling    Has  patient had a PCN reaction causing immediate rash, facial/tongue/throat swelling, SOB or lightheadedness with hypotension: Yes Has patient had a PCN reaction causing severe rash involving mucus membranes or skin necrosis: No Has patient had a PCN reaction that required hospitalization No Has patient had a PCN reaction occurring  within the last 10 years: No If all of the above answers are "NO", then may proceed with Cephalosporin use.    Amitriptyline     Hallucinations   Methylprednisolone Acetate Other (See Comments)    Positive reaction to skin prick/intradermal testing by allergist   Shellfish Allergy     Patient has not eaten any shellfish and does not know if she is allergic. She has had radiology images using dye with no reactions noted.    Medications:  Current Outpatient Medications:    hydrOXYzine (VISTARIL) 25 MG capsule, Take 1 capsule (25 mg total) by mouth every 8 (eight) hours as needed for up to 5 days., Disp: 15 capsule, Rfl: 0   albuterol (VENTOLIN HFA) 108 (90 Base) MCG/ACT inhaler, Inhale 1-2 puffs into the lungs every 6 (six) hours as needed for wheezing or shortness of breath. Every 6 hours as needed, Disp: 8 g, Rfl: 2   aspirin EC 81 MG tablet, Take 1 tablet (81 mg total) by mouth daily., Disp: 90 tablet, Rfl: 3   budesonide (PULMICORT) 0.5 MG/2ML nebulizer solution, Take 2 mLs (0.5 mg total) by nebulization 2 (two) times daily as needed (during asthma flares/URIs for 1-2 weeks.)., Disp: 60 mL, Rfl: 1   budesonide-formoterol (SYMBICORT) 160-4.5 MCG/ACT inhaler, INHALE 2 PUFFS INTO THE LUNGS IN THE MORNING AND AT BEDTIME DAILY., Disp: 10.2 g, Rfl: 2   fexofenadine (ALLEGRA ALLERGY) 180 MG tablet, Take 1 tablet (180 mg total) by mouth daily as needed for allergies or rhinitis., Disp: , Rfl:    fluticasone (FLONASE) 50 MCG/ACT nasal spray, SPRAY 2 SPRAYS INTO EACH NOSTRIL EVERY DAY, Disp: 16 mL, Rfl: 3   ipratropium (ATROVENT) 0.03 % nasal spray, Place 2 sprays into both nostrils every 12 (twelve) hours., Disp: 30 mL, Rfl: 2   isosorbide mononitrate (ISMO) 20 MG tablet, Take 1 tablet (20 mg total) by mouth 2 (two) times daily at 10 AM and 5 PM., Disp: 60 tablet, Rfl: 1   nirmatrelvir/ritonavir EUA (PAXLOVID) 20 x 150 MG & 10 x 100MG  TABS, Take 3 tablets by mouth 2 (two) times daily for 5 days. (Take  nirmatrelvir 150 mg two tablets twice daily for 5 days and ritonavir 100 mg one tablet twice daily for 5 days) Patient GFR is >60, Disp: 30 tablet, Rfl: 0   predniSONE (DELTASONE) 20 MG tablet, Take 2 tablets (40 mg total) by mouth daily with breakfast., Disp: 10 tablet, Rfl: 0   promethazine-dextromethorphan (PROMETHAZINE-DM) 6.25-15 MG/5ML syrup, Take 5 mLs by mouth every 4 (four) hours as needed., Disp: , Rfl:    pseudoephedrine-guaifenesin (MUCINEX D) 60-600 MG 12 hr tablet, Take 1 tablet by mouth every 12 (twelve) hours., Disp: , Rfl:    rosuvastatin (CRESTOR) 5 MG tablet, TAKE 1 TABLET BY MOUTH DAILY AT 6PM NEEDS APPT FOR REFILLS, Disp: 30 tablet, Rfl: 11   SPIRIVA RESPIMAT 2.5 MCG/ACT AERS, INHALE TWO PUFFS BY MOUTH INTO THE LUNGSDAILY., Disp: 4 g, Rfl: 2   SUMAtriptan (IMITREX) 100 MG tablet, Take 1 tablet (100 mg total) by mouth once for 1 dose. May repeat in 2 hours if headache persists or recurs., Disp: 10 tablet, Rfl: 0  Observations/Objective: Patient is well-developed, well-nourished in  no acute distress.  Resting comfortably  at home.  Head is normocephalic, atraumatic.  No labored breathing.  Speech is clear and coherent with logical content.  Patient is alert and oriented at baseline.    Assessment and Plan: 1. Anxiety  Increase fluids, stop paxlovid and prednisone, urgent care if sx worsen.  Follow Up Instructions: I discussed the assessment and treatment plan with the patient. The patient was provided an opportunity to ask questions and all were answered. The patient agreed with the plan and demonstrated an understanding of the instructions.  A copy of instructions were sent to the patient via MyChart unless otherwise noted below.     The patient was advised to call back or seek an in-person evaluation if the symptoms worsen or if the condition fails to improve as anticipated.  Time:  I spent 10 minutes with the patient via telehealth technology discussing the above  problems/concerns.    Georgana Curio, FNP

## 2022-03-22 ENCOUNTER — Telehealth: Payer: 59 | Admitting: Nurse Practitioner

## 2022-03-22 DIAGNOSIS — U071 COVID-19: Secondary | ICD-10-CM

## 2022-03-22 NOTE — Patient Instructions (Signed)

## 2022-03-22 NOTE — Progress Notes (Signed)
Virtual Visit Consent   Makayla Green, you are scheduled for a virtual visit with Mary-Margaret Daphine Deutscher, FNP, a [facility] provider, today.     Just as with appointments in the office, your consent must be obtained to participate.  Your consent will be active for this visit and any virtual visit you may have with one of our providers in the next 365 days.     If you have a MyChart account, a copy of this consent can be sent to you electronically.  All virtual visits are billed to your insurance company just like a traditional visit in the office.    As this is a virtual visit, video technology does not allow for your provider to perform a traditional examination.  This may limit your provider's ability to fully assess your condition.  If your provider identifies any concerns that need to be evaluated in person or the need to arrange testing (such as labs, EKG, etc.), we will make arrangements to do so.     Although advances in technology are sophisticated, we cannot ensure that it will always work on either your end or our end.  If the connection with a video visit is poor, the visit may have to be switched to a telephone visit.  With either a video or telephone visit, we are not always able to ensure that we have a secure connection.     I need to obtain your verbal consent now.   Are you willing to proceed with your visit today? YES   Makayla Green has provided verbal consent on 03/22/2022 for a virtual visit (video or telephone).   Mary-Margaret Daphine Deutscher, FNP   Date: 03/22/2022 4:11 PM   Virtual Visit via Video Note   I, Mary-Margaret Daphine Deutscher, connected with Makayla Green ([id number], 1971-07-24) on 03/22/22 at  4:15 PM EDT by a video-enabled telemedicine application and verified that I am speaking with the correct person using two identifiers.  Location: Patient: Virtual Visit Location Patient: Home Provider: Virtual Visit Location Provider: Mobile   I discussed  the limitations of evaluation and management by telemedicine and the availability of in person appointments. The patient expressed understanding and agreed to proceed.    History of Present Illness: Makayla Green is a 51 y.o. who identifies as a female who was assigned female at birth, and is being seen today for covid positive.  HPI: Patient tested positive for covid on Monday. She saw her PCP on Tuesday and she was started on paxlovid. On Wednesday prednisone was added 40mg  daily. Thursday afternoon she spoke to her PCP and was having a panic attack, jittery. She was told to stop paxlovid. She is has not taken the prednisone, because was afraid it would make her jittery. BUt now she is sob and o2 sats have dropped  to the 90-95. She has vistaril to take if she gets jittery.    Review of Systems  Respiratory:  Positive for shortness of breath.   Psychiatric/Behavioral:  The patient is nervous/anxious.   All other systems reviewed and are negative.   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 06/01/2020   Drug reaction 05/29/2020   Acute bronchitis 05/22/2020   Tachycardia 10/29/2019   Other allergic rhinitis 11/21/2016   Coronary artery disease involving native coronary artery of native heart    Coronary arteriosclerosis in native artery 10/23/2015   Thyroid nodule 10/23/2015   Centrilobular emphysema (HCC) 07/06/2014   Anxiety and depression 05/11/2014   Hyperthyroidism 04/20/2014   TOBACCO ABUSE 10/11/2010    Allergies:  Allergies  Allergen Reactions   Penicillins  Shortness Of Breath and Swelling    Has patient had a PCN reaction causing immediate rash, facial/tongue/throat swelling, SOB or lightheadedness with hypotension: Yes Has patient had a PCN reaction causing severe rash involving mucus membranes or skin necrosis: No Has patient had a PCN reaction that required hospitalization No Has patient had a PCN reaction occurring within the last 10 years: No If all of the above answers are "NO", then may proceed with Cephalosporin use.    Amitriptyline     Hallucinations   Methylprednisolone Acetate Other (See Comments)    Positive reaction to skin prick/intradermal testing by allergist   Shellfish Allergy     Patient has not eaten any shellfish and does not know if she is allergic. She has had radiology images using dye with no reactions noted.    Medications:  Current Outpatient Medications:    albuterol (VENTOLIN HFA) 108 (90 Base) MCG/ACT inhaler, Inhale 1-2 puffs into the lungs every 6 (six) hours as needed for wheezing or shortness of breath. Every 6 hours as needed, Disp: 8 g, Rfl: 2   aspirin EC 81 MG tablet, Take 1 tablet (81 mg total) by mouth daily., Disp: 90 tablet, Rfl: 3   budesonide (PULMICORT) 0.5 MG/2ML nebulizer solution, Take 2 mLs (0.5 mg total) by nebulization 2 (two) times daily as needed (during asthma flares/URIs for 1-2 weeks.)., Disp: 60 mL, Rfl: 1   budesonide-formoterol (SYMBICORT) 160-4.5 MCG/ACT inhaler, INHALE 2 PUFFS INTO THE LUNGS IN THE MORNING AND AT BEDTIME DAILY., Disp: 10.2 g, Rfl: 2   fexofenadine (ALLEGRA ALLERGY) 180 MG tablet, Take 1 tablet (180 mg total) by mouth daily as needed for allergies or rhinitis., Disp: , Rfl:    fluticasone (FLONASE) 50 MCG/ACT nasal spray, SPRAY 2 SPRAYS INTO EACH NOSTRIL EVERY DAY, Disp: 16 mL, Rfl: 3   hydrOXYzine (VISTARIL) 25 MG capsule, Take 1 capsule (25 mg total) by mouth every 8 (eight) hours as needed for up to 5 days., Disp: 15 capsule, Rfl: 0   ipratropium (ATROVENT) 0.03 %  nasal spray, Place 2 sprays into both nostrils every 12 (twelve) hours., Disp: 30 mL, Rfl: 2   isosorbide mononitrate (ISMO) 20 MG tablet, Take 1 tablet (20 mg total) by mouth 2 (two) times daily at 10 AM and 5 PM., Disp: 60 tablet, Rfl: 1   nirmatrelvir/ritonavir EUA (PAXLOVID) 20 x 150 MG & 10 x 100MG  TABS, Take 3 tablets by mouth 2 (two) times daily for 5 days. (Take nirmatrelvir 150 mg two tablets twice daily for 5 days and ritonavir 100 mg one tablet twice daily for 5 days) Patient GFR is >60, Disp: 30 tablet, Rfl: 0   predniSONE (DELTASONE) 20 MG tablet, Take 2 tablets (40 mg total) by mouth daily with breakfast., Disp: 10 tablet, Rfl: 0   promethazine-dextromethorphan (PROMETHAZINE-DM) 6.25-15 MG/5ML syrup, Take 5 mLs by mouth every 4 (four) hours as needed., Disp: , Rfl:    pseudoephedrine-guaifenesin (MUCINEX D) 60-600 MG 12 hr tablet, Take 1 tablet by mouth every  12 (twelve) hours., Disp: , Rfl:    rosuvastatin (CRESTOR) 5 MG tablet, TAKE 1 TABLET BY MOUTH DAILY AT 6PM NEEDS APPT FOR REFILLS, Disp: 30 tablet, Rfl: 11   SPIRIVA RESPIMAT 2.5 MCG/ACT AERS, INHALE TWO PUFFS BY MOUTH INTO THE LUNGSDAILY., Disp: 4 g, Rfl: 2   SUMAtriptan (IMITREX) 100 MG tablet, Take 1 tablet (100 mg total) by mouth once for 1 dose. May repeat in 2 hours if headache persists or recurs., Disp: 10 tablet, Rfl: 0  Observations/Objective: Patient is well-developed, well-nourished in no acute distress.  Resting comfortably  at home.  Head is normocephalic, atraumatic.  No labored breathing.  Speech is clear and coherent with logical content.  Patient is alert and oriented at baseline.  Seems a little shaky  Assessment and Plan:  Makayla Green in today with chief complaint of Covid Positive   1. Positive self-administered antigen test for COVID-19 Go ahead and take prednisone40mg - if causes jitterness then can take a vistaril.  If O@ sat drops and will not go back up above 90% - will have to go to the  ED. Rest Force fluids Stress management   Follow Up Instructions: I discussed the assessment and treatment plan with the patient. The patient was provided an opportunity to ask questions and all were answered. The patient agreed with the plan and demonstrated an understanding of the instructions.  A copy of instructions were sent to the patient via MyChart.  The patient was advised to call back or seek an in-person evaluation if the symptoms worsen or if the condition fails to improve as anticipated.  Time:  I spent 8 minutes with the patient via telehealth technology discussing the above problems/concerns.    Mary-Margaret Daphine Deutscher, FNP

## 2022-03-24 ENCOUNTER — Other Ambulatory Visit: Payer: Self-pay | Admitting: Family Medicine

## 2022-03-24 ENCOUNTER — Encounter: Payer: Self-pay | Admitting: Family Medicine

## 2022-03-24 MED ORDER — PREDNISONE 10 MG PO TABS: ORAL_TABLET | ORAL | 0 refills | Status: AC

## 2022-03-24 NOTE — Telephone Encounter (Signed)
Spoke with patient - continued productive coughing, exertional dyspnea. O2 levels staying >93%. No fever, wheezing.  Yesterday lost power - AC out all day. This contributes to respiratory symptoms.  Took last dose of steroid 40mg  this morning. Requests prednisone taper as it is helping symptoms. Continues albuterol inh.  Reviewed reasons to seek in person evaluation.  Did not tolerate paxlovid - feels it caused panic attack.

## 2022-03-24 NOTE — Telephone Encounter (Signed)
Spoke with patient today by phone.

## 2022-03-31 ENCOUNTER — Encounter: Payer: Self-pay | Admitting: Cardiovascular Disease

## 2022-03-31 MED ORDER — ISOSORBIDE MONONITRATE ER 30 MG PO TB24: 30.0000 mg | ORAL_TABLET | Freq: Every day | ORAL | 1 refills | Status: DC

## 2022-03-31 NOTE — Telephone Encounter (Signed)
Please change ismo to imdur 30 mg daily

## 2022-05-13 ENCOUNTER — Other Ambulatory Visit: Payer: Self-pay | Admitting: Family Medicine

## 2022-05-13 DIAGNOSIS — E0590 Thyrotoxicosis, unspecified without thyrotoxic crisis or storm: Secondary | ICD-10-CM

## 2022-05-13 DIAGNOSIS — E118 Type 2 diabetes mellitus with unspecified complications: Secondary | ICD-10-CM

## 2022-05-14 ENCOUNTER — Other Ambulatory Visit: Payer: Self-pay | Admitting: Family Medicine

## 2022-05-14 DIAGNOSIS — J432 Centrilobular emphysema: Secondary | ICD-10-CM

## 2022-05-14 NOTE — Telephone Encounter (Signed)
Refill Symbicort Last office visit with PCP 12/04/20 Upcoming appointment 05/30/22 See allergy/contraindication Last refill 02/13/22 10.2 G/2 refills

## 2022-05-16 ENCOUNTER — Other Ambulatory Visit: Payer: 59

## 2022-05-23 ENCOUNTER — Telehealth: Payer: 59 | Admitting: Family Medicine

## 2022-05-23 DIAGNOSIS — J4521 Mild intermittent asthma with (acute) exacerbation: Secondary | ICD-10-CM

## 2022-05-23 MED ORDER — AZITHROMYCIN 250 MG PO TABS: ORAL_TABLET | ORAL | 0 refills | Status: AC

## 2022-05-23 MED ORDER — PREDNISONE 20 MG PO TABS: 20.0000 mg | ORAL_TABLET | Freq: Two times a day (BID) | ORAL | 0 refills | Status: AC

## 2022-05-23 NOTE — Progress Notes (Signed)
We are sorry that you are not feeling well.  Here is how we plan to help!  Based on your presentation I believe you most likely have A cough due to bacteria.  When patients have a fever and a productive cough with a change in color or increased sputum production, we are concerned about bacterial bronchitis.  If left untreated it can progress to pneumonia.  If your symptoms do not improve with your treatment plan it is important that you contact your provider.   I have prescribed Azithromyin 250 mg: two tablets now and then one tablet daily for 4 additonal days    In addition you may use   Prednisone 40 mg daily for 5 days.   From your responses in the eVisit questionnaire you describe inflammation in the upper respiratory tract which is causing a significant cough.  This is commonly called Bronchitis and has four common causes:   Allergies Viral Infections Acid Reflux Bacterial Infection Allergies, viruses and acid reflux are treated by controlling symptoms or eliminating the cause. An example might be a cough caused by taking certain blood pressure medications. You stop the cough by changing the medication. Another example might be a cough caused by acid reflux. Controlling the reflux helps control the cough.  USE OF BRONCHODILATOR ("RESCUE") INHALERS: There is a risk from using your bronchodilator too frequently.  The risk is that over-reliance on a medication which only relaxes the muscles surrounding the breathing tubes can reduce the effectiveness of medications prescribed to reduce swelling and congestion of the tubes themselves.  Although you feel brief relief from the bronchodilator inhaler, your asthma may actually be worsening with the tubes becoming more swollen and filled with mucus.  This can delay other crucial treatments, such as oral steroid medications. If you need to use a bronchodilator inhaler daily, several times per day, you should discuss this with your provider.  There are  probably better treatments that could be used to keep your asthma under control.     HOME CARE Only take medications as instructed by your medical team. Complete the entire course of an antibiotic. Drink plenty of fluids and get plenty of rest. Avoid close contacts especially the very young and the elderly Cover your mouth if you cough or cough into your sleeve. Always remember to wash your hands A steam or ultrasonic humidifier can help congestion.   GET HELP RIGHT AWAY IF: You develop worsening fever. You become short of breath You cough up blood. Your symptoms persist after you have completed your treatment plan MAKE SURE YOU  Understand these instructions. Will watch your condition. Will get help right away if you are not doing well or get worse.    Thank you for choosing an e-visit.  Your e-visit answers were reviewed by a board certified advanced clinical practitioner to complete your personal care plan. Depending upon the condition, your plan could have included both over the counter or prescription medications.  Please review your pharmacy choice. Make sure the pharmacy is open so you can pick up prescription now. If there is a problem, you may contact your provider through [employer] and have the prescription routed to another pharmacy.  Your safety is important to [place]. If you have drug allergies check your prescription carefully.   For the next 24 hours you can use MyChart to ask questions about today's visit, request a non-urgent call back, or ask for a work or school excuse. You will get an email in  the next two days asking about your experience. I hope that your e-visit has been valuable and will speed your recovery.

## 2022-05-26 ENCOUNTER — Other Ambulatory Visit: Payer: 59

## 2022-05-30 ENCOUNTER — Encounter: Payer: 59 | Admitting: Family Medicine

## 2022-06-13 ENCOUNTER — Other Ambulatory Visit: Payer: Self-pay | Admitting: Family Medicine

## 2022-06-13 DIAGNOSIS — J432 Centrilobular emphysema: Secondary | ICD-10-CM

## 2022-06-25 ENCOUNTER — Encounter: Payer: Self-pay | Admitting: Family Medicine

## 2022-06-25 ENCOUNTER — Other Ambulatory Visit: Payer: Self-pay | Admitting: Family Medicine

## 2022-06-25 ENCOUNTER — Ambulatory Visit: Payer: 59

## 2022-06-25 DIAGNOSIS — J432 Centrilobular emphysema: Secondary | ICD-10-CM

## 2022-07-11 ENCOUNTER — Other Ambulatory Visit: Payer: Self-pay | Admitting: Family Medicine

## 2022-07-11 DIAGNOSIS — J432 Centrilobular emphysema: Secondary | ICD-10-CM

## 2022-07-26 ENCOUNTER — Other Ambulatory Visit: Payer: Self-pay | Admitting: Family Medicine

## 2022-07-26 DIAGNOSIS — J432 Centrilobular emphysema: Secondary | ICD-10-CM

## 2022-08-01 ENCOUNTER — Other Ambulatory Visit: Payer: 59

## 2022-08-08 ENCOUNTER — Ambulatory Visit (INDEPENDENT_AMBULATORY_CARE_PROVIDER_SITE_OTHER): Payer: 59 | Admitting: Family Medicine

## 2022-08-08 ENCOUNTER — Encounter: Payer: Self-pay | Admitting: Family Medicine

## 2022-08-08 VITALS — BP 126/80 | HR 95 | Temp 97.2°F | Ht 66.5 in | Wt 154.2 lb

## 2022-08-08 DIAGNOSIS — Z23 Encounter for immunization: Secondary | ICD-10-CM

## 2022-08-08 DIAGNOSIS — J302 Other seasonal allergic rhinitis: Secondary | ICD-10-CM

## 2022-08-08 DIAGNOSIS — Z01419 Encounter for gynecological examination (general) (routine) without abnormal findings: Secondary | ICD-10-CM

## 2022-08-08 DIAGNOSIS — Z1211 Encounter for screening for malignant neoplasm of colon: Secondary | ICD-10-CM | POA: Diagnosis not present

## 2022-08-08 DIAGNOSIS — E118 Type 2 diabetes mellitus with unspecified complications: Secondary | ICD-10-CM | POA: Diagnosis not present

## 2022-08-08 DIAGNOSIS — I25111 Atherosclerotic heart disease of native coronary artery with angina pectoris with documented spasm: Secondary | ICD-10-CM

## 2022-08-08 DIAGNOSIS — Z1231 Encounter for screening mammogram for malignant neoplasm of breast: Secondary | ICD-10-CM

## 2022-08-08 DIAGNOSIS — J4550 Severe persistent asthma, uncomplicated: Secondary | ICD-10-CM

## 2022-08-08 DIAGNOSIS — Z0000 Encounter for general adult medical examination without abnormal findings: Secondary | ICD-10-CM

## 2022-08-08 DIAGNOSIS — E0590 Thyrotoxicosis, unspecified without thyrotoxic crisis or storm: Secondary | ICD-10-CM | POA: Diagnosis not present

## 2022-08-08 DIAGNOSIS — J432 Centrilobular emphysema: Secondary | ICD-10-CM

## 2022-08-08 DIAGNOSIS — F17200 Nicotine dependence, unspecified, uncomplicated: Secondary | ICD-10-CM

## 2022-08-08 LAB — COMPREHENSIVE METABOLIC PANEL
ALT: 19 U/L (ref 0–35)
AST: 17 U/L (ref 0–37)
Albumin: 4.7 g/dL (ref 3.5–5.2)
Alkaline Phosphatase: 75 U/L (ref 39–117)
BUN: 15 mg/dL (ref 6–23)
CO2: 30 mEq/L (ref 19–32)
Calcium: 10.4 mg/dL (ref 8.4–10.5)
Chloride: 100 mEq/L (ref 96–112)
Creatinine, Ser: 0.64 mg/dL (ref 0.40–1.20)
GFR: 103.2 mL/min (ref >60.00)
Glucose, Bld: 88 mg/dL (ref 70–99)
Potassium: 4.5 mEq/L (ref 3.5–5.1)
Sodium: 139 mEq/L (ref 135–145)
Total Bilirubin: 0.5 mg/dL (ref 0.2–1.2)
Total Protein: 7.4 g/dL (ref 6.0–8.3)

## 2022-08-08 LAB — LIPID PANEL
Cholesterol: 168 mg/dL (ref 0–200)
HDL: 63.2 mg/dL (ref >39.00)
LDL Cholesterol: 85 mg/dL (ref 0–99)
NonHDL: 105.07
Total CHOL/HDL Ratio: 3
Triglycerides: 102 mg/dL (ref 0.0–149.0)
VLDL: 20.4 mg/dL (ref 0.0–40.0)

## 2022-08-08 LAB — HEMOGLOBIN A1C: Hgb A1c MFr Bld: 6.5 % (ref 4.6–6.5)

## 2022-08-08 LAB — MICROALBUMIN / CREATININE URINE RATIO
Creatinine,U: 38.5 mg/dL
Microalb Creat Ratio: 1.8 mg/g (ref 0.0–30.0)
Microalb, Ur: <0.7 mg/dL (ref 0.0–1.9)

## 2022-08-08 LAB — TSH: TSH: 0.84 u[IU]/mL (ref 0.35–5.50)

## 2022-08-08 LAB — T4, FREE: Free T4: 1.07 ng/dL (ref 0.60–1.60)

## 2022-08-08 MED ORDER — IPRATROPIUM BROMIDE 0.03 % NA SOLN: 2.0000 | Freq: Two times a day (BID) | NASAL | 3 refills | Status: DC

## 2022-08-08 MED ORDER — BUDESONIDE-FORMOTEROL FUMARATE 160-4.5 MCG/ACT IN AERO: 2.0000 | INHALATION_SPRAY | Freq: Two times a day (BID) | RESPIRATORY_TRACT | 11 refills | Status: DC

## 2022-08-08 MED ORDER — SPIRIVA RESPIMAT 2.5 MCG/ACT IN AERS: 2.0000 | INHALATION_SPRAY | Freq: Every day | RESPIRATORY_TRACT | 11 refills | Status: DC

## 2022-08-08 MED ORDER — ALBUTEROL SULFATE HFA 108 (90 BASE) MCG/ACT IN AERS: 1.0000 | INHALATION_SPRAY | Freq: Four times a day (QID) | RESPIRATORY_TRACT | 3 refills | Status: DC | PRN

## 2022-08-08 MED ORDER — VITAMIN D3 25 MCG (1000 UT) PO CAPS: 1.0000 | ORAL_CAPSULE | Freq: Every day | ORAL | Status: AC

## 2022-08-08 MED ORDER — FLUTICASONE PROPIONATE 50 MCG/ACT NA SUSP: NASAL | 6 refills | Status: DC

## 2022-08-08 NOTE — Assessment & Plan Note (Deleted)
x

## 2022-08-08 NOTE — Patient Instructions (Addendum)
Flu shot today.  [id number] today.  Labs today  Pass by lab to pick up stool kit.  We will refer you to GYN for well woman exam.  We will refer you to lung cancer screening program.  Start vitamin D 1000 units daily.  Call to schedule mammogram at your convenience: [facility] at [facility] [phone].  Return as needed or in 1 year for next physical.   Health Maintenance for Postmenopausal Women Menopause is a normal process in which your ability to get pregnant comes to an end. This process happens slowly over many months or years, usually between the ages of 18 and 17. Menopause is complete when you have missed your menstrual period for 12 months. It is important to talk with your health care provider about some of the most common conditions that affect women after menopause (postmenopausal women). These include heart disease, cancer, and bone loss (osteoporosis). Adopting a healthy lifestyle and getting preventive care can help to promote your health and wellness. The actions you take can also lower your chances of developing some of these common conditions. What are the signs and symptoms of menopause? During menopause, you may have the following symptoms: Hot flashes. These can be moderate or severe. Night sweats. Decrease in sex drive. Mood swings. Headaches. Tiredness (fatigue). Irritability. Memory problems. Problems falling asleep or staying asleep. Talk with your health care provider about treatment options for your symptoms. Do I need hormone replacement therapy? Hormone replacement therapy is effective in treating symptoms that are caused by menopause, such as hot flashes and night sweats. Hormone replacement carries certain risks, especially as you become older. If you are thinking about using estrogen or estrogen with progestin, discuss the benefits and risks with your health care provider. How can I reduce my risk for heart disease and stroke? The risk of heart  disease, heart attack, and stroke increases as you age. One of the causes may be a change in the body's hormones during menopause. This can affect how your body uses dietary fats, triglycerides, and cholesterol. Heart attack and stroke are medical emergencies. There are many things that you can do to help prevent heart disease and stroke. Watch your blood pressure High blood pressure causes heart disease and increases the risk of stroke. This is more likely to develop in people who have high blood pressure readings or are overweight. Have your blood pressure checked: Every 3-5 years if you are 6-57 years of age. Every year if you are 87 years old or older. Eat a healthy diet  Eat a diet that includes plenty of vegetables, fruits, low-fat dairy products, and lean protein. Do not eat a lot of foods that are high in solid fats, added sugars, or sodium. Get regular exercise Get regular exercise. This is one of the most important things you can do for your health. Most adults should: Try to exercise for at least 150 minutes each week. The exercise should increase your heart rate and make you sweat (moderate-intensity exercise). Try to do strengthening exercises at least twice each week. Do these in addition to the moderate-intensity exercise. Spend less time sitting. Even light physical activity can be beneficial. Other tips Work with your health care provider to achieve or maintain a healthy weight. Do not use any products that contain nicotine or tobacco. These products include cigarettes, chewing tobacco, and vaping devices, such as e-cigarettes. If you need help quitting, ask your health care provider. Know your numbers. Ask your health care  provider to check your cholesterol and your blood sugar (glucose). Continue to have your blood tested as directed by your health care provider. Do I need screening for cancer? Depending on your health history and family history, you may need to have cancer  screenings at different stages of your life. This may include screening for: Breast cancer. Cervical cancer. Lung cancer. Colorectal cancer. What is my risk for osteoporosis? After menopause, you may be at increased risk for osteoporosis. Osteoporosis is a condition in which bone destruction happens more quickly than new bone creation. To help prevent osteoporosis or the bone fractures that can happen because of osteoporosis, you may take the following actions: If you are 68-64 years old, get at least 1,000 mg of calcium and at least 600 international units (IU) of vitamin D per day. If you are older than age 42 but younger than age 70, get at least 1,200 mg of calcium and at least 600 international units (IU) of vitamin D per day. If you are older than age 62, get at least 1,200 mg of calcium and at least 800 international units (IU) of vitamin D per day. Smoking and drinking excessive alcohol increase the risk of osteoporosis. Eat foods that are rich in calcium and vitamin D, and do weight-bearing exercises several times each week as directed by your health care provider. How does menopause affect my mental health? Depression may occur at any age, but it is more common as you become older. Common symptoms of depression include: Feeling depressed. Changes in sleep patterns. Changes in appetite or eating patterns. Feeling an overall lack of motivation or enjoyment of activities that you previously enjoyed. Frequent crying spells. Talk with your health care provider if you think that you are experiencing any of these symptoms. General instructions See your health care provider for regular wellness exams and vaccines. This may include: Scheduling regular health, dental, and eye exams. Getting and maintaining your vaccines. These include: Influenza vaccine. Get this vaccine each year before the flu season begins. Pneumonia vaccine. Shingles vaccine. Tetanus, diphtheria, and pertussis (Tdap)  booster vaccine. Your health care provider may also recommend other immunizations. Tell your health care provider if you have ever been abused or do not feel safe at home. Summary Menopause is a normal process in which your ability to get pregnant comes to an end. This condition causes hot flashes, night sweats, decreased interest in sex, mood swings, headaches, or lack of sleep. Treatment for this condition may include hormone replacement therapy. Take actions to keep yourself healthy, including exercising regularly, eating a healthy diet, watching your weight, and checking your blood pressure and blood sugar levels. Get screened for cancer and depression. Make sure that you are up to date with all your vaccines. This information is not intended to replace advice given to you by your health care provider. Make sure you discuss any questions you have with your health care provider. Document Revised: 12/10/2020 Document Reviewed: 12/10/2020 Elsevier Patient Education  [employer].

## 2022-08-08 NOTE — Assessment & Plan Note (Addendum)
Update TSH, fT4 today H/o benign thyroid biopsy 2017.

## 2022-08-08 NOTE — Assessment & Plan Note (Signed)
Preventative protocols reviewed and updated unless pt declined. Discussed healthy diet and lifestyle.  

## 2022-08-08 NOTE — Assessment & Plan Note (Signed)
Continue imdur, statin, aspirin, followed by cardiology.

## 2022-08-08 NOTE — Assessment & Plan Note (Signed)
Continue current regimen. Encouraged smoking cessation.

## 2022-08-08 NOTE — Progress Notes (Signed)
Patient ID: Makayla Green, female    DOB: [birth year], 52 y.o.   MRN: [id number]  This visit was conducted in person.  BP 126/80   Pulse 95   Temp (!) 97.2 F (36.2 C) (Temporal)   Ht 5' 6.5" (1.689 m)   Wt 154 lb 4 oz (70 kg)   SpO2 97%   BMI 24.52 kg/m    CC: CPE Subjective:   HPI: Makayla Green is a 52 y.o. female presenting on 08/08/2022 for Annual Exam   Just bought marmoset monkey.   Asthma/COPD - on spiriva and symbicort as well as flonase, allegra, albuterol inhaler PRN. Has had COVID infections 07/2019, 03/2022.   Singulair caused dyspnea so now off this.    Smoking - 1/2 ppd.    Preventative: Colon cancer screening - had requested iFOB but never completed. Discussed options - requests iFOB again.  Breast cancer screening - overdue. # again provided today.  Well woman exam - last pap normal 2015 with prior PCP. Requests GYN referral.  LMP ~2015. Was on depo shots for 10 yrs (2005-2015).  DEXA scan - not due - rec start age 56yo Lung cancer screening - discussed. Will refer.  Flu shot yearly COVID vaccine - declined Tdap 03/2021 Prevnar-20 - offered  Shingrix - discussed.  Seat belt use discussed  Sunscreen use discussed. No changing moles on skin.  Smoking - 1/2+ ppd, started age 51yo, about 11 PY hx  Sleep - averaging 8-9 hours/night Alcohol - none  Dentist - q6 mo Eye exam - yearly - has seen [facility] to monitor borderline glaucoma   Married. Lives with husband, 2 sons and FIL 3 children, 1 grandchild. Occ: works in Personal assistant.  Activity - no regular exercise Diet - good water, milk, fruits/vegetables daily      Relevant past medical, surgical, family and social history reviewed and updated as indicated. Interim medical history since our last visit reviewed. Allergies and medications reviewed and updated. Outpatient Medications Prior to Visit  Medication Sig Dispense Refill   aspirin EC 81 MG tablet Take 1 tablet (81 mg total) by  mouth daily. 90 tablet 3   fexofenadine (ALLEGRA ALLERGY) 180 MG tablet Take 1 tablet (180 mg total) by mouth daily as needed for allergies or rhinitis.     isosorbide mononitrate (IMDUR) 30 MG 24 hr tablet Take 1 tablet (30 mg total) by mouth daily. 90 tablet 1   rosuvastatin (CRESTOR) 5 MG tablet TAKE 1 TABLET BY MOUTH DAILY AT 6PM NEEDS APPT FOR REFILLS 30 tablet 11   albuterol (VENTOLIN HFA) 108 (90 Base) MCG/ACT inhaler Inhale 1-2 puffs into the lungs every 6 (six) hours as needed for wheezing or shortness of breath. Every 6 hours as needed 8 g 2   budesonide (PULMICORT) 0.5 MG/2ML nebulizer solution Take 2 mLs (0.5 mg total) by nebulization 2 (two) times daily as needed (during asthma flares/URIs for 1-2 weeks.). 60 mL 1   budesonide-formoterol (SYMBICORT) 160-4.5 MCG/ACT inhaler INHALE 2 PUFFS INTO THE LUNGS IN THE MORNING AND AT BEDTIME DAILY. 10.2 g 0   fluticasone (FLONASE) 50 MCG/ACT nasal spray SPRAY 2 SPRAYS INTO EACH NOSTRIL EVERY DAY 16 mL 3   ipratropium (ATROVENT) 0.03 % nasal spray Place 2 sprays into both nostrils every 12 (twelve) hours. 30 mL 2   Tiotropium Bromide Monohydrate (SPIRIVA RESPIMAT) 2.5 MCG/ACT AERS INHALE TWO PUFFS BY MOUTH INTO THE LUNGSDAILY. **NEEED APPT FOR ADDITIONAL REFILLS** 4 g 1   promethazine-dextromethorphan (  PROMETHAZINE-DM) 6.25-15 MG/5ML syrup Take 5 mLs by mouth every 4 (four) hours as needed.     pseudoephedrine-guaifenesin (MUCINEX D) 60-600 MG 12 hr tablet Take 1 tablet by mouth every 12 (twelve) hours.     SUMAtriptan (IMITREX) 100 MG tablet Take 1 tablet (100 mg total) by mouth once for 1 dose. May repeat in 2 hours if headache persists or recurs. 10 tablet 0   No facility-administered medications prior to visit.     Per HPI unless specifically indicated in ROS section below Review of Systems  Constitutional:  Negative for activity change, appetite change, chills, fatigue, fever and unexpected weight change.  HENT:  Negative for hearing loss.    Eyes:  Negative for visual disturbance.  Respiratory:  Negative for cough, chest tightness, shortness of breath and wheezing.   Cardiovascular:  Negative for chest pain, palpitations and leg swelling.  Gastrointestinal:  Negative for abdominal distention, abdominal pain, blood in stool, constipation, diarrhea, nausea and vomiting.  Genitourinary:  Negative for difficulty urinating and hematuria.  Musculoskeletal:  Negative for arthralgias, myalgias and neck pain.  Skin:  Negative for rash.  Neurological:  Negative for dizziness, seizures, syncope and headaches.  Hematological:  Negative for adenopathy. Does not bruise/bleed easily.  Psychiatric/Behavioral:  Negative for dysphoric mood. The patient is not nervous/anxious.     Objective:  BP 126/80   Pulse 95   Temp (!) 97.2 F (36.2 C) (Temporal)   Ht 5' 6.5" (1.689 m)   Wt 154 lb 4 oz (70 kg)   SpO2 97%   BMI 24.52 kg/m   Wt Readings from Last 3 Encounters:  08/08/22 154 lb 4 oz (70 kg)  03/18/22 160 lb (72.6 kg)  02/03/22 161 lb 8 oz (73.3 kg)      Physical Exam Vitals and nursing note reviewed.  Constitutional:      Appearance: Normal appearance. She is not ill-appearing.  HENT:     Head: Normocephalic and atraumatic.     Right Ear: Tympanic membrane, ear canal and external ear normal. There is no impacted cerumen.     Left Ear: Tympanic membrane, ear canal and external ear normal. There is no impacted cerumen.     Nose: Nose normal.     Mouth/Throat:     Mouth: Mucous membranes are moist.     Pharynx: Oropharynx is clear. No oropharyngeal exudate or posterior oropharyngeal erythema.  Eyes:     General:        Right eye: No discharge.        Left eye: No discharge.     Extraocular Movements: Extraocular movements intact.     Conjunctiva/sclera: Conjunctivae normal.     Pupils: Pupils are equal, round, and reactive to light.  Neck:     Thyroid: No thyroid mass or thyromegaly.  Cardiovascular:     Rate and Rhythm:  Normal rate and regular rhythm.     Pulses: Normal pulses.     Heart sounds: Normal heart sounds. No murmur heard. Pulmonary:     Effort: Pulmonary effort is normal. No respiratory distress.     Breath sounds: Normal breath sounds. No wheezing, rhonchi or rales.  Abdominal:     General: Bowel sounds are normal. There is no distension.     Palpations: Abdomen is soft. There is no mass.     Tenderness: There is no abdominal tenderness. There is no guarding or rebound.     Hernia: No hernia is present.  Musculoskeletal:  Cervical back: Normal range of motion and neck supple. No rigidity.     Right lower leg: No edema.     Left lower leg: No edema.  Lymphadenopathy:     Cervical: No cervical adenopathy.  Skin:    General: Skin is warm and dry.     Findings: No rash.  Neurological:     General: No focal deficit present.     Mental Status: She is alert. Mental status is at baseline.  Psychiatric:        Mood and Affect: Mood normal.        Behavior: Behavior normal.       Results for orders placed or performed in visit on 02/03/22  TSH  Result Value Ref Range   TSH 0.81 0.35 - 5.50 uIU/mL   Lab Results  Component Value Date   HGBA1C 6.5 11/29/2020    Lab Results  Component Value Date   CHOL 167 04/01/2019   HDL 59 04/01/2019   LDLCALC 83 04/01/2019   TRIG 149 04/01/2019   CHOLHDL 2.8 04/01/2019    Lab Results  Component Value Date   CREATININE 0.69 11/29/2020   BUN 14 11/29/2020   NA 141 11/29/2020   K 4.9 11/29/2020   CL 105 11/29/2020   CO2 28 11/29/2020    Assessment & Plan:   Problem List Items Addressed This Visit     Health maintenance examination - Primary (Chronic)    Preventative protocols reviewed and updated unless pt declined. Discussed healthy diet and lifestyle.       TOBACCO ABUSE    Encourage full smoking cessation.  Continued 1/2 ppd smoker, contemplative. Fmhx lung cancer.  Agrees for referral to lung cancer screening program.        Relevant Orders   Ambulatory Referral for Lung Cancer Scre   Hyperthyroidism    Update TSH, fT4 today H/o benign thyroid biopsy 2017.       Relevant Orders   TSH   Centrilobular emphysema (HCC)    Chronic, stable period on current regimen - continue. Encouraged full smoking cessation.       Relevant Medications   albuterol (VENTOLIN HFA) 108 (90 Base) MCG/ACT inhaler   budesonide-formoterol (SYMBICORT) 160-4.5 MCG/ACT inhaler   fluticasone (FLONASE) 50 MCG/ACT nasal spray   ipratropium (ATROVENT) 0.03 % nasal spray   Tiotropium Bromide Monohydrate (SPIRIVA RESPIMAT) 2.5 MCG/ACT AERS   Coronary artery disease involving native coronary artery of native heart    Continue imdur, statin, aspirin, followed by cardiology.       Severe persistent asthma without complication    Continue current regimen. Encouraged smoking cessation.       Relevant Medications   albuterol (VENTOLIN HFA) 108 (90 Base) MCG/ACT inhaler   budesonide-formoterol (SYMBICORT) 160-4.5 MCG/ACT inhaler   Tiotropium Bromide Monohydrate (SPIRIVA RESPIMAT) 2.5 MCG/ACT AERS   Controlled diabetes mellitus type 2 with complications (HCC)    Previously diet controlled - update labs.       Other Visit Diagnoses     Need for influenza vaccination       Relevant Orders   Flu Vaccine QUAD 72mo+IM (Fluarix, Fluzone & Alfiuria Quad PF) (Completed)   Special screening for malignant neoplasms, colon       Relevant Orders   Fecal occult blood, imunochemical   Seasonal allergic rhinitis, unspecified trigger       Relevant Medications   fluticasone (FLONASE) 50 MCG/ACT nasal spray   Encounter for screening mammogram for malignant neoplasm of breast  Relevant Orders   MM 3D SCREEN BREAST BILATERAL   Need for vaccination against Streptococcus pneumoniae       Relevant Orders   Pneumococcal conjugate vaccine 20-valent (Completed)   Well woman exam       Relevant Orders   Ambulatory referral to Gynecology         Meds ordered this encounter  Medications   Cholecalciferol (VITAMIN D3) 25 MCG (1000 UT) CAPS    Sig: Take 1 capsule (1,000 Units total) by mouth daily.    Dispense:  30 capsule   albuterol (VENTOLIN HFA) 108 (90 Base) MCG/ACT inhaler    Sig: Inhale 1-2 puffs into the lungs every 6 (six) hours as needed for wheezing or shortness of breath. Every 6 hours as needed    Dispense:  8 g    Refill:  3   budesonide-formoterol (SYMBICORT) 160-4.5 MCG/ACT inhaler    Sig: Inhale 2 puffs into the lungs in the morning and at bedtime.    Dispense:  10.2 g    Refill:  11   fluticasone (FLONASE) 50 MCG/ACT nasal spray    Sig: SPRAY 2 SPRAYS INTO EACH NOSTRIL EVERY DAY    Dispense:  16 mL    Refill:  6   ipratropium (ATROVENT) 0.03 % nasal spray    Sig: Place 2 sprays into both nostrils every 12 (twelve) hours.    Dispense:  30 mL    Refill:  3   Tiotropium Bromide Monohydrate (SPIRIVA RESPIMAT) 2.5 MCG/ACT AERS    Sig: Take 2 puffs by mouth daily.    Dispense:  4 g    Refill:  11   Orders Placed This Encounter  Procedures   Fecal occult blood, imunochemical    Standing Status:   Future    Standing Expiration Date:   08/09/2023   MM 3D SCREEN BREAST BILATERAL    Standing Status:   Future    Standing Expiration Date:   08/09/2023    Order Specific Question:   Reason for Exam (SYMPTOM  OR DIAGNOSIS REQUIRED)    Answer:   bresat cancer screening    Order Specific Question:   Preferred imaging location?    Answer:   [facility]    Order Specific Question:   Is the patient pregnant?    Answer:   No   Flu Vaccine QUAD 30mo+IM (Fluarix, Fluzone & Alfiuria Quad PF)   Pneumococcal conjugate vaccine 20-valent   TSH   Ambulatory Referral for Lung Cancer Scre    Referral Priority:   Routine    Referral Type:   Consultation    Referral Reason:   Specialty Services Required    Number of Visits Requested:   1   Ambulatory referral to Gynecology    Referral Priority:   Routine    Referral Type:    Consultation    Referral Reason:   Specialty Services Required    Requested Specialty:   Gynecology    Number of Visits Requested:   1     Patient instructions: Flu shot today.  [id number] today.  Labs today  Pass by lab to pick up stool kit.  We will refer you to GYN for well woman exam.  We will refer you to lung cancer screening program.  Start vitamin D 1000 units daily.  Call to schedule mammogram at your convenience: [facility] at [facility] [phone].  Return as needed or in 1 year for next physical.   Follow up plan: Return  in about 1 year (around 08/09/2023) for annual exam, prior fasting for blood work.  Eustaquio Boyden, MD

## 2022-08-08 NOTE — Assessment & Plan Note (Addendum)
Previously diet controlled - update labs.

## 2022-08-08 NOTE — Assessment & Plan Note (Addendum)
Chronic, stable period on current regimen - continue. Encouraged full smoking cessation.

## 2022-08-08 NOTE — Assessment & Plan Note (Addendum)
Encourage full smoking cessation.  Continued 1/2 ppd smoker, contemplative. Fmhx lung cancer.  Agrees for referral to lung cancer screening program.

## 2022-09-04 ENCOUNTER — Other Ambulatory Visit: Payer: Self-pay | Admitting: Cardiovascular Disease

## 2022-09-18 ENCOUNTER — Telehealth: Payer: 59 | Admitting: Nurse Practitioner

## 2022-09-18 DIAGNOSIS — R059 Cough, unspecified: Secondary | ICD-10-CM | POA: Diagnosis not present

## 2022-09-18 MED ORDER — BENZONATATE 100 MG PO CAPS: 100.0000 mg | ORAL_CAPSULE | Freq: Three times a day (TID) | ORAL | 0 refills | Status: DC | PRN

## 2022-09-18 MED ORDER — PREDNISONE 10 MG PO TABS: 10.0000 mg | ORAL_TABLET | Freq: Every day | ORAL | 0 refills | Status: DC

## 2022-09-18 NOTE — Progress Notes (Signed)
We are sorry that you are not feeling well.  Here is how we plan to help!  Based on your presentation I believe you most likely have A cough due to a virus.  This is called viral bronchitis and is best treated by rest, plenty of fluids and control of the cough.  You may use Ibuprofen or Tylenol as directed to help your symptoms.     In addition you may use A prescription cough medication called Tessalon Perles 136m. You may take 1-2 capsules every 8 hours as needed for your cough.  Prednisone 10 mg daily for 6 days (see taper instructions below)  Directions for 6 day taper: Day 1: 2 tablets before breakfast, 1 after both lunch & dinner and 2 at bedtime Day 2: 1 tab before breakfast, 1 after both lunch & dinner and 2 at bedtime Day 3: 1 tab at each meal & 1 at bedtime Day 4: 1 tab at breakfast, 1 at lunch, 1 at bedtime Day 5: 1 tab at breakfast & 1 tab at bedtime Day 6: 1 tab at breakfast  From your responses in the eVisit questionnaire you describe inflammation in the upper respiratory tract which is causing a significant cough.  This is commonly called Bronchitis and has four common causes:   Allergies Viral Infections Acid Reflux Bacterial Infection Allergies, viruses and acid reflux are treated by controlling symptoms or eliminating the cause. An example might be a cough caused by taking certain blood pressure medications. You stop the cough by changing the medication. Another example might be a cough caused by acid reflux. Controlling the reflux helps control the cough.  USE OF BRONCHODILATOR ("RESCUE") INHALERS: There is a risk from using your bronchodilator too frequently.  The risk is that over-reliance on a medication which only relaxes the muscles surrounding the breathing tubes can reduce the effectiveness of medications prescribed to reduce swelling and congestion of the tubes themselves.  Although you feel brief relief from the bronchodilator inhaler, your asthma may actually be  worsening with the tubes becoming more swollen and filled with mucus.  This can delay other crucial treatments, such as oral steroid medications. If you need to use a bronchodilator inhaler daily, several times per day, you should discuss this with your provider.  There are probably better treatments that could be used to keep your asthma under control.     HOME CARE Only take medications as instructed by your medical team. Complete the entire course of an antibiotic. Drink plenty of fluids and get plenty of rest. Avoid close contacts especially the very young and the elderly Cover your mouth if you cough or cough into your sleeve. Always remember to wash your hands A steam or ultrasonic humidifier can help congestion.   GET HELP RIGHT AWAY IF: You develop worsening fever. You become short of breath You cough up blood. Your symptoms persist after you have completed your treatment plan MAKE SURE YOU  Understand these instructions. Will watch your condition. Will get help right away if you are not doing well or get worse.    Thank you for choosing an e-visit.  Your e-visit answers were reviewed by a board certified advanced clinical practitioner to complete your personal care plan. Depending upon the condition, your plan could have included both over the counter or prescription medications.  Please review your pharmacy choice. Make sure the pharmacy is open so you can pick up prescription now. If there is a problem, you may contact your provider  through [employer] and have the prescription routed to another pharmacy.  Your safety is important to [place]. If you have drug allergies check your prescription carefully.   For the next 24 hours you can use MyChart to ask questions about today's visit, request a non-urgent call back, or ask for a work or school excuse. You will get an email in the next two days asking about your experience. I hope that your e-visit has been valuable and will  speed your recovery.   I have spent at least 5 minutes reviewing and documenting in the patient's chart.

## 2022-10-15 ENCOUNTER — Other Ambulatory Visit: Payer: Self-pay | Admitting: Cardiovascular Disease

## 2022-10-16 ENCOUNTER — Encounter: Payer: Self-pay | Admitting: Family Medicine

## 2022-10-16 DIAGNOSIS — J432 Centrilobular emphysema: Secondary | ICD-10-CM

## 2022-10-16 MED ORDER — SYMBICORT 160-4.5 MCG/ACT IN AERO: 2.0000 | INHALATION_SPRAY | Freq: Two times a day (BID) | RESPIRATORY_TRACT | 11 refills | Status: DC

## 2022-10-16 NOTE — Telephone Encounter (Signed)
Symbicort Last rx:  08/08/22. 10.2 g Last OV:  08/08/22, CPE Next OV:  08/14/23, CPE

## 2022-10-16 NOTE — Telephone Encounter (Signed)
ERx brand Symbicort

## 2022-10-27 ENCOUNTER — Telehealth: Payer: 59 | Admitting: Nurse Practitioner

## 2022-10-27 DIAGNOSIS — J440 Chronic obstructive pulmonary disease with acute lower respiratory infection: Secondary | ICD-10-CM

## 2022-10-27 DIAGNOSIS — J209 Acute bronchitis, unspecified: Secondary | ICD-10-CM | POA: Diagnosis not present

## 2022-10-27 DIAGNOSIS — U071 COVID-19: Secondary | ICD-10-CM

## 2022-10-27 DIAGNOSIS — Z8709 Personal history of other diseases of the respiratory system: Secondary | ICD-10-CM

## 2022-10-27 MED ORDER — PREDNISONE 10 MG PO TABS: ORAL_TABLET | ORAL | 0 refills | Status: DC

## 2022-10-27 MED ORDER — AZITHROMYCIN 250 MG PO TABS: ORAL_TABLET | ORAL | 0 refills | Status: AC

## 2022-10-27 NOTE — Progress Notes (Signed)
Virtual Visit Consent   Makayla Green, you are scheduled for a virtual visit with a Makayla Green provider today. Just as with appointments in the office, your consent must be obtained to participate. Your consent will be active for this visit and any virtual visit you may have with one of our providers in the next 365 days. If you have a MyChart account, a copy of this consent can be sent to you electronically.  As this is a virtual visit, video technology does not allow for your provider to perform a traditional examination. This may limit your provider's ability to fully assess your condition. If your provider identifies any concerns that need to be evaluated in person or the need to arrange testing (such as labs, EKG, etc.), we will make arrangements to do so. Although advances in technology are sophisticated, we cannot ensure that it will always work on either your end or our end. If the connection with a video visit is poor, the visit may have to be switched to a telephone visit. With either a video or telephone visit, we are not always able to ensure that we have a secure connection.  By engaging in this virtual visit, you consent to the provision of healthcare and authorize for your insurance to be billed (if applicable) for the services provided during this visit. Depending on your insurance coverage, you may receive a charge related to this service.  I need to obtain your verbal consent now. Are you willing to proceed with your visit today? Makayla Green has provided verbal consent on 10/27/2022 for a virtual visit (video or telephone). Makayla Schneiders, FNP  Date: 10/27/2022 6:08 PM  Virtual Visit via Video Note   I, Makayla Green, connected with  Makayla Green  ([id number], 1971/01/18) on 10/27/22 at  6:15 PM EDT by a video-enabled telemedicine application and verified that I am speaking with the correct person using two identifiers.  Location: Patient: Virtual Visit  Location Patient: Home Provider: Virtual Visit Location Provider: Home Office   I discussed the limitations of evaluation and management by telemedicine and the availability of in person appointments. The patient expressed understanding and agreed to proceed.    History of Present Illness: Makayla Green is a 52 y.o. who identifies as a female who was assigned female at birth, and is being seen today after testing positive for COVID at home.  Symptom onset was 10/19/22 Tested negative throughout last week most recent negative teste was 10/24/22 Today she tested positive 10/27/22 She has taken Paxlovid in the past and had an adverse reaction   Symptoms today: Cough (present now for 9 days)  Wheezing worsening today  Headache  Nasal congestion  Denies a fever   She uses Symbicort daily  Spiriva daily  Has been using her Albuterol every 3-4 hours today    Son and husband also had symptoms and tested positive over past weekend   Most recent [place] was 03/2022 Ended on steroids after adverse reaction to Paxlovid   She has been using Mucinex D for relief and Vicks nasal spray   Problems:  Patient Active Problem List   Diagnosis Date Noted   Recurrent headache 02/25/2022   Health maintenance examination 12/05/2020   Controlled diabetes mellitus type 2 with complications ([place]) [id number]   Severe persistent asthma without complication [id number]   COVID-19 vaccine dose declined 06/01/2020   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 06/01/2020   Drug reaction 05/29/2020   Tachycardia 10/29/2019   Other allergic  rhinitis 11/21/2016   Coronary artery disease involving native coronary artery of native heart    Thyroid nodule 10/23/2015   Centrilobular emphysema ([place]) 07/06/2014   Anxiety and depression 05/11/2014   Hyperthyroidism 04/20/2014   TOBACCO ABUSE 10/11/2010    Allergies:  Allergies  Allergen Reactions   Penicillins Shortness Of Breath and Swelling    Has patient had a PCN  reaction causing immediate rash, facial/tongue/throat swelling, SOB or lightheadedness with hypotension: Yes Has patient had a PCN reaction causing severe rash involving mucus membranes or skin necrosis: No Has patient had a PCN reaction that required hospitalization No Has patient had a PCN reaction occurring within the last 10 years: No If all of the above answers are "NO", then may proceed with Cephalosporin use.    Amitriptyline     Hallucinations   Methylprednisolone Acetate Other (See Comments)    Positive reaction to skin prick/intradermal testing by allergist   Shellfish Allergy     Patient has not eaten any shellfish and does not know if she is allergic. She has had radiology images using dye with no reactions noted.    Medications:  Current Outpatient Medications:    albuterol (VENTOLIN HFA) 108 (90 Base) MCG/ACT inhaler, Inhale 1-2 puffs into the lungs every 6 (six) hours as needed for wheezing or shortness of breath. Every 6 hours as needed, Disp: 8 g, Rfl: 3   aspirin EC 81 MG tablet, Take 1 tablet (81 mg total) by mouth daily., Disp: 90 tablet, Rfl: 3   benzonatate (TESSALON) 100 MG capsule, Take 1 capsule (100 mg total) by mouth 3 (three) times daily as needed for cough., Disp: 30 capsule, Rfl: 0   Cholecalciferol (VITAMIN D3) 25 MCG (1000 UT) CAPS, Take 1 capsule (1,000 Units total) by mouth daily., Disp: 30 capsule, Rfl:    fexofenadine (ALLEGRA ALLERGY) 180 MG tablet, Take 1 tablet (180 mg total) by mouth daily as needed for allergies or rhinitis., Disp: , Rfl:    fluticasone (FLONASE) 50 MCG/ACT nasal spray, SPRAY 2 SPRAYS INTO EACH NOSTRIL EVERY DAY, Disp: 16 mL, Rfl: 6   ipratropium (ATROVENT) 0.03 % nasal spray, Place 2 sprays into both nostrils every 12 (twelve) hours., Disp: 30 mL, Rfl: 3   isosorbide mononitrate (IMDUR) 30 MG 24 hr tablet, Take 1 tablet (30 mg total) by mouth daily. PATIENT NEED TO SCHEDULE ANNUAL APPOINTMENT FOR FUTURE REFILLS SECOND ATTEMPT, Disp: 15  tablet, Rfl: 0   predniSONE (DELTASONE) 10 MG tablet, Take 1 tablet (10 mg total) by mouth daily with breakfast. Day 1: 2 tablets before breakfast, 1 after both lunch & dinner and 2 at bedtime. Day 2: 1 tab before breakfast, 1 after both lunch & dinner and 2 at bedtime. Day 3: 1 tab at each meal & 1 at bedtime. Day 4: 1 tab at breakfast, 1 at lunch, 1 at bedtime. Day 5: 1 tab at breakfast & 1 tab at bedtime. Day 6: 1 tab at breakfast, Disp: 21 tablet, Rfl: 0   rosuvastatin (CRESTOR) 5 MG tablet, Take 1 tablet (5 mg total) by mouth daily. PATIENT NEED TO SCHEDULE ANNUAL APPOINTMENT FOR FUTURE REFILLS SECOND ATTEMPT, Disp: 15 tablet, Rfl: 0   SYMBICORT 160-4.5 MCG/ACT inhaler, Inhale 2 puffs into the lungs in the morning and at bedtime., Disp: 10.2 g, Rfl: 11   Tiotropium Bromide Monohydrate (SPIRIVA RESPIMAT) 2.5 MCG/ACT AERS, Take 2 puffs by mouth daily., Disp: 4 g, Rfl: 11  Observations/Objective: Patient is well-developed, well-nourished in no acute distress.  Resting comfortably  at home.  Head is normocephalic, atraumatic.  No labored breathing.  Speech is clear and coherent with logical content.  Patient is alert and oriented at baseline.    Assessment and Plan: 1. COVID-19 Continue Mucinex and push fluids   - predniSONE (DELTASONE) 10 MG tablet; Take 4 tablets (40mg ) on days 1-4, then 3 tablets (30mg ) on days 5-8, then 2 tablets (20mg ) on days 9-11, then 1 tablet daily for days 12-14. Take with food.  Dispense: 37 tablet; Refill: 0  2. Acute bronchitis with COPD ([place])  - predniSONE (DELTASONE) 10 MG tablet; Take 4 tablets (40mg ) on days 1-4, then 3 tablets (30mg ) on days 5-8, then 2 tablets (20mg ) on days 9-11, then 1 tablet daily for days 12-14. Take with food.  Dispense: 37 tablet; Refill: 0 - azithromycin (ZITHROMAX) 250 MG tablet; Take 2 tablets on day 1, then 1 tablet daily on days 2 through 5  Dispense: 6 tablet; Refill: 0  3. History of chronic bronchitis  - predniSONE  (DELTASONE) 10 MG tablet; Take 4 tablets (40mg ) on days 1-4, then 3 tablets (30mg ) on days 5-8, then 2 tablets (20mg ) on days 9-11, then 1 tablet daily for days 12-14. Take with food.  Dispense: 37 tablet; Refill: 0     Follow Up Instructions: I discussed the assessment and treatment plan with the patient. The patient was provided an opportunity to ask questions and all were answered. The patient agreed with the plan and demonstrated an understanding of the instructions.  A copy of instructions were sent to the patient via MyChart unless otherwise noted below.    The patient was advised to call back or seek an in-person evaluation if the symptoms worsen or if the condition fails to improve as anticipated.  Time:  I spent 20 minutes with the patient via telehealth technology discussing the above problems/concerns.    Makayla Schneiders, FNP

## 2022-11-06 ENCOUNTER — Other Ambulatory Visit: Payer: Self-pay | Admitting: Cardiovascular Disease

## 2022-11-07 ENCOUNTER — Telehealth: Payer: Self-pay | Admitting: Cardiovascular Disease

## 2022-11-07 ENCOUNTER — Other Ambulatory Visit: Payer: Self-pay | Admitting: Cardiovascular Disease

## 2022-11-07 MED ORDER — ROSUVASTATIN CALCIUM 5 MG PO TABS: 5.0000 mg | ORAL_TABLET | Freq: Every day | ORAL | 0 refills | Status: DC

## 2022-11-07 NOTE — Telephone Encounter (Signed)
Pt's medication was sent to pt's pharmacy as requested. Confirmation received.  °

## 2022-11-07 NOTE — Telephone Encounter (Signed)
*  STAT* If patient is at the pharmacy, call can be transferred to refill team.   1. Which medications need to be refilled? (please list name of each medication and dose if known)   rosuvastatin (CRESTOR) 5 MG tablet    2. Which pharmacy/location (including street and city if local pharmacy) is medication to be sent to? [facility] - [place], [place] - [address]   3. Do they need a 30 day or 90 day supply? 30

## 2022-12-05 ENCOUNTER — Other Ambulatory Visit: Payer: Self-pay | Admitting: Cardiovascular Disease

## 2022-12-23 NOTE — Progress Notes (Signed)
Cardiology Clinic Note   Patient Name: Makayla Green Date of Encounter: 12/26/2022  Primary Care Provider:  Eustaquio Boyden, MD Primary Cardiologist:  Thurmon Fair, MD  Patient Profile    Makayla Green 52 year old female presents to the clinic today for follow-up evaluation of her coronary artery disease and tachycardia.  Past Medical History    Past Medical History:  Diagnosis Date   Abnormal pap    LEEP 2005   Asthma    Coronary artery disease    COVID-19 virus infection 03/18/2022   History of Bell's palsy 1990s   per pt   Recurrent upper respiratory infection (URI)    Solitary pulmonary nodule 07/06/2014    4 mm pulmonary nodule seen on November 2015 CT chest    Tobacco use disorder    Past Surgical History:  Procedure Laterality Date   CARDIAC CATHETERIZATION N/A 11/14/2015   Procedure: Left Heart Cath and Coronary Angiography;  Surgeon: Iran Ouch, MD;  Location: [facility] IN[facility]ASIVE [facility];  Service: Cardiovascular;  Laterality: N/A;    Allergies  Allergies  Allergen Reactions   Penicillins Shortness Of Breath and Swelling    Has patient had a PCN reaction causing immediate rash, facial/tongue/throat swelling, SOB or lightheadedness with hypotension: Yes Has patient had a PCN reaction causing severe rash involving mucus membranes or skin necrosis: No Has patient had a PCN reaction that required hospitalization No Has patient had a PCN reaction occurring within the last 10 years: No If all of the above answers are "NO", then may proceed with Cephalosporin use.    Amitriptyline     Hallucinations   Methylprednisolone Acetate Other (See Comments)    Positive reaction to skin prick/intradermal testing by allergist   Shellfish Allergy     Patient has not eaten any shellfish and does not know if she is allergic. She has had radiology images using dye with no reactions noted.     History of Present Illness    Makayla Green has a PMH of  coronary artery disease, emphysema, allergic rhinitis, asthma, hyperthyroidism, type 2 diabetes, tobacco abuse, anxiety, depression, tachycardia, and recurrent headaches.  She also has a history of premature onset coronary artery disease noted on CT with only minor stenosis 4/18.  Dr. Royann Shivers previously noted trouble with coronary spasm.  She had good response to low-dose long-acting nitrates.  She did not need sublingual nitroglycerin for quite some time.  Her previous anginal pattern was pain in her left shoulder radiating towards her scapula area.  She was seen in follow-up by Dr. Royann Shivers 08/15/2020.  During that time she continued to smoke between half a pack and a pack of cigarettes per day.  She had tried to quit multiple times.  She tried Chantix, Wellbutrin, nicotine supplements.  Each time she suffered severe mood swings.  She has an allergy to nicotine patches.  She was contemplating trying nicotine inhaler.  She contracted COVID 12/20.  Postinfection she had a period of tachycardia which eventually returned to normal.  On follow-up 1/22 she denied chest pain, dyspnea, orthopnea, PND, syncope, neurological deficits, lower extremity swelling, and hemoptysis.  She presents to the clinic today for follow-up evaluation and states she continues to do well from a heart standpoint.  Her son who is 55 recently had COVID and also has symptoms of increased heart rate.  He is followed with [facility] pediatrics.  She continues to try to cut back on her smoking.  She is very  active with her rescue dogs and her monkey.  Her EKG today shows sinus rhythm left atrial enlargement 79 bpm her blood pressure is well-controlled.  Her main complaint today is with intermittent periods of lower extremity cramping.  Recently met done by PCP was normal.  I encouraged her to maintain hydration and supplement with electrolytes.  I will refer out her Imdur.  We will plan follow-up in 12 months.  Today she denies  chest pain, shortness of breath, lower extremity edema, fatigue, palpitations, melena, hematuria, hemoptysis, diaphoresis, weakness, presyncope, syncope, orthopnea, and PND.    Home Medications    Prior to Admission medications   Medication Sig Start Date End Date Taking? Authorizing Provider  albuterol (VENTOLIN HFA) 108 (90 Base) MCG/ACT inhaler Inhale 1-2 puffs into the lungs every 6 (six) hours as needed for wheezing or shortness of breath. Every 6 hours as needed 08/08/22   Eustaquio Boyden, MD  aspirin EC 81 MG tablet Take 1 tablet (81 mg total) by mouth daily. 11/08/15   Almond Lint, MD  benzonatate (TESSALON) 100 MG capsule Take 1 capsule (100 mg total) by mouth 3 (three) times daily as needed for cough. 09/18/22   Leath-Warren, Sadie Haber, NP  Cholecalciferol (VITAMIN D3) 25 MCG (1000 UT) CAPS Take 1 capsule (1,000 Units total) by mouth daily. 08/08/22   Eustaquio Boyden, MD  fexofenadine [facility] ALLERGY) 180 MG tablet Take 1 tablet (180 mg total) by mouth daily as needed for allergies or rhinitis. 10/28/19   Eustaquio Boyden, MD  fluticasone [facility]) 50 MCG/ACT nasal spray SPRAY 2 SPRAYS INTO EACH NOSTRIL EVERY DAY 08/08/22   Eustaquio Boyden, MD  ipratropium (ATROVENT) 0.03 % nasal spray Place 2 sprays into both nostrils every 12 (twelve) hours. 08/08/22   Eustaquio Boyden, MD  isosorbide mononitrate (IMDUR) 30 MG 24 hr tablet Take 1 tablet (30 mg total) by mouth daily. Must keep scheduled appointment for future refills. Thank you. 12/05/22   Croitoru, Rachelle Hora, MD  predniSONE (DELTASONE) 10 MG tablet Take 4 tablets (40mg ) on days 1-4, then 3 tablets (30mg ) on days 5-8, then 2 tablets (20mg ) on days 9-11, then 1 tablet daily for days 12-14. Take with food. 10/27/22   Viviano Simas, FNP  rosuvastatin (CRESTOR) 5 MG tablet Take 1 tablet (5 mg total) by mouth daily. 11/07/22   Croitoru, Mihai, MD  SYMBICORT 160-4.5 MCG/ACT inhaler Inhale 2 puffs into the lungs in the morning and at bedtime. 10/16/22    Eustaquio Boyden, MD  Tiotropium Bromide Monohydrate (SPIRIVA RESPIMAT) 2.5 MCG/ACT AERS Take 2 puffs by mouth daily. 08/08/22   Eustaquio Boyden, MD    Family History    Family History  Problem Relation Age of Onset   COPD Mother    Heart disease Mother    Cancer Mother        metastatic   Neuropathy Father    COPD Father    Lung cancer Father    Thyroid disease Neg Hx    She indicated that her mother is alive. She indicated that her father is alive. She indicated that her brother is alive. She indicated that her maternal grandmother is deceased. She indicated that her maternal grandfather is deceased. She indicated that her paternal grandmother is deceased. She indicated that her paternal grandfather is deceased. She indicated that all of her three sons are deceased. She indicated that the status of her neg hx is unknown.  Social History    Social History   Socioeconomic History   Marital status:  Married    Spouse name: Not on file   Number of children: 3   Years of education: Not on file   Highest education level: Not on file  Occupational History   Not on file  Tobacco Use   Smoking status: Every Day    Packs/day: 1.00    Years: 25.00    Additional pack years: 0.00    Total pack years: 25.00    Types: Cigarettes   Smokeless tobacco: Never  Vaping Use   Vaping Use: Never used  Substance and Sexual Activity   Alcohol use: No    Alcohol/week: 0.0 standard drinks of alcohol   Drug use: No   Sexual activity: Not on file  Other Topics Concern   Not on file  Social History Narrative   Married. Lives with husband, 2 sons and FIL   3 children, 1 grandchild.   Occ: works in [employer], [employer].    Social Determinants of Health   Financial Resource Strain: Not on file  Food Insecurity: Not on file  Transportation Needs: Not on file  Physical Activity: Not on file  Stress: Not on file  Social Connections: Not on file  Intimate Partner Violence: Not on file     Review  of Systems    General:  No chills, fever, night sweats or weight changes.  Cardiovascular:  No chest pain, dyspnea on exertion, edema, orthopnea, palpitations, paroxysmal nocturnal dyspnea. Dermatological: No rash, lesions/masses Respiratory: No cough, dyspnea Urologic: No hematuria, dysuria Abdominal:   No nausea, vomiting, diarrhea, bright red blood per rectum, melena, or hematemesis Neurologic:  No visual changes, wkns, changes in mental status. All other systems reviewed and are otherwise negative except as noted above.  Physical Exam    VS:  BP 102/64   Pulse 79   Ht 5\' 8"  (1.727 m)   Wt 152 lb 6.4 oz (69.1 kg)   SpO2 96%   BMI 23.17 kg/m  , BMI Body mass index is 23.17 kg/m. GEN: Well nourished, well developed, in no acute distress. HEENT: normal. Neck: Supple, no JVD, carotid bruits, or masses. Cardiac: RRR, no murmurs, rubs, or gallops. No clubbing, cyanosis, edema.  Radials/DP/PT 2+ and equal bilaterally.  Respiratory:  Respirations regular and unlabored, clear to auscultation bilaterally. GI: Soft, nontender, nondistended, BS + x 4. MS: no deformity or atrophy. Skin: warm and dry, no rash. Neuro:  Strength and sensation are intact. Psych: Normal affect.  Accessory Clinical Findings    Recent Labs: 08/08/2022: ALT 19; BUN 15; Creatinine, Ser 0.64; Potassium 4.5; Sodium 139; TSH 0.84   Recent Lipid Panel    Component Value Date/Time   CHOL 168 08/08/2022 1313   TRIG 102.0 08/08/2022 1313   HDL 63.20 08/08/2022 1313   CHOLHDL 3 08/08/2022 1313   VLDL 20.4 08/08/2022 1313   LDLCALC 85 08/08/2022 1313   LDLCALC 83 04/01/2019 1501         ECG personally reviewed by me today-normal sinus rhythm possible left atrial enlargement septal infarct undetermined age 28 bpm- No acute changes  Cardiac catheterization 11/14/2015  Ost LAD to Prox LAD lesion, 10% stenosed. The left ventricular systolic function is normal.   1. Mildly calcified ostial LAD with no evidence  of obstructive disease. 2. Normal LV systolic function.   Recommendations: Medical therapy of risk factors.    Assessment & Plan   1.  Coronary artery disease, documented angina with spasm-no chest pain today.  Denies recent episodes of arm neck back or chest  discomfort.  Noted to have coronary calcifications on CT 4/18. Continue current medical therapy High-fiber diet Increase physical activity as tolerated  Hyperlipidemia-LDL 85. Continue rosuvastatin High-fiber diet-encouraged Increase physical activity as tolerated Follows with PCP  Tobacco use-continues to smoke half pack per day or more.  Smoking cessation strongly recommended   Disposition: Follow-up with Dr. Royann Shivers or me in 12 months.   Thomasene Ripple. Sears Oran NP-C     12/26/2022, 2:37 PM [facility] [address] Office [phone] Fax [phone]    I spent 13 minutes examining this patient, reviewing medications, and using patient centered shared decision making involving her cardiac care.  Prior to her visit I spent greater than 20 minutes reviewing her past medical history,  medications, and prior cardiac tests.

## 2022-12-26 ENCOUNTER — Encounter: Payer: Self-pay | Admitting: General Practice

## 2022-12-26 ENCOUNTER — Other Ambulatory Visit: Payer: Self-pay | Admitting: Cardiovascular Disease

## 2022-12-26 ENCOUNTER — Ambulatory Visit: Payer: 59 | Attending: General Practice | Admitting: General Practice

## 2022-12-26 VITALS — BP 102/64 | HR 79 | Ht 68.0 in | Wt 152.4 lb

## 2022-12-26 DIAGNOSIS — F17200 Nicotine dependence, unspecified, uncomplicated: Secondary | ICD-10-CM

## 2022-12-26 DIAGNOSIS — R079 Chest pain, unspecified: Secondary | ICD-10-CM | POA: Diagnosis not present

## 2022-12-26 DIAGNOSIS — E785 Hyperlipidemia, unspecified: Secondary | ICD-10-CM

## 2022-12-26 DIAGNOSIS — I2510 Atherosclerotic heart disease of native coronary artery without angina pectoris: Secondary | ICD-10-CM | POA: Diagnosis not present

## 2022-12-26 MED ORDER — ISOSORBIDE MONONITRATE ER 30 MG PO TB24: 30.0000 mg | ORAL_TABLET | Freq: Every day | ORAL | 3 refills | Status: DC

## 2022-12-26 NOTE — Patient Instructions (Signed)
Medication Instructions:   Medication refilled  *If you need a refill on your cardiac medications before your next appointment, please call your pharmacy*  Other Instructions  Increase fiber intake  For your  cramp  as needed use Gatorade every other day  ( low sugar content)    Lab Work: Not needed  .   Testing/Procedures:  Not needed  Follow-Up: At [facility], you and your health needs are our priority.  As part of our continuing mission to provide you with exceptional heart care, we have created designated Provider Care Teams.  These Care Teams include your primary Cardiologist (physician) and Advanced Practice Providers (APPs -  Physician Assistants and Nurse Practitioners) who all work together to provide you with the care you need, when you need it.     Your next appointment:   12 month(s)  The format for your next appointment:   In Person  Provider:   Thurmon Fair, MD    Other Instructions  Increase fiber intake  For your  cramp  as needed use Gatorade every other day  ( low sugar content)

## 2022-12-31 ENCOUNTER — Telehealth: Payer: 59 | Admitting: Physician Assistant

## 2022-12-31 DIAGNOSIS — B9689 Other specified bacterial agents as the cause of diseases classified elsewhere: Secondary | ICD-10-CM

## 2022-12-31 DIAGNOSIS — J209 Acute bronchitis, unspecified: Secondary | ICD-10-CM | POA: Diagnosis not present

## 2022-12-31 DIAGNOSIS — J208 Acute bronchitis due to other specified organisms: Secondary | ICD-10-CM

## 2022-12-31 DIAGNOSIS — J440 Chronic obstructive pulmonary disease with acute lower respiratory infection: Secondary | ICD-10-CM

## 2022-12-31 MED ORDER — AZITHROMYCIN 250 MG PO TABS: ORAL_TABLET | ORAL | 0 refills | Status: AC

## 2022-12-31 MED ORDER — PREDNISONE 10 MG PO TABS: ORAL_TABLET | ORAL | 0 refills | Status: DC

## 2022-12-31 NOTE — Progress Notes (Signed)
I have spent 5 minutes in review of e-visit questionnaire, review and updating patient chart, medical decision making and response to patient.   Sherril Shipman Cody Darcy Barbara, PA-C    

## 2022-12-31 NOTE — Progress Notes (Signed)
E-Visit for Cough  We are sorry that you are not feeling well.  Here is how we plan to help!  Based on your presentation I believe you most likely have A cough due to bacteria.  When patients have a fever and a productive cough with a change in color or increased sputum production, we are concerned about bacterial bronchitis.  If left untreated it can progress to pneumonia.  If your symptoms do not improve with your treatment plan it is important that you contact your provider.   I have prescribed Azithromyin 250 mg: two tablets now and then one tablet daily for 4 additonal days  I have also sent in a steroid taper to use as directed.  From your responses in the eVisit questionnaire you describe inflammation in the upper respiratory tract which is causing a significant cough.  This is commonly called Bronchitis and has four common causes:   Allergies Viral Infections Acid Reflux Bacterial Infection Allergies, viruses and acid reflux are treated by controlling symptoms or eliminating the cause. An example might be a cough caused by taking certain blood pressure medications. You stop the cough by changing the medication. Another example might be a cough caused by acid reflux. Controlling the reflux helps control the cough.  USE OF BRONCHODILATOR ("RESCUE") INHALERS: There is a risk from using your bronchodilator too frequently.  The risk is that over-reliance on a medication which only relaxes the muscles surrounding the breathing tubes can reduce the effectiveness of medications prescribed to reduce swelling and congestion of the tubes themselves.  Although you feel brief relief from the bronchodilator inhaler, your asthma may actually be worsening with the tubes becoming more swollen and filled with mucus.  This can delay other crucial treatments, such as oral steroid medications. If you need to use a bronchodilator inhaler daily, several times per day, you should discuss this with your provider.   There are probably better treatments that could be used to keep your asthma under control.     HOME CARE Only take medications as instructed by your medical team. Complete the entire course of an antibiotic. Drink plenty of fluids and get plenty of rest. Avoid close contacts especially the very young and the elderly Cover your mouth if you cough or cough into your sleeve. Always remember to wash your hands A steam or ultrasonic humidifier can help congestion.   GET HELP RIGHT AWAY IF: You develop worsening fever. You become short of breath You cough up blood. Your symptoms persist after you have completed your treatment plan MAKE SURE YOU  Understand these instructions. Will watch your condition. Will get help right away if you are not doing well or get worse.    Thank you for choosing an e-visit.  Your e-visit answers were reviewed by a board certified advanced clinical practitioner to complete your personal care plan. Depending upon the condition, your plan could have included both over the counter or prescription medications.  Please review your pharmacy choice. Make sure the pharmacy is open so you can pick up prescription now. If there is a problem, you may contact your provider through [employer] and have the prescription routed to another pharmacy.  Your safety is important to [place]. If you have drug allergies check your prescription carefully.   For the next 24 hours you can use MyChart to ask questions about today's visit, request a non-urgent call back, or ask for a work or school excuse. You will get an email in the  next two days asking about your experience. I hope that your e-visit has been valuable and will speed your recovery.

## 2023-02-07 ENCOUNTER — Telehealth: Payer: 59 | Admitting: Nurse Practitioner

## 2023-02-07 DIAGNOSIS — J0100 Acute maxillary sinusitis, unspecified: Secondary | ICD-10-CM

## 2023-02-07 MED ORDER — DOXYCYCLINE HYCLATE 100 MG PO TABS: 100.0000 mg | ORAL_TABLET | Freq: Two times a day (BID) | ORAL | 0 refills | Status: DC

## 2023-02-07 MED ORDER — PREDNISONE 20 MG PO TABS: 40.0000 mg | ORAL_TABLET | Freq: Every day | ORAL | 0 refills | Status: AC

## 2023-02-07 NOTE — Addendum Note (Signed)
Addended by: Bennie Pierini on: 02/07/2023 01:00 PM   Modules accepted: Orders

## 2023-02-07 NOTE — Progress Notes (Signed)
E-Visit for Sinus Problems  We are sorry that you are not feeling well.  Here is how we plan to help!  Based on what you have shared with me it looks like you have sinusitis.  Sinusitis is inflammation and infection in the sinus cavities of the head.  Based on your presentation I believe you most likely have Acute Bacterial Sinusitis.  This is an infection caused by bacteria and is treated with antibiotics. I have prescribed Doxycycline 100mg  by mouth twice a day for 10 days. You may use an oral decongestant such as Mucinex D or if you have glaucoma or high blood pressure use plain Mucinex. Saline nasal spray help and can safely be used as often as needed for congestion.  If you develop worsening sinus pain, fever or notice severe headache and vision changes, or if symptoms are not better after completion of antibiotic, please schedule an appointment with a health care provider.    We do not normally give steroids for a sinus infection.  Sinus infections are not as easily transmitted as other respiratory infection, however we still recommend that you avoid close contact with loved ones, especially the very young and elderly.  Remember to wash your hands thoroughly throughout the day as this is the number one way to prevent the spread of infection!  Home Care: Only take medications as instructed by your medical team. Complete the entire course of an antibiotic. Do not take these medications with alcohol. A steam or ultrasonic humidifier can help congestion.  You can place a towel over your head and breathe in the steam from hot water coming from a faucet. Avoid close contacts especially the very young and the elderly. Cover your mouth when you cough or sneeze. Always remember to wash your hands.  Get Help Right Away If: You develop worsening fever or sinus pain. You develop a severe head ache or visual changes. Your symptoms persist after you have completed your treatment plan.  Make sure  you Understand these instructions. Will watch your condition. Will get help right away if you are not doing well or get worse.  Thank you for choosing an e-visit.  Your e-visit answers were reviewed by a board certified advanced clinical practitioner to complete your personal care plan. Depending upon the condition, your plan could have included both over the counter or prescription medications.  Please review your pharmacy choice. Make sure the pharmacy is open so you can pick up prescription now. If there is a problem, you may contact your provider through [employer] and have the prescription routed to another pharmacy.  Your safety is important to [place]. If you have drug allergies check your prescription carefully.   For the next 24 hours you can use MyChart to ask questions about today's visit, request a non-urgent call back, or ask for a work or school excuse. You will get an email in the next two days asking about your experience. I hope that your e-visit has been valuable and will speed your recovery.  Makayla Daphine Deutscher, FNP   5-10 minutes spent reviewing and documenting in chart.

## 2023-02-16 ENCOUNTER — Other Ambulatory Visit: Payer: Self-pay | Admitting: Cardiovascular Disease

## 2023-04-14 ENCOUNTER — Other Ambulatory Visit: Payer: Self-pay | Admitting: Family Medicine

## 2023-05-08 ENCOUNTER — Other Ambulatory Visit: Payer: Self-pay | Admitting: Family Medicine

## 2023-05-08 DIAGNOSIS — Z1212 Encounter for screening for malignant neoplasm of rectum: Secondary | ICD-10-CM

## 2023-05-08 DIAGNOSIS — Z1211 Encounter for screening for malignant neoplasm of colon: Secondary | ICD-10-CM

## 2023-05-13 ENCOUNTER — Telehealth: Payer: 59 | Admitting: Physician Assistant

## 2023-05-13 DIAGNOSIS — B9689 Other specified bacterial agents as the cause of diseases classified elsewhere: Secondary | ICD-10-CM

## 2023-05-13 DIAGNOSIS — J208 Acute bronchitis due to other specified organisms: Secondary | ICD-10-CM

## 2023-05-13 DIAGNOSIS — J441 Chronic obstructive pulmonary disease with (acute) exacerbation: Secondary | ICD-10-CM

## 2023-05-13 MED ORDER — DOXYCYCLINE HYCLATE 100 MG PO TABS
100.0000 mg | ORAL_TABLET | Freq: Two times a day (BID) | ORAL | 0 refills | Status: DC
Start: 2023-05-13 — End: 2023-08-14

## 2023-05-13 MED ORDER — BENZONATATE 100 MG PO CAPS
100.0000 mg | ORAL_CAPSULE | Freq: Three times a day (TID) | ORAL | 0 refills | Status: DC | PRN
Start: 2023-05-13 — End: 2023-08-14

## 2023-05-13 MED ORDER — PREDNISONE 20 MG PO TABS
40.0000 mg | ORAL_TABLET | Freq: Every day | ORAL | 0 refills | Status: DC
Start: 2023-05-13 — End: 2023-08-14

## 2023-05-13 NOTE — Progress Notes (Signed)
E-Visit for Cough   We are sorry that you are not feeling well.  Here is how we plan to help!  Based on your presentation I believe you most likely have A cough due to bacteria.  When patients have a fever and a productive cough with a change in color or increased sputum production, we are concerned about bacterial bronchitis.  If left untreated it can progress to pneumonia.  If your symptoms do not improve with your treatment plan it is important that you contact your provider.   I have prescribed Doxycycline 100 mg twice a day for 7 days     In addition you may use A prescription cough medication called Tessalon Perles 100mg . You may take 1-2 capsules every 8 hours as needed for your cough.  I have added on a 5-day burst of prednisone due to the exacerbation of your COPD  From your responses in the eVisit questionnaire you describe inflammation in the upper respiratory tract which is causing a significant cough.  This is commonly called Bronchitis and has four common causes:   Allergies Viral Infections Acid Reflux Bacterial Infection Allergies, viruses and acid reflux are treated by controlling symptoms or eliminating the cause. An example might be a cough caused by taking certain blood pressure medications. You stop the cough by changing the medication. Another example might be a cough caused by acid reflux. Controlling the reflux helps control the cough.  USE OF BRONCHODILATOR ("RESCUE") INHALERS: There is a risk from using your bronchodilator too frequently.  The risk is that over-reliance on a medication which only relaxes the muscles surrounding the breathing tubes can reduce the effectiveness of medications prescribed to reduce swelling and congestion of the tubes themselves.  Although you feel brief relief from the bronchodilator inhaler, your asthma may actually be worsening with the tubes becoming more swollen and filled with mucus.  This can delay other crucial treatments, such as  oral steroid medications. If you need to use a bronchodilator inhaler daily, several times per day, you should discuss this with your provider.  There are probably better treatments that could be used to keep your asthma under control.     HOME CARE Only take medications as instructed by your medical team. Complete the entire course of an antibiotic. Drink plenty of fluids and get plenty of rest. Avoid close contacts especially the very young and the elderly Cover your mouth if you cough or cough into your sleeve. Always remember to wash your hands A steam or ultrasonic humidifier can help congestion.   GET HELP RIGHT AWAY IF: You develop worsening fever. You become short of breath You cough up blood. Your symptoms persist after you have completed your treatment plan MAKE SURE YOU  Understand these instructions. Will watch your condition. Will get help right away if you are not doing well or get worse.    Thank you for choosing an e-visit.  Your e-visit answers were reviewed by a board certified advanced clinical practitioner to complete your personal care plan. Depending upon the condition, your plan could have included both over the counter or prescription medications.  Please review your pharmacy choice. Make sure the pharmacy is open so you can pick up prescription now. If there is a problem, you may contact your provider through [employer] and have the prescription routed to another pharmacy.  Your safety is important to [place]. If you have drug allergies check your prescription carefully.   For the next 24 hours you  can use MyChart to ask questions about today's visit, request a non-urgent call back, or ask for a work or school excuse. You will get an email in the next two days asking about your experience. I hope that your e-visit has been valuable and will speed your recovery.

## 2023-05-13 NOTE — Progress Notes (Signed)
I have spent 5 minutes in review of e-visit questionnaire, review and updating patient chart, medical decision making and response to patient.   Mia Milan Cody Jacklynn Dehaas, PA-C    

## 2023-06-26 ENCOUNTER — Ambulatory Visit (INDEPENDENT_AMBULATORY_CARE_PROVIDER_SITE_OTHER): Payer: 59

## 2023-06-26 DIAGNOSIS — Z23 Encounter for immunization: Secondary | ICD-10-CM | POA: Diagnosis not present

## 2023-07-17 ENCOUNTER — Other Ambulatory Visit: Payer: Self-pay | Admitting: Family Medicine

## 2023-07-17 DIAGNOSIS — J4550 Severe persistent asthma, uncomplicated: Secondary | ICD-10-CM

## 2023-07-17 NOTE — Telephone Encounter (Signed)
Albuterol inh Last filled:  06/10/23, #8 g Last OV:  08/08/22, CPE Next OV:  08/14/23, CPE

## 2023-08-01 ENCOUNTER — Other Ambulatory Visit: Payer: Self-pay | Admitting: Family Medicine

## 2023-08-01 DIAGNOSIS — E041 Nontoxic single thyroid nodule: Secondary | ICD-10-CM

## 2023-08-01 DIAGNOSIS — E0590 Thyrotoxicosis, unspecified without thyrotoxic crisis or storm: Secondary | ICD-10-CM

## 2023-08-01 DIAGNOSIS — E1169 Type 2 diabetes mellitus with other specified complication: Secondary | ICD-10-CM

## 2023-08-07 ENCOUNTER — Other Ambulatory Visit (INDEPENDENT_AMBULATORY_CARE_PROVIDER_SITE_OTHER): Payer: 59

## 2023-08-07 DIAGNOSIS — E1169 Type 2 diabetes mellitus with other specified complication: Secondary | ICD-10-CM

## 2023-08-07 DIAGNOSIS — E0590 Thyrotoxicosis, unspecified without thyrotoxic crisis or storm: Secondary | ICD-10-CM

## 2023-08-07 LAB — LIPID PANEL
Cholesterol: 182 mg/dL (ref 0–200)
HDL: 69.5 mg/dL (ref >39.00)
LDL Cholesterol: 87 mg/dL (ref 0–99)
NonHDL: 112.64
Total CHOL/HDL Ratio: 3
Triglycerides: 127 mg/dL (ref 0.0–149.0)
VLDL: 25.4 mg/dL (ref 0.0–40.0)

## 2023-08-07 LAB — HEMOGLOBIN A1C: Hgb A1c MFr Bld: 6.7 % — ABNORMAL HIGH (ref 4.6–6.5)

## 2023-08-07 LAB — T4, FREE: Free T4: 1.02 ng/dL (ref 0.60–1.60)

## 2023-08-07 LAB — COMPREHENSIVE METABOLIC PANEL
ALT: 18 U/L (ref 0–35)
AST: 16 U/L (ref 0–37)
Albumin: 4.7 g/dL (ref 3.5–5.2)
Alkaline Phosphatase: 82 U/L (ref 39–117)
BUN: 19 mg/dL (ref 6–23)
CO2: 28 meq/L (ref 19–32)
Calcium: 10.4 mg/dL (ref 8.4–10.5)
Chloride: 102 meq/L (ref 96–112)
Creatinine, Ser: 0.61 mg/dL (ref 0.40–1.20)
GFR: 103.68 mL/min (ref >60.00)
Glucose, Bld: 100 mg/dL — ABNORMAL HIGH (ref 70–99)
Potassium: 5 meq/L (ref 3.5–5.1)
Sodium: 139 meq/L (ref 135–145)
Total Bilirubin: 0.3 mg/dL (ref 0.2–1.2)
Total Protein: 7.5 g/dL (ref 6.0–8.3)

## 2023-08-07 LAB — MICROALBUMIN / CREATININE URINE RATIO
Creatinine,U: 35.1 mg/dL
Microalb Creat Ratio: 2 mg/g (ref 0.0–30.0)
Microalb, Ur: <0.7 mg/dL (ref 0.0–1.9)

## 2023-08-07 LAB — TSH: TSH: 1.11 u[IU]/mL (ref 0.35–5.50)

## 2023-08-10 LAB — TRAB (TSH RECEPTOR BINDING ANTIBODY): TRAB: <1 [IU]/L (ref <=2.00)

## 2023-08-10 LAB — T3: T3, Total: 128 ng/dL (ref 76–181)

## 2023-08-10 LAB — THYROID PEROXIDASE ANTIBODIES (TPO) (REFL): Thyroperoxidase Ab SerPl-aCnc: <1 [IU]/mL (ref <9)

## 2023-08-13 ENCOUNTER — Telehealth: Payer: Self-pay | Admitting: Acute Care

## 2023-08-13 ENCOUNTER — Other Ambulatory Visit: Payer: Self-pay

## 2023-08-13 DIAGNOSIS — Z87891 Personal history of nicotine dependence: Secondary | ICD-10-CM

## 2023-08-13 DIAGNOSIS — Z122 Encounter for screening for malignant neoplasm of respiratory organs: Secondary | ICD-10-CM

## 2023-08-13 DIAGNOSIS — F17210 Nicotine dependence, cigarettes, uncomplicated: Secondary | ICD-10-CM

## 2023-08-13 NOTE — Telephone Encounter (Signed)
 Lung Cancer Screening Narrative/Criteria Questionnaire (Cigarette Smokers Only- No Cigars/Pipes/vapes)   Persis Macario Free     SDMV:09/02/23 at 9am                Provider: Laneta  Mar 01, 1971                                      LDCT: 09/09/23 at 1pm at [facility]    53 y.o.   Phone: [phone]   Lung Screening Narrative   Before calling, confirm age (50-77 yrs Medicare / 50-80 yrs Private pay insurance)  Insurance information: [employer]   I am calling at the request of Rilla (referring provider) to schedule you for a lung screening.  Did your provider discuss this with you? Yes   This screening involves an initial meeting with our NP, who is the Nurse Navigator for the program.  It is called a shared decision making visit.  The initial meeting is required by insurance and Medicare to make sure you understand the program.  This appointment takes about 20 minutes to complete.  After you have spoken with the provider, we will schedule you for your screening scan.  This scan takes about 5-10 minutes to complete.  You can eat and drink normally before and after the scan.    I am going to ask you a few questions to make sure you meet the criteria to participate in the program.    Are you a current or former smoker? Current Age began smoking: 53 yo   If you are a former smoker, what year did you quit smoking? NA (must be within 15 yrs)    To calculate your smoking history, I need an accurate estimate of how many packs of cigarettes you smoked per day and   for how many years.    Years smoking 35 x Packs per day 1.25 = Pack years 64   (at least 20 pack yrs)   (Make sure they understand that we need to know how much they have smoked in the past, not just the number of PPD they are smoking now)  Do you have a personal history of cancer?  No    Do you have a family history of cancer? Yes  (cancer type and and relative) Mother/unsure type and Father/lung  Are you having any of the following symptoms?   Coughing up blood?  No  Weight loss of 15 lbs or more in the last 6 months without trying / you cannot explain  No  It looks like you meet all criteria.  When would be a good time for us  to schedule you for this screening?   Additional information: N/A

## 2023-08-14 ENCOUNTER — Encounter: Payer: Self-pay | Admitting: Family Medicine

## 2023-08-14 ENCOUNTER — Ambulatory Visit (INDEPENDENT_AMBULATORY_CARE_PROVIDER_SITE_OTHER): Payer: 59 | Admitting: Family Medicine

## 2023-08-14 VITALS — BP 118/76 | HR 63 | Temp 98.0°F | Ht 66.5 in | Wt 150.1 lb

## 2023-08-14 DIAGNOSIS — Z1211 Encounter for screening for malignant neoplasm of colon: Secondary | ICD-10-CM

## 2023-08-14 DIAGNOSIS — Z0000 Encounter for general adult medical examination without abnormal findings: Secondary | ICD-10-CM

## 2023-08-14 DIAGNOSIS — E0590 Thyrotoxicosis, unspecified without thyrotoxic crisis or storm: Secondary | ICD-10-CM

## 2023-08-14 DIAGNOSIS — J4550 Severe persistent asthma, uncomplicated: Secondary | ICD-10-CM | POA: Diagnosis not present

## 2023-08-14 DIAGNOSIS — J302 Other seasonal allergic rhinitis: Secondary | ICD-10-CM | POA: Diagnosis not present

## 2023-08-14 DIAGNOSIS — I25111 Atherosclerotic heart disease of native coronary artery with angina pectoris with documented spasm: Secondary | ICD-10-CM

## 2023-08-14 DIAGNOSIS — F17200 Nicotine dependence, unspecified, uncomplicated: Secondary | ICD-10-CM

## 2023-08-14 DIAGNOSIS — J432 Centrilobular emphysema: Secondary | ICD-10-CM | POA: Diagnosis not present

## 2023-08-14 DIAGNOSIS — R251 Tremor, unspecified: Secondary | ICD-10-CM | POA: Insufficient documentation

## 2023-08-14 DIAGNOSIS — E1169 Type 2 diabetes mellitus with other specified complication: Secondary | ICD-10-CM

## 2023-08-14 MED ORDER — FLUTICASONE PROPIONATE 50 MCG/ACT NA SUSP: NASAL | 12 refills | Status: AC

## 2023-08-14 MED ORDER — ALBUTEROL SULFATE HFA 108 (90 BASE) MCG/ACT IN AERS: 1.0000 | INHALATION_SPRAY | Freq: Four times a day (QID) | RESPIRATORY_TRACT | 3 refills | Status: DC | PRN

## 2023-08-14 MED ORDER — SYMBICORT 160-4.5 MCG/ACT IN AERO: 2.0000 | INHALATION_SPRAY | Freq: Two times a day (BID) | RESPIRATORY_TRACT | 11 refills | Status: DC

## 2023-08-14 MED ORDER — SPIRIVA RESPIMAT 2.5 MCG/ACT IN AERS: 2.0000 | INHALATION_SPRAY | Freq: Every day | RESPIRATORY_TRACT | 12 refills | Status: DC

## 2023-08-14 NOTE — Assessment & Plan Note (Signed)
 See above

## 2023-08-14 NOTE — Assessment & Plan Note (Signed)
 H/o this. TFTs along with anti-TPO and anti-TSHr normal 08/2023.

## 2023-08-14 NOTE — Assessment & Plan Note (Signed)
 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  Discussed beta blocker - she declines concerned it may cause hypotension.

## 2023-08-14 NOTE — Patient Instructions (Addendum)
 If interested, check with pharmacy about new 2 shot shingles series (shingrix).  We will sign you up for Colog[employer]ard Watch added sugar in the diet.  We will await mammogram, GYN exam, lung cancer screen.  Good to see you today  Return as needed or in 1 year for next physical

## 2023-08-14 NOTE — Assessment & Plan Note (Addendum)
 Preventative protocols reviewed and updated unless pt declined. Discussed healthy diet and lifestyle.  To consider shingrix shot.

## 2023-08-14 NOTE — Assessment & Plan Note (Signed)
 Chronic, stable period on spiriva and symbicort. Continued smoker - encouraged full cessation.

## 2023-08-14 NOTE — Assessment & Plan Note (Signed)
 Continued smoker - encouraged full cessation.  Pending lung cancer screening CT next month.

## 2023-08-14 NOTE — Progress Notes (Signed)
 Ph: [phone] Fax: [phone]   Patient ID: Makayla Green, female    DOB: 1970/11/13, 53 y.o.   MRN: [id number]  This visit was conducted in person.  BP 118/76   Pulse 63   Temp 98 F (36.7 C) (Oral)   Ht 5' 6.5 (1.689 m)   Wt 150 lb 2 oz (68.1 kg)   SpO2 98%   BMI 23.87 kg/m    CC: CPE Subjective:   HPI: Makayla Green is a 53 y.o. female presenting on 08/14/2023 for Annual Exam   Has 2 marmoset monkeys at home.   Asthma/COPD - on spiriva  and symbicort  as well as flonase , allegra , albuterol  inhaler PRN. Had COVID infections 07/2019, 03/2022. intermittent predisone use, latest 05/2023.  Singulair  caused dyspnea so now off this.  Smoking - 1/2 ppd.  Notes chronic R sinus congestion, attributes to bone spur from remote maxillary fracture when son was 4yo   Notes R>L hand shaking since age 50yo. No caffeine intake. Rare albuterol  use. No trouble walking. No memory trouble.   Preventative: Colon cancer screening - overdue. Hasn't completed Cologuard/iFOB but has this at home.  Breast cancer screening - overdue. Has upcoming appt 09/09/2023.  Well woman exam - last pap normal 2015 with prior PCP. Pending [facility] appt 09/09/2023.  LMP ~2015. Was on depo shots for 10 yrs (2005-2015). Early menopause.  DEXA scan - rec sooner start given smoker and depo use.  Lung cancer screening - scheduled for next month (09/09/2023).  Flu shot yearly COVID vaccine - declined Tdap 03/2021 Prevnar-20 - 08/2022 Shingrix - discussed.  Seat belt use discussed  Sunscreen use discussed. No changing moles on skin.  Smoking - 1/2+ ppd, started age 52yo, about 59 PY hx  Sleep - averaging 8-9 hours/night Alcohol - none  Dentist - q6 mo Eye exam - yearly   Married. Lives with husband, 2 sons and FIL 3 children, 1 grandchild. Occ: works in [employer].  Activity - no regular exercise Diet - good water, milk, fruits/vegetables daily      Relevant past medical, surgical,  family and social history reviewed and updated as indicated. Interim medical history since our last visit reviewed. Allergies and medications reviewed and updated. Outpatient Medications Prior to Visit  Medication Sig Dispense Refill   aspirin  EC 81 MG tablet Take 1 tablet (81 mg total) by mouth daily. 90 tablet 3   Cholecalciferol (VITAMIN D3) 25 MCG (1000 UT) CAPS Take 1 capsule (1,000 Units total) by mouth daily. 30 capsule    fexofenadine  (ALLEGRA  ALLERGY) 180 MG tablet Take 1 tablet (180 mg total) by mouth daily as needed for allergies or rhinitis.     ipratropium (ATROVENT ) 0.03 % nasal spray PLACE 2 SPRAYS INTO BOTH NOSTRILS EVERY 12 HOURS 30 mL 2   isosorbide  mononitrate (IMDUR ) 30 MG 24 hr tablet Take 1 tablet (30 mg total) by mouth daily. 90 tablet 3   rosuvastatin  (CRESTOR ) 5 MG tablet TAKE 1 TABLET BY MOUTH DAILY 90 tablet 2   albuterol  (VENTOLIN  HFA) 108 (90 Base) MCG/ACT inhaler INHALE 1-2 PUFFS INOT THE LUNGS EVERY 6 HOURS AS NEEDED FOR WHEEZING OR SHORTESS OF BREATH 8.5 each 3   fluticasone  (FLONASE ) 50 MCG/ACT nasal spray SPRAY 2 SPRAYS INTO EACH NOSTRIL EVERY DAY 16 mL 6   SYMBICORT  160-4.5 MCG/ACT inhaler Inhale 2 puffs into the lungs in the morning and at bedtime. 10.2 g 11   Tiotropium Bromide Monohydrate  (SPIRIVA  RESPIMAT) 2.5 MCG/ACT AERS Take 2  puffs by mouth daily. 4 g 11   benzonatate  (TESSALON ) 100 MG capsule Take 1 capsule (100 mg total) by mouth 3 (three) times daily as needed for cough. 30 capsule 0   doxycycline  (VIBRA -TABS) 100 MG tablet Take 1 tablet (100 mg total) by mouth 2 (two) times daily. 14 tablet 0   predniSONE  (DELTASONE ) 20 MG tablet Take 2 tablets (40 mg total) by mouth daily with breakfast. 10 tablet 0   No facility-administered medications prior to visit.     Per HPI unless specifically indicated in ROS section below Review of Systems  Constitutional:  Negative for activity change, appetite change, chills, fatigue, fever and unexpected weight  change.  HENT:  Negative for hearing loss.   Eyes:  Negative for visual disturbance.  Respiratory:  Negative for cough, chest tightness, shortness of breath and wheezing.   Cardiovascular:  Negative for chest pain, palpitations and leg swelling.  Gastrointestinal:  Negative for abdominal distention, abdominal pain, blood in stool, constipation, diarrhea, nausea and vomiting.  Genitourinary:  Negative for difficulty urinating and hematuria.  Musculoskeletal:  Negative for arthralgias, myalgias and neck pain.  Skin:  Negative for rash.  Neurological:  Negative for dizziness, seizures, syncope and headaches.  Hematological:  Negative for adenopathy. Does not bruise/bleed easily.  Psychiatric/Behavioral:  Negative for dysphoric mood. The patient is not nervous/anxious.     Objective:  BP 118/76   Pulse 63   Temp 98 F (36.7 C) (Oral)   Ht 5' 6.5 (1.689 m)   Wt 150 lb 2 oz (68.1 kg)   SpO2 98%   BMI 23.87 kg/m   Wt Readings from Last 3 Encounters:  08/14/23 150 lb 2 oz (68.1 kg)  12/26/22 152 lb 6.4 oz (69.1 kg)  08/08/22 154 lb 4 oz (70 kg)    Ht Readings from Last 3 Encounters:  08/14/23 5' 6.5 (1.689 m)  12/26/22 5' 8 (1.727 m)  08/08/22 5' 6.5 (1.689 m)     Physical Exam Vitals and nursing note reviewed.  Constitutional:      Appearance: Normal appearance. She is not ill-appearing.  HENT:     Head: Normocephalic and atraumatic.     Right Ear: Tympanic membrane, ear canal and external ear normal. There is no impacted cerumen.     Left Ear: Tympanic membrane, ear canal and external ear normal. There is no impacted cerumen.     Mouth/Throat:     Mouth: Mucous membranes are moist.     Pharynx: Oropharynx is clear. No oropharyngeal exudate or posterior oropharyngeal erythema.  Eyes:     General:        Right eye: No discharge.        Left eye: No discharge.     Extraocular Movements: Extraocular movements intact.     Conjunctiva/sclera: Conjunctivae normal.      Pupils: Pupils are equal, round, and reactive to light.  Neck:     Thyroid : No thyroid  mass or thyromegaly.  Cardiovascular:     Rate and Rhythm: Regular rhythm. Tachycardia present.     Pulses: Normal pulses.     Heart sounds: Normal heart sounds. No murmur heard. Pulmonary:     Effort: Pulmonary effort is normal. No respiratory distress.     Breath sounds: Normal breath sounds. No wheezing, rhonchi or rales.  Abdominal:     General: Bowel sounds are normal. There is no distension.     Palpations: Abdomen is soft. There is no mass.     Tenderness: There  is no abdominal tenderness. There is no guarding or rebound.     Hernia: No hernia is present.  Musculoskeletal:     Cervical back: Normal range of motion and neck supple. No rigidity.     Right lower leg: No edema.     Left lower leg: No edema.  Lymphadenopathy:     Cervical: No cervical adenopathy.  Skin:    General: Skin is warm and dry.     Findings: No rash.  Neurological:     General: No focal deficit present.     Mental Status: She is alert. Mental status is at baseline.     Comments: Postural tremor R>L hands  Psychiatric:        Mood and Affect: Mood normal.        Behavior: Behavior normal.       Results for orders placed or performed in visit on 08/07/23  TRAb (TSH Receptor Binding Antibody)   Collection Time: 08/07/23 10:17 AM  Result Value Ref Range   TRAB <1.00 <=2.00 IU/L  Thyroid  Peroxidase Antibodies (TPO) (REFL)-[facility]   Collection Time: 08/07/23 10:17 AM  Result Value Ref Range   Thyroperoxidase Ab SerPl-aCnc <1 <9 IU/mL  T4, Green   Collection Time: 08/07/23 10:17 AM  Result Value Ref Range   Green T4 1.02 0.60 - 1.60 ng/dL  T3   Collection Time: 08/07/23 10:17 AM  Result Value Ref Range   T3, Total 128 76 - 181 ng/dL  TSH   Collection Time: 08/07/23 10:17 AM  Result Value Ref Range   TSH 1.11 0.35 - 5.50 uIU/mL  Comprehensive metabolic panel   Collection Time: 08/07/23 10:17 AM  Result Value  Ref Range   Sodium 139 135 - 145 mEq/L   Potassium 5.0 3.5 - 5.1 mEq/L   Chloride 102 96 - 112 mEq/L   CO2 28 19 - 32 mEq/L   Glucose, Bld 100 (H) 70 - 99 mg/dL   BUN 19 6 - 23 mg/dL   Creatinine, Ser 9.38 0.40 - 1.20 mg/dL   Total Bilirubin 0.3 0.2 - 1.2 mg/dL   Alkaline Phosphatase 82 39 - 117 U/L   AST 16 0 - 37 U/L   ALT 18 0 - 35 U/L   Total Protein 7.5 6.0 - 8.3 g/dL   Albumin 4.7 3.5 - 5.2 g/dL   GFR 896.31 >39.99 mL/min   Calcium  10.4 8.4 - 10.5 mg/dL  Lipid panel   Collection Time: 08/07/23 10:17 AM  Result Value Ref Range   Cholesterol 182 0 - 200 mg/dL   Triglycerides 872.9 0.0 - 149.0 mg/dL   HDL 30.49 >60.99 mg/dL   VLDL 74.5 0.0 - 59.9 mg/dL   LDL Cholesterol 87 0 - 99 mg/dL   Total CHOL/HDL Ratio 3    NonHDL 112.64   Microalbumin / creatinine urine ratio   Collection Time: 08/07/23 10:17 AM  Result Value Ref Range   Microalb, Ur <0.7 0.0 - 1.9 mg/dL   Creatinine,U 64.8 mg/dL   Microalb Creat Ratio 2.0 0.0 - 30.0 mg/g  Hemoglobin A1c   Collection Time: 08/07/23 10:17 AM  Result Value Ref Range   Hgb A1c MFr Bld 6.7 (H) 4.6 - 6.5 %   Lab Results  Component Value Date   WBC 12.4 (H) 11/29/2020   HGB 15.1 (H) 11/29/2020   HCT 44.9 11/29/2020   MCV 96.3 11/29/2020   PLT 334.0 11/29/2020    Assessment & Plan:   Problem List Items Addressed This Visit  Health maintenance examination - Primary (Chronic)   Preventative protocols reviewed and updated unless pt declined. Discussed healthy diet and lifestyle.  To consider shingrix shot.      TOBACCO ABUSE   Continued smoker - encouraged full cessation.  Pending lung cancer screening CT next month.       Hyperthyroidism   H/o this. TFTs along with anti-TPO and anti-TSHr normal 08/2023.       Centrilobular emphysema (HCC)   Chronic, stable period on spiriva  and symbicort . Continued smoker - encouraged full cessation.       Relevant Medications   albuterol  (VENTOLIN  HFA) 108 (90 Base) MCG/ACT  inhaler   fluticasone  (FLONASE ) 50 MCG/ACT nasal spray   Tiotropium Bromide Monohydrate  (SPIRIVA  RESPIMAT) 2.5 MCG/ACT AERS   SYMBICORT  160-4.5 MCG/ACT inhaler   Coronary artery disease involving native coronary artery of native heart   Appreciate cards care- she is on imdur , aspirin , crestor .       Severe persistent asthma without complication   See above.       Relevant Medications   albuterol  (VENTOLIN  HFA) 108 (90 Base) MCG/ACT inhaler   Tiotropium Bromide Monohydrate  (SPIRIVA  RESPIMAT) 2.5 MCG/ACT AERS   SYMBICORT  160-4.5 MCG/ACT inhaler   Type 2 diabetes mellitus with other specified complication (HCC)   Remains diet controlled. Discussed possible smoking leading to mild polycythemia which could cause falsely elevated Hgb A1c.      Tremor   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  Discussed beta blocker - she declines concerned it may cause hypotension.       Other Visit Diagnoses       Seasonal allergic rhinitis, unspecified trigger       Relevant Medications   fluticasone  (FLONASE ) 50 MCG/ACT nasal spray     Special screening for malignant neoplasms, colon       Relevant Orders   Cologuard        Meds ordered this encounter  Medications   albuterol  (VENTOLIN  HFA) 108 (90 Base) MCG/ACT inhaler    Sig: Inhale 1-2 puffs into the lungs every 6 (six) hours as needed for wheezing or shortness of breath.    Dispense:  8.5 each    Refill:  3   fluticasone  (FLONASE ) 50 MCG/ACT nasal spray    Sig: SPRAY 2 SPRAYS INTO EACH NOSTRIL EVERY DAY    Dispense:  16 mL    Refill:  12   Tiotropium Bromide Monohydrate  (SPIRIVA  RESPIMAT) 2.5 MCG/ACT AERS    Sig: Take 2 puffs by mouth daily.    Dispense:  4 g    Refill:  12   SYMBICORT  160-4.5 MCG/ACT inhaler    Sig: Inhale 2 puffs into the lungs in the morning and at bedtime.    Dispense:  10.2 g    Refill:  11    Orders Placed This Encounter  Procedures   Cologuard    Patient Instructions  If interested, check with  pharmacy about new 2 shot shingles series (shingrix).  We will sign you up for Cologuard Watch added sugar in the diet.  We will await mammogram, GYN exam, lung cancer screen.  Good to see you today  Return as needed or in 1 year for next physical   Follow up plan: Return in about 1 year (around 08/13/2024) for annual exam, prior fasting for blood work.  Anton Blas, MD

## 2023-08-14 NOTE — Assessment & Plan Note (Signed)
 Remains diet controlled. Discussed possible smoking leading to mild polycythemia which could cause falsely elevated Hgb A1c.

## 2023-08-14 NOTE — Assessment & Plan Note (Signed)
 Appreciate cards care- she is on imdur, aspirin, crestor.

## 2023-08-20 ENCOUNTER — Other Ambulatory Visit: Payer: Self-pay | Admitting: Family Medicine

## 2023-08-20 DIAGNOSIS — Z1231 Encounter for screening mammogram for malignant neoplasm of breast: Secondary | ICD-10-CM

## 2023-09-02 ENCOUNTER — Ambulatory Visit (INDEPENDENT_AMBULATORY_CARE_PROVIDER_SITE_OTHER): Payer: 59 | Admitting: Acute Care

## 2023-09-02 DIAGNOSIS — F17210 Nicotine dependence, cigarettes, uncomplicated: Secondary | ICD-10-CM

## 2023-09-02 NOTE — Patient Instructions (Signed)

## 2023-09-02 NOTE — Progress Notes (Addendum)
 Provider Attestation I agree with the documentation of the Shared Decision Making visit,  smoking cessation counseling if appropriate, and verification or eligibility for lung cancer screening as documented by the RN Nurse Navigator.   Raejean Bullock, MSN, AGACNP-BC [facility] See Amion for personal pager PCCM on call pager [phone]     Virtual Visit via Telephone Note  I connected with Makayla Green on 09/02/23 at  9:00 AM EST by telephone and verified that I am speaking with the correct person using two identifiers.  Makayla Green  Provider: Alyse Bach   I discussed the limitations, risks, security and privacy concerns of performing an evaluation and management service by telephone and the availability of in person appointments. I also discussed with the patient that there may be a patient responsible charge related to this service. The patient expressed understanding and agreed to proceed.   Shared Decision Making Visit Lung Cancer Screening Program [phone])   Eligibility: Age 24 y.o. Pack Years Smoking History Calculation 44 (# packs/per year x # years smoked) Recent History of coughing up blood  no Unexplained weight loss? no ( >Than 15 pounds within the last 6 months ) Prior History Lung / other cancer no (Diagnosis within the last 5 years already requiring surveillance chest CT Scans). Smoking Status Current Smoker Former Smokers: Years since quit: n/a  Quit Date: n/a  Visit Components: Discussion included one or more decision making aids. yes Discussion included risk/benefits of screening. yes Discussion included potential follow up diagnostic testing for abnormal scans. yes Discussion included meaning and risk of over diagnosis. yes Discussion included meaning and risk of False Positives. yes Discussion included meaning of total radiation exposure. yes  Counseling Included: Importance of adherence to annual  lung cancer LDCT screening. yes Impact of comorbidities on ability to participate in the program. yes Ability and willingness to under diagnostic treatment. yes  Smoking Cessation Counseling: Current Smokers:  Discussed importance of smoking cessation. yes Information about tobacco cessation classes and interventions provided to patient. yes Patient provided with "ticket" for LDCT Scan. no Symptomatic Patient. no  Counseling(Intermediate counseling: > three minutes) 99406 Diagnosis Code: Tobacco Use Z72.0 Asymptomatic Patient yes  Counseling (Intermediate counseling: > three minutes counseling) [id number] Former Smokers:  Discussed the importance of maintaining cigarette abstinence. yes Diagnosis Code: Personal History of Nicotine  Dependence. N62.952 Information about tobacco cessation classes and interventions provided to patient. Yes Patient provided with "ticket" for LDCT Scan. no Written Order for Lung Cancer Screening with LDCT placed in Epic. Yes (CT Chest Lung Cancer Screening Low Dose W/O CM) [id number] Z12.2-Screening of respiratory organs Z87.891-Personal history of nicotine  dependence   Alyse Bach, RN

## 2023-09-09 ENCOUNTER — Ambulatory Visit: Payer: 59

## 2023-09-09 ENCOUNTER — Ambulatory Visit
Admission: RE | Admit: 2023-09-09 | Discharge: 2023-09-09 | Disposition: A | Discharge status: Home / Self Care | Payer: 59 | Source: Ambulatory Visit | Attending: Family Medicine | Admitting: Family Medicine

## 2023-09-09 DIAGNOSIS — F17210 Nicotine dependence, cigarettes, uncomplicated: Secondary | ICD-10-CM | POA: Insufficient documentation

## 2023-09-09 DIAGNOSIS — Z87891 Personal history of nicotine dependence: Secondary | ICD-10-CM | POA: Diagnosis present

## 2023-09-09 DIAGNOSIS — Z122 Encounter for screening for malignant neoplasm of respiratory organs: Secondary | ICD-10-CM | POA: Insufficient documentation

## 2023-09-09 DIAGNOSIS — Z1231 Encounter for screening mammogram for malignant neoplasm of breast: Secondary | ICD-10-CM

## 2023-09-17 ENCOUNTER — Encounter: Payer: Self-pay | Admitting: Family Medicine

## 2023-09-21 ENCOUNTER — Other Ambulatory Visit: Payer: Self-pay | Admitting: Acute Care

## 2023-09-21 ENCOUNTER — Telehealth: Payer: 59 | Admitting: Physician Assistant

## 2023-09-21 DIAGNOSIS — J019 Acute sinusitis, unspecified: Secondary | ICD-10-CM | POA: Diagnosis not present

## 2023-09-21 DIAGNOSIS — J40 Bronchitis, not specified as acute or chronic: Secondary | ICD-10-CM

## 2023-09-21 DIAGNOSIS — Z87891 Personal history of nicotine dependence: Secondary | ICD-10-CM

## 2023-09-21 DIAGNOSIS — B9689 Other specified bacterial agents as the cause of diseases classified elsewhere: Secondary | ICD-10-CM

## 2023-09-21 DIAGNOSIS — Z122 Encounter for screening for malignant neoplasm of respiratory organs: Secondary | ICD-10-CM

## 2023-09-21 MED ORDER — DOXYCYCLINE HYCLATE 100 MG PO TABS: 100.0000 mg | ORAL_TABLET | Freq: Two times a day (BID) | ORAL | 0 refills | Status: DC

## 2023-09-21 MED ORDER — BENZONATATE 100 MG PO CAPS: 100.0000 mg | ORAL_CAPSULE | Freq: Three times a day (TID) | ORAL | 0 refills | Status: DC | PRN

## 2023-09-21 MED ORDER — PREDNISONE 10 MG (21) PO TBPK: ORAL_TABLET | ORAL | 0 refills | Status: DC

## 2023-09-21 NOTE — Progress Notes (Signed)
E-Visit for Cough   We are sorry that you are not feeling well.  Here is how we plan to help!  Based on your presentation I believe you most likely have  A cough due to bacteria.  When patients have a fever and a productive cough with a change in color or increased sputum production, we are concerned about bacterial bronchitis.  If left untreated it can progress to pneumonia.  If your symptoms do not improve with your treatment plan it is important that you contact your provider.   I have prescribed Doxycycline 100 mg twice a day for 10 days    In addition you may use A non-prescription cough medication called Mucinex DM: take 2 tablets every 12 hours. and A prescription cough medication called Tessalon Perles 100mg . You may take 1-2 capsules every 8 hours as needed for your cough.  Prednisone 10 mg daily for 6 days (see taper instructions below)  Directions for 6 day taper: Day 1: 2 tablets before breakfast, 1 after both lunch & dinner and 2 at bedtime Day 2: 1 tab before breakfast, 1 after both lunch & dinner and 2 at bedtime Day 3: 1 tab at each meal & 1 at bedtime Day 4: 1 tab at breakfast, 1 at lunch, 1 at bedtime Day 5: 1 tab at breakfast & 1 tab at bedtime Day 6: 1 tab at breakfast  From your responses in the eVisit questionnaire you describe inflammation in the upper respiratory tract which is causing a significant cough.  This is commonly called Bronchitis and has four common causes:   Allergies Viral Infections Acid Reflux Bacterial Infection Allergies, viruses and acid reflux are treated by controlling symptoms or eliminating the cause. An example might be a cough caused by taking certain blood pressure medications. You stop the cough by changing the medication. Another example might be a cough caused by acid reflux. Controlling the reflux helps control the cough.  USE OF BRONCHODILATOR ("RESCUE") INHALERS: There is a risk from using your bronchodilator too frequently.  The risk  is that over-reliance on a medication which only relaxes the muscles surrounding the breathing tubes can reduce the effectiveness of medications prescribed to reduce swelling and congestion of the tubes themselves.  Although you feel brief relief from the bronchodilator inhaler, your asthma may actually be worsening with the tubes becoming more swollen and filled with mucus.  This can delay other crucial treatments, such as oral steroid medications. If you need to use a bronchodilator inhaler daily, several times per day, you should discuss this with your provider.  There are probably better treatments that could be used to keep your asthma under control.     HOME CARE Only take medications as instructed by your medical team. Complete the entire course of an antibiotic. Drink plenty of fluids and get plenty of rest. Avoid close contacts especially the very young and the elderly Cover your mouth if you cough or cough into your sleeve. Always remember to wash your hands A steam or ultrasonic humidifier can help congestion.   GET HELP RIGHT AWAY IF: You develop worsening fever. You become short of breath You cough up blood. Your symptoms persist after you have completed your treatment plan MAKE SURE YOU  Understand these instructions. Will watch your condition. Will get help right away if you are not doing well or get worse.    Thank you for choosing an e-visit.  Your e-visit answers were reviewed by a board certified advanced clinical  practitioner to complete your personal care plan. Depending upon the condition, your plan could have included both over the counter or prescription medications.  Please review your pharmacy choice. Make sure the pharmacy is open so you can pick up prescription now. If there is a problem, you may contact your provider through [employer] and have the prescription routed to another pharmacy.  Your safety is important to [place]. If you have drug allergies check  your prescription carefully.   For the next 24 hours you can use MyChart to ask questions about today's visit, request a non-urgent call back, or ask for a work or school excuse. You will get an email in the next two days asking about your experience. I hope that your e-visit has been valuable and will speed your recovery.   I have spent 5 minutes in review of e-visit questionnaire, review and updating patient chart, medical decision making and response to patient.   Margaretann Loveless, PA-C

## 2023-10-08 ENCOUNTER — Ambulatory Visit: Payer: 59

## 2023-12-11 ENCOUNTER — Other Ambulatory Visit: Payer: Self-pay | Admitting: Cardiovascular Disease

## 2023-12-14 MED ORDER — ROSUVASTATIN CALCIUM 5 MG PO TABS: 5.0000 mg | ORAL_TABLET | Freq: Every day | ORAL | 0 refills | Status: DC

## 2023-12-14 NOTE — Addendum Note (Signed)
 Addended by: Debrah Fan, Cierah Crader L on: 12/14/2023 08:35 AM   Modules accepted: Orders

## 2023-12-20 ENCOUNTER — Other Ambulatory Visit: Payer: Self-pay | Admitting: General Practice

## 2023-12-30 ENCOUNTER — Ambulatory Visit: Payer: 59 | Attending: Cardiovascular Disease | Admitting: Cardiovascular Disease

## 2023-12-30 ENCOUNTER — Encounter: Payer: Self-pay | Admitting: Cardiovascular Disease

## 2023-12-30 VITALS — BP 118/78 | HR 89 | Ht 68.0 in | Wt 155.0 lb

## 2023-12-30 DIAGNOSIS — E785 Hyperlipidemia, unspecified: Secondary | ICD-10-CM

## 2023-12-30 DIAGNOSIS — I25111 Atherosclerotic heart disease of native coronary artery with angina pectoris with documented spasm: Secondary | ICD-10-CM | POA: Diagnosis not present

## 2023-12-30 DIAGNOSIS — F17200 Nicotine dependence, unspecified, uncomplicated: Secondary | ICD-10-CM

## 2023-12-30 MED ORDER — ROSUVASTATIN CALCIUM 10 MG PO TABS: 10.0000 mg | ORAL_TABLET | Freq: Every day | ORAL | 3 refills | Status: AC

## 2023-12-30 NOTE — Progress Notes (Signed)
 Cardiology Office Note   Date:  12/30/2023   ID:  Makayla Green, DOB [birth year], MRN [id number]  PCP:  Claire Crick, MD  Cardiologist:  Odesser Tourangeau Electrophysiologist:  None   Evaluation Performed:  Follow-Up Visit  Chief Complaint: Chest pain  History of Present Illness:    Makayla Green is a 53 y.o. female with history of premature onset CAD (prominent coronary calcification on CT but only minor stenoses on coronary angiography April 2018), ongoing tobacco abuse, nitrate responsive chest pain possibly due to coronary spasm.  She has not required any sublingual nitroglycerin in over a year.  Seems to have good control of coronary vasospasm related angina (mostly occurs at night) as long as she takes isosorbide  mononitrate 30 mg tablet before bedtime.  Her angina in the past was pain in her left shoulder radiating towards the left scapula.  No other cardiovascular complaints. The patient specifically denies any chest pain with exertion, dyspnea at rest or with exertion, orthopnea, paroxysmal nocturnal dyspnea, syncope, palpitations, focal neurological deficits, intermittent claudication, lower extremity edema, unexplained weight gain, cough, hemoptysis or wheezing.  She now has 2 pet [employer].  Has reduced smoking down to a pack of cigarettes a day. She has tried multiple times to quit, including using Chantix , Wellbutrin, nicotine  supplements, each time unsuccessfully due to severe mood swings.  She is allergic to the nicotine  patches and is thinking of using the nicotine  inhaler.  She tried vaping and developed bronchitis that was very hard to cure.      Past Medical History:  Diagnosis Date   Abnormal pap    LEEP 2005   Asthma    Coronary artery disease    COVID-19 virus infection 03/18/2022   History of Bell's palsy 1990s   per pt   Recurrent upper respiratory infection (URI)    Solitary pulmonary nodule 07/06/2014    4 mm pulmonary nodule seen on  November 2015 CT chest    Tobacco use disorder    Past Surgical History:  Procedure Laterality Date   CARDIAC CATHETERIZATION N/A 11/14/2015   Procedure: Left Heart Cath and Coronary Angiography;  Surgeon: Wenona Hamilton, MD;  Location: [facility];  Service: Cardiovascular;  Laterality: N/A;     Current Meds  Medication Sig   albuterol  (VENTOLIN  HFA) 108 (90 Base) MCG/ACT inhaler Inhale 1-2 puffs into the lungs every 6 (six) hours as needed for wheezing or shortness of breath.   aspirin  EC 81 MG tablet Take 1 tablet (81 mg total) by mouth daily.   benzonatate  (TESSALON ) 100 MG capsule Take 1-2 capsules (100-200 mg total) by mouth 3 (three) times daily as needed.   Cholecalciferol (VITAMIN D3) 25 MCG (1000 UT) CAPS Take 1 capsule (1,000 Units total) by mouth daily.   fexofenadine  (ALLEGRA  ALLERGY) 180 MG tablet Take 1 tablet (180 mg total) by mouth daily as needed for allergies or rhinitis.   fluticasone  (FLONASE ) 50 MCG/ACT nasal spray SPRAY 2 SPRAYS INTO EACH NOSTRIL EVERY DAY   ipratropium (ATROVENT ) 0.03 % nasal spray PLACE 2 SPRAYS INTO BOTH NOSTRILS EVERY 12 HOURS   isosorbide  mononitrate (IMDUR ) 30 MG 24 hr tablet TAKE 1 TABLET BY MOUTH DAILY   rosuvastatin  (CRESTOR ) 10 MG tablet Take 1 tablet (10 mg total) by mouth daily.   SYMBICORT  160-4.5 MCG/ACT inhaler Inhale 2 puffs into the lungs in the morning and at bedtime.   Tiotropium Bromide Monohydrate  (SPIRIVA  RESPIMAT) 2.5 MCG/ACT AERS Take 2 puffs by mouth daily.   [  DISCONTINUED] rosuvastatin  (CRESTOR ) 5 MG tablet Take 1 tablet (5 mg total) by mouth daily.     Allergies:   Penicillins, Amitriptyline, Methylprednisolone acetate, and Shellfish allergy   Social History   Tobacco Use   Smoking status: Every Day    Current packs/day: 1.25    Average packs/day: 1.3 packs/day for 35.4 years (44.3 ttl pk-yrs)    Types: Cigarettes    Start date: 60   Smokeless tobacco: Never  Vaping Use   Vaping status: Never Used   Substance Use Topics   Alcohol use: No    Alcohol/week: 0.0 standard drinks of alcohol   Drug use: No     Family Hx: The patient's family history includes COPD in her father and mother; Cancer in her mother; Heart disease in her mother; Lung cancer in her father; Neuropathy in her father. There is no history of Thyroid  disease.   Prior CV studies:   The following studies were reviewed today:  2017 coronary angiography 2021 arrhythmia monitor  Labs/Other Tests and Data Reviewed:    EKG:     EKG Interpretation Date/Time:  Wednesday Dec 30 2023 09:39:09 EDT Ventricular Rate:  89 PR Interval:  132 QRS Duration:  94 QT Interval:  360 QTC Calculation: 438 R Axis:   77  Text Interpretation: Normal sinus rhythm Right atrial enlargement Incomplete right bundle branch block Possible Right ventricular hypertrophy No previous ECGs available Confirmed by Eligah Anello ([id number]) on 12/30/2023 9:52:58 AM        Recent Labs: 08/07/2023: ALT 18; BUN 19; Creatinine, Ser 0.61; Potassium 5.0; Sodium 139; TSH 1.11   Recent Lipid Panel Lab Results  Component Value Date/Time   CHOL 182 08/07/2023 10:17 AM   TRIG 127.0 08/07/2023 10:17 AM   HDL 69.50 08/07/2023 10:17 AM   CHOLHDL 3 08/07/2023 10:17 AM   LDLCALC 87 08/07/2023 10:17 AM   LDLCALC 83 04/01/2019 03:01 PM    Wt Readings from Last 3 Encounters:  12/30/23 70.3 kg  09/09/23 68 kg  08/14/23 68.1 kg     Objective:    Vital Signs:  BP 118/78   Pulse 89   Ht 5\' 8"  (1.727 m)   Wt 70.3 kg   SpO2 95%   BMI 23.57 kg/m      General: Alert, oriented x3, no distress, lean Head: no evidence of trauma, PERRL, EOMI, no exophtalmos or lid lag, no myxedema, no xanthelasma; normal ears, nose and oropharynx Neck: normal jugular venous pulsations and no hepatojugular reflux; brisk carotid pulses without delay and no carotid bruits Chest: clear to auscultation, no signs of consolidation by percussion or palpation, normal fremitus,  symmetrical and full respiratory excursions Cardiovascular: normal position and quality of the apical impulse, regular rhythm, normal first and second heart sounds, no murmurs, rubs or gallops Abdomen: no tenderness or distention, no masses by palpation, no abnormal pulsatility or arterial bruits, normal bowel sounds, no hepatosplenomegaly Extremities: no clubbing, cyanosis or edema; 2+ radial, ulnar and brachial pulses bilaterally; 2+ right femoral, posterior tibial and dorsalis pedis pulses; 2+ left femoral, posterior tibial and dorsalis pedis pulses; no subclavian or femoral bruits Neurological: grossly nonfocal Psych: Normal mood and affect    ASSESSMENT & PLAN:    Angina pectoris: Well-controlled on a low-dose of isosorbide  mononitrate which she takes in the evenings. HLP: Not at target LDL less than 70.  Increase rosuvastatin  to 10 mg daily. Tobacco abuse: Still smoking a pack of cigarettes a day.  Most likely driver for her coronary  vasospasm.  Continue efforts to quit smoking.  Discussed behavioral changes to help achieve this goal.   Patient Instructions  Medication Instructions:  Your physician has recommended you make the following change in your medication:  INCREASE CRESTOR  TO 10 MG DAILY.  *If you need a refill on your cardiac medications before your next appointment, please call your pharmacy*  Lab Work: NONE If you have labs (blood work) drawn today and your tests are completely normal, you will receive your results only by: MyChart Message (if you have MyChart) OR A paper copy in the mail If you have any lab test that is abnormal or we need to change your treatment, we will call you to review the results.  Testing/Procedures: NONE  Follow-Up: At [facility], you and your health needs are our priority.  As part of our continuing mission to provide you with exceptional heart care, our providers are all part of one team.  This team includes your primary  Cardiologist (physician) and Advanced Practice Providers or APPs (Physician Assistants and Nurse Practitioners) who all work together to provide you with the care you need, when you need it.  Your next appointment:   1 year(s)  Provider:   One of our Advanced Practice Providers (APPs): Melita Springer, PA-C  Friddie Jetty, NP Evaline Hill, NP  Theotis Flake, PA-C Lawana Pray, NP  Willis Harter, PA-C Lovette Rud, PA-C  [place], PA-C Ernest Dick, NP  Marlana Silvan, NP Marcie Sever, PA-C  Laquita Plant, PA-C    Dayna Dunn, PA-C  Scott Weaver, PA-C Palmer Bobo, NP Katlyn West, NP Callie Goodrich, PA-C  Evan Williams, PA-C Sheng Haley, PA-C  Xika Zhao, NP Kathleen Johnson, PA-C   Then, Luana Rumple, MD will plan to see you again in 2 year(s).    We recommend signing up for the patient portal called "MyChart".  Sign up information is provided on this After Visit Summary.  MyChart is used to connect with patients for Virtual Visits (Telemedicine).  Patients are able to view lab/test results, encounter notes, upcoming appointments, etc.  Non-urgent messages can be sent to your provider as well.   To learn more about what you can do with MyChart, go to [email or web address].          Signed, Luana Rumple, MD  12/30/2023 9:57 AM    [facility]

## 2023-12-30 NOTE — Patient Instructions (Signed)
 Medication Instructions:  Your physician has recommended you make the following change in your medication:  INCREASE CRESTOR  TO 10 MG DAILY.  *If you need a refill on your cardiac medications before your next appointment, please call your pharmacy*  Lab Work: NONE If you have labs (blood work) drawn today and your tests are completely normal, you will receive your results only by: MyChart Message (if you have MyChart) OR A paper copy in the mail If you have any lab test that is abnormal or we need to change your treatment, we will call you to review the results.  Testing/Procedures: NONE  Follow-Up: At [facility], you and your health needs are our priority.  As part of our continuing mission to provide you with exceptional heart care, our providers are all part of one team.  This team includes your primary Cardiologist (physician) and Advanced Practice Providers or APPs (Physician Assistants and Nurse Practitioners) who all work together to provide you with the care you need, when you need it.  Your next appointment:   1 year(s)  Provider:   One of our Advanced Practice Providers (APPs): Melita Springer, PA-C  Friddie Jetty, NP Evaline Hill, NP  Theotis Flake, PA-C Lawana Pray, NP  Willis Harter, PA-C Lovette Rud, PA-C  [place], PA-C Ernest Dick, NP  Marlana Silvan, NP Marcie Sever, PA-C  Laquita Plant, PA-C    Dayna Dunn, PA-C  Scott Weaver, PA-C Palmer Bobo, NP Katlyn West, NP Callie Goodrich, PA-C  Evan Williams, PA-C Sheng Haley, PA-C  Xika Zhao, NP Kathleen Johnson, PA-C   Then, Luana Rumple, MD will plan to see you again in 2 year(s).    We recommend signing up for the patient portal called "MyChart".  Sign up information is provided on this After Visit Summary.  MyChart is used to connect with patients for Virtual Visits (Telemedicine).  Patients are able to view lab/test results, encounter notes, upcoming appointments, etc.  Non-urgent messages can be  sent to your provider as well.   To learn more about what you can do with MyChart, go to [email or web address].

## 2024-01-04 ENCOUNTER — Encounter: Payer: Self-pay | Admitting: Acute Care

## 2024-01-04 ENCOUNTER — Other Ambulatory Visit: Payer: Self-pay | Admitting: General Practice

## 2024-01-12 ENCOUNTER — Telehealth: Admitting: Emergency Medicine

## 2024-01-12 DIAGNOSIS — J40 Bronchitis, not specified as acute or chronic: Secondary | ICD-10-CM | POA: Diagnosis not present

## 2024-01-12 MED ORDER — BENZONATATE 100 MG PO CAPS: 100.0000 mg | ORAL_CAPSULE | Freq: Three times a day (TID) | ORAL | 0 refills | Status: AC | PRN

## 2024-01-12 MED ORDER — DOXYCYCLINE HYCLATE 100 MG PO TABS: 100.0000 mg | ORAL_TABLET | Freq: Two times a day (BID) | ORAL | 0 refills | Status: AC

## 2024-01-12 MED ORDER — PREDNISONE 20 MG PO TABS: 40.0000 mg | ORAL_TABLET | Freq: Every day | ORAL | 0 refills | Status: AC

## 2024-01-12 NOTE — Progress Notes (Signed)
 E-Visit for Cough   We are sorry that you are not feeling well.  Here is how we plan to help!  Based on your presentation I believe you most likely have A cough due to bacteria.  When patients have a fever and a productive cough with a change in color or increased sputum production, we are concerned about bacterial bronchitis.  If left untreated it can progress to pneumonia.  If your symptoms do not improve with your treatment plan it is important that you contact your provider.   I have prescribed Doxycycline  100 mg twice a day for 7 days     In addition you may use A prescription cough medication called Tessalon  Perles 100mg . You may take 1-2 capsules every 8 hours as needed for your cough.  I have also prescribed prednisone  as requested.   From your responses in the eVisit questionnaire you describe inflammation in the upper respiratory tract which is causing a significant cough.  This is commonly called Bronchitis and has four common causes:   Allergies Viral Infections Acid Reflux Bacterial Infection Allergies, viruses and acid reflux are treated by controlling symptoms or eliminating the cause. An example might be a cough caused by taking certain blood pressure medications. You stop the cough by changing the medication. Another example might be a cough caused by acid reflux. Controlling the reflux helps control the cough.  USE OF BRONCHODILATOR ("RESCUE") INHALERS: There is a risk from using your bronchodilator too frequently.  The risk is that over-reliance on a medication which only relaxes the muscles surrounding the breathing tubes can reduce the effectiveness of medications prescribed to reduce swelling and congestion of the tubes themselves.  Although you feel brief relief from the bronchodilator inhaler, your asthma may actually be worsening with the tubes becoming more swollen and filled with mucus.  This can delay other crucial treatments, such as oral steroid medications. If you  need to use a bronchodilator inhaler daily, several times per day, you should discuss this with your provider.  There are probably better treatments that could be used to keep your asthma under control.     HOME CARE Only take medications as instructed by your medical team. Complete the entire course of an antibiotic. Drink plenty of fluids and get plenty of rest. Avoid close contacts especially the very young and the elderly Cover your mouth if you cough or cough into your sleeve. Always remember to wash your hands A steam or ultrasonic humidifier can help congestion.   GET HELP RIGHT AWAY IF: You develop worsening fever. You become short of breath You cough up blood. Your symptoms persist after you have completed your treatment plan MAKE SURE YOU  Understand these instructions. Will watch your condition. Will get help right away if you are not doing well or get worse.    Thank you for choosing an e-visit.  Your e-visit answers were reviewed by a board certified advanced clinical practitioner to complete your personal care plan. Depending upon the condition, your plan could have included both over the counter or prescription medications.  Please review your pharmacy choice. Make sure the pharmacy is open so you can pick up prescription now. If there is a problem, you may contact your provider through [employer] and have the prescription routed to another pharmacy.  Your safety is important to us . If you have drug allergies check your prescription carefully.   For the next 24 hours you can use MyChart to ask questions about today's  visit, request a non-urgent call back, or ask for a work or school excuse. You will get an email in the next two days asking about your experience. I hope that your e-visit has been valuable and will speed your recovery.  I have spent 5 minutes in review of e-visit questionnaire, review and updating patient chart, medical decision making and response  to patient.   Bart Born, PhD, FNP-BC

## 2024-01-13 ENCOUNTER — Other Ambulatory Visit: Payer: Self-pay | Admitting: Family Medicine

## 2024-01-14 NOTE — Telephone Encounter (Signed)
 Ipratropium Last filled:  11/19/23, #30 mL Last OV:  08/14/23, CPE Next OV:  none

## 2024-01-15 NOTE — Telephone Encounter (Signed)
 ERx

## 2024-07-23 ENCOUNTER — Telehealth: Admitting: Family Medicine

## 2024-07-23 DIAGNOSIS — B9689 Other specified bacterial agents as the cause of diseases classified elsewhere: Secondary | ICD-10-CM | POA: Diagnosis not present

## 2024-07-23 DIAGNOSIS — J019 Acute sinusitis, unspecified: Secondary | ICD-10-CM

## 2024-07-23 DIAGNOSIS — J40 Bronchitis, not specified as acute or chronic: Secondary | ICD-10-CM | POA: Diagnosis not present

## 2024-07-23 MED ORDER — AZITHROMYCIN 250 MG PO TABS: ORAL_TABLET | ORAL | 0 refills | Status: AC

## 2024-07-23 MED ORDER — BENZONATATE 200 MG PO CAPS: 200.0000 mg | ORAL_CAPSULE | Freq: Two times a day (BID) | ORAL | 0 refills | Status: AC | PRN

## 2024-07-23 MED ORDER — PREDNISONE 20 MG PO TABS: 20.0000 mg | ORAL_TABLET | Freq: Two times a day (BID) | ORAL | 0 refills | Status: AC

## 2024-07-23 NOTE — Progress Notes (Signed)
 We are sorry that you are not feeling well.  Here is how we plan to help!  Based on your presentation I believe you most likely have A cough due to bacteria.  When patients have a fever and a productive cough with a change in color or increased sputum production, we are concerned about bacterial bronchitis.  If left untreated it can progress to pneumonia.  If your symptoms do not improve with your treatment plan it is important that you contact your provider.   I have prescribed Azithromyin 250 mg: two tablets now and then one tablet daily for 4 additonal days    In addition you may use A prescription cough medication called Tessalon  Perles 100mg . You may take 1-2 capsules every 8 hours as needed for your cough.  I also sent prednisone   From your responses in the eVisit questionnaire you describe inflammation in the upper respiratory tract which is causing a significant cough.  This is commonly called Bronchitis and has four common causes:   Allergies Viral Infections Acid Reflux Bacterial Infection Allergies, viruses and acid reflux are treated by controlling symptoms or eliminating the cause. An example might be a cough caused by taking certain blood pressure medications. You stop the cough by changing the medication. Another example might be a cough caused by acid reflux. Controlling the reflux helps control the cough.  USE OF BRONCHODILATOR (RESCUE) INHALERS: There is a risk from using your bronchodilator too frequently.  The risk is that over-reliance on a medication which only relaxes the muscles surrounding the breathing tubes can reduce the effectiveness of medications prescribed to reduce swelling and congestion of the tubes themselves.  Although you feel brief relief from the bronchodilator inhaler, your asthma may actually be worsening with the tubes becoming more swollen and filled with mucus.  This can delay other crucial treatments, such as oral steroid medications. If you need to use  a bronchodilator inhaler daily, several times per day, you should discuss this with your provider.  There are probably better treatments that could be used to keep your asthma under control.     HOME CARE Only take medications as instructed by your medical team. Complete the entire course of an antibiotic. Drink plenty of fluids and get plenty of rest. Avoid close contacts especially the very young and the elderly Cover your mouth if you cough or cough into your sleeve. Always remember to wash your hands A steam or ultrasonic humidifier can help congestion.   GET HELP RIGHT AWAY IF: You develop worsening fever. You become short of breath You cough up blood. Your symptoms persist after you have completed your treatment plan MAKE SURE YOU  Understand these instructions. Will watch your condition. Will get help right away if you are not doing well or get worse.  Your e-visit answers were reviewed by a board certified advanced clinical practitioner to complete your personal care plan.  Depending on the condition, your plan could have included both over the counter or prescription medications. If there is a problem please reply  once you have received a response from your provider. Your safety is important to us .  If you have drug allergies check your prescription carefully.    You can use MyChart to ask questions about todays visit, request a non-urgent call back, or ask for a work or school excuse for 24 hours related to this e-Visit. If it has been greater than 24 hours you will need to follow up with your provider, or  enter a new e-Visit to address those concerns. You will get an e-mail in the next two days asking about your experience.  I hope that your e-visit has been valuable and will speed your recovery. Thank you for using e-visits.   I have spent 5 minutes in review of e-visit questionnaire, review and updating patient chart, medical decision making and response to patient.    Trellis Guirguis, FNP

## 2024-08-20 ENCOUNTER — Other Ambulatory Visit: Payer: Self-pay | Admitting: Family Medicine

## 2024-08-20 DIAGNOSIS — J4550 Severe persistent asthma, uncomplicated: Secondary | ICD-10-CM

## 2024-08-22 NOTE — Telephone Encounter (Signed)
 E-scribed refill.  Pls schedule CPE and fasting labs for additional refills.

## 2024-08-26 ENCOUNTER — Other Ambulatory Visit: Payer: Self-pay | Admitting: Family Medicine

## 2024-08-26 DIAGNOSIS — J432 Centrilobular emphysema: Secondary | ICD-10-CM

## 2024-08-26 NOTE — Telephone Encounter (Addendum)
 E-scribed refill.  Pls schedule CPE and fasting labs for additional refills.

## 2024-08-31 ENCOUNTER — Other Ambulatory Visit: Payer: Self-pay | Admitting: Family Medicine

## 2024-08-31 DIAGNOSIS — J432 Centrilobular emphysema: Secondary | ICD-10-CM

## 2024-08-31 NOTE — Telephone Encounter (Signed)
 E-scribed refills.  Pls schedule CPE and fasting labs for additional refills.

## 2024-09-01 NOTE — Telephone Encounter (Signed)
 lvm for pt to call office to schedule appt.

## 2024-11-07 ENCOUNTER — Encounter: Admitting: Family Medicine
